# Patient Record
Sex: Female | Born: 1945 | Race: White | Hispanic: No | Marital: Married | Smoking: Former smoker
Health system: Southern US, Community
[De-identification: ages and names within clinical notes are randomized; demographics above are authoritative.]

## PROBLEM LIST (undated history)

## (undated) DIAGNOSIS — K219 Gastro-esophageal reflux disease without esophagitis: Secondary | ICD-10-CM

## (undated) DIAGNOSIS — Z972 Presence of dental prosthetic device (complete) (partial): Secondary | ICD-10-CM

## (undated) DIAGNOSIS — Z923 Personal history of irradiation: Secondary | ICD-10-CM

## (undated) DIAGNOSIS — R569 Unspecified convulsions: Secondary | ICD-10-CM

## (undated) DIAGNOSIS — J189 Pneumonia, unspecified organism: Secondary | ICD-10-CM

## (undated) DIAGNOSIS — M1990 Unspecified osteoarthritis, unspecified site: Secondary | ICD-10-CM

## (undated) DIAGNOSIS — C50919 Malignant neoplasm of unspecified site of unspecified female breast: Secondary | ICD-10-CM

## (undated) DIAGNOSIS — Z973 Presence of spectacles and contact lenses: Secondary | ICD-10-CM

## (undated) DIAGNOSIS — H409 Unspecified glaucoma: Secondary | ICD-10-CM

## (undated) HISTORY — PX: CHOLECYSTECTOMY: SHX55

## (undated) HISTORY — PX: BREAST BIOPSY: SHX20

## (undated) HISTORY — PX: COLONOSCOPY: SHX174

## (undated) HISTORY — PX: TONSILLECTOMY: SUR1361

## (undated) HISTORY — DX: Unspecified convulsions: R56.9

## (undated) HISTORY — PX: APPENDECTOMY: SHX54

## (undated) HISTORY — DX: Malignant neoplasm of unspecified site of unspecified female breast: C50.919

## (undated) HISTORY — PX: ABDOMINAL HYSTERECTOMY: SHX81

## (undated) HISTORY — DX: Unspecified osteoarthritis, unspecified site: M19.90

## (undated) HISTORY — PX: CARPAL TUNNEL RELEASE: SHX101

---

## 2013-04-12 DIAGNOSIS — J209 Acute bronchitis, unspecified: Secondary | ICD-10-CM | POA: Diagnosis not present

## 2013-04-12 DIAGNOSIS — Z79899 Other long term (current) drug therapy: Secondary | ICD-10-CM | POA: Diagnosis not present

## 2013-04-12 DIAGNOSIS — B9789 Other viral agents as the cause of diseases classified elsewhere: Secondary | ICD-10-CM | POA: Diagnosis not present

## 2013-04-12 DIAGNOSIS — M25569 Pain in unspecified knee: Secondary | ICD-10-CM | POA: Diagnosis not present

## 2013-04-12 DIAGNOSIS — M659 Synovitis and tenosynovitis, unspecified: Secondary | ICD-10-CM | POA: Diagnosis not present

## 2013-04-12 DIAGNOSIS — M1710 Unilateral primary osteoarthritis, unspecified knee: Secondary | ICD-10-CM | POA: Diagnosis not present

## 2013-07-20 DIAGNOSIS — J209 Acute bronchitis, unspecified: Secondary | ICD-10-CM | POA: Diagnosis not present

## 2013-07-23 DIAGNOSIS — G479 Sleep disorder, unspecified: Secondary | ICD-10-CM | POA: Diagnosis not present

## 2013-07-23 DIAGNOSIS — F3289 Other specified depressive episodes: Secondary | ICD-10-CM | POA: Diagnosis not present

## 2013-07-23 DIAGNOSIS — R569 Unspecified convulsions: Secondary | ICD-10-CM | POA: Diagnosis not present

## 2013-07-23 DIAGNOSIS — G40309 Generalized idiopathic epilepsy and epileptic syndromes, not intractable, without status epilepticus: Secondary | ICD-10-CM | POA: Diagnosis not present

## 2013-07-23 DIAGNOSIS — G5600 Carpal tunnel syndrome, unspecified upper limb: Secondary | ICD-10-CM | POA: Diagnosis not present

## 2013-07-23 DIAGNOSIS — F329 Major depressive disorder, single episode, unspecified: Secondary | ICD-10-CM | POA: Diagnosis not present

## 2013-07-26 DIAGNOSIS — H40019 Open angle with borderline findings, low risk, unspecified eye: Secondary | ICD-10-CM | POA: Diagnosis not present

## 2013-07-26 DIAGNOSIS — H2510 Age-related nuclear cataract, unspecified eye: Secondary | ICD-10-CM | POA: Diagnosis not present

## 2013-11-05 DIAGNOSIS — M1710 Unilateral primary osteoarthritis, unspecified knee: Secondary | ICD-10-CM | POA: Diagnosis not present

## 2013-11-05 DIAGNOSIS — M659 Synovitis and tenosynovitis, unspecified: Secondary | ICD-10-CM | POA: Diagnosis not present

## 2013-11-05 DIAGNOSIS — M25569 Pain in unspecified knee: Secondary | ICD-10-CM | POA: Diagnosis not present

## 2013-12-03 DIAGNOSIS — M6580 Other synovitis and tenosynovitis, unspecified site: Secondary | ICD-10-CM | POA: Diagnosis not present

## 2013-12-03 DIAGNOSIS — M25569 Pain in unspecified knee: Secondary | ICD-10-CM | POA: Diagnosis not present

## 2013-12-03 DIAGNOSIS — M1710 Unilateral primary osteoarthritis, unspecified knee: Secondary | ICD-10-CM | POA: Diagnosis not present

## 2013-12-26 DIAGNOSIS — Z1231 Encounter for screening mammogram for malignant neoplasm of breast: Secondary | ICD-10-CM | POA: Diagnosis not present

## 2013-12-26 DIAGNOSIS — Z01419 Encounter for gynecological examination (general) (routine) without abnormal findings: Secondary | ICD-10-CM | POA: Diagnosis not present

## 2013-12-26 DIAGNOSIS — Z23 Encounter for immunization: Secondary | ICD-10-CM | POA: Diagnosis not present

## 2013-12-26 DIAGNOSIS — H40013 Open angle with borderline findings, low risk, bilateral: Secondary | ICD-10-CM | POA: Diagnosis not present

## 2014-01-22 DIAGNOSIS — G479 Sleep disorder, unspecified: Secondary | ICD-10-CM | POA: Diagnosis not present

## 2014-01-22 DIAGNOSIS — D510 Vitamin B12 deficiency anemia due to intrinsic factor deficiency: Secondary | ICD-10-CM | POA: Diagnosis not present

## 2014-01-22 DIAGNOSIS — G40209 Localization-related (focal) (partial) symptomatic epilepsy and epileptic syndromes with complex partial seizures, not intractable, without status epilepticus: Secondary | ICD-10-CM | POA: Diagnosis not present

## 2014-01-22 DIAGNOSIS — M129 Arthropathy, unspecified: Secondary | ICD-10-CM | POA: Diagnosis not present

## 2014-02-03 DIAGNOSIS — G8929 Other chronic pain: Secondary | ICD-10-CM | POA: Diagnosis not present

## 2014-02-03 DIAGNOSIS — M25562 Pain in left knee: Secondary | ICD-10-CM | POA: Diagnosis not present

## 2014-02-03 DIAGNOSIS — M1712 Unilateral primary osteoarthritis, left knee: Secondary | ICD-10-CM | POA: Diagnosis not present

## 2014-02-03 DIAGNOSIS — M659 Synovitis and tenosynovitis, unspecified: Secondary | ICD-10-CM | POA: Diagnosis not present

## 2014-03-28 HISTORY — PX: BREAST LUMPECTOMY: SHX2

## 2014-04-11 DIAGNOSIS — M1711 Unilateral primary osteoarthritis, right knee: Secondary | ICD-10-CM | POA: Diagnosis not present

## 2014-04-11 DIAGNOSIS — M25569 Pain in unspecified knee: Secondary | ICD-10-CM | POA: Diagnosis not present

## 2014-04-11 DIAGNOSIS — M67361 Transient synovitis, right knee: Secondary | ICD-10-CM | POA: Diagnosis not present

## 2014-05-29 ENCOUNTER — Other Ambulatory Visit: Payer: Self-pay | Admitting: Specialist

## 2014-05-29 DIAGNOSIS — N6320 Unspecified lump in the left breast, unspecified quadrant: Secondary | ICD-10-CM

## 2014-05-29 DIAGNOSIS — Z803 Family history of malignant neoplasm of breast: Secondary | ICD-10-CM

## 2014-05-29 DIAGNOSIS — N6325 Unspecified lump in the left breast, overlapping quadrants: Secondary | ICD-10-CM

## 2014-05-29 DIAGNOSIS — N63 Unspecified lump in breast: Secondary | ICD-10-CM | POA: Diagnosis not present

## 2014-05-29 DIAGNOSIS — N6312 Unspecified lump in the right breast, upper inner quadrant: Secondary | ICD-10-CM

## 2014-06-04 DIAGNOSIS — H40013 Open angle with borderline findings, low risk, bilateral: Secondary | ICD-10-CM | POA: Diagnosis not present

## 2014-06-05 ENCOUNTER — Ambulatory Visit
Admission: RE | Admit: 2014-06-05 | Discharge: 2014-06-05 | Disposition: A | Discharge status: Home / Self Care | Payer: Medicare Other | Source: Ambulatory Visit | Attending: Specialist | Admitting: Specialist

## 2014-06-05 ENCOUNTER — Other Ambulatory Visit: Payer: Self-pay | Admitting: Specialist

## 2014-06-05 DIAGNOSIS — N6320 Unspecified lump in the left breast, unspecified quadrant: Secondary | ICD-10-CM

## 2014-06-05 DIAGNOSIS — Z803 Family history of malignant neoplasm of breast: Secondary | ICD-10-CM

## 2014-06-05 DIAGNOSIS — N6325 Unspecified lump in the left breast, overlapping quadrants: Secondary | ICD-10-CM

## 2014-06-05 DIAGNOSIS — N63 Unspecified lump in breast: Secondary | ICD-10-CM | POA: Diagnosis not present

## 2014-06-05 DIAGNOSIS — N6312 Unspecified lump in the right breast, upper inner quadrant: Secondary | ICD-10-CM

## 2014-06-05 DIAGNOSIS — C50911 Malignant neoplasm of unspecified site of right female breast: Secondary | ICD-10-CM | POA: Diagnosis not present

## 2014-06-11 ENCOUNTER — Telehealth: Payer: Self-pay | Admitting: *Deleted

## 2014-06-11 ENCOUNTER — Inpatient Hospital Stay: Admission: RE | Admit: 2014-06-11 | Payer: Medicare Other | Source: Ambulatory Visit

## 2014-06-11 DIAGNOSIS — C50211 Malignant neoplasm of upper-inner quadrant of right female breast: Secondary | ICD-10-CM | POA: Insufficient documentation

## 2014-06-11 NOTE — Telephone Encounter (Signed)
Confirmed [facility] for 06/18/14 at 8am .  Instructions and contact information given.

## 2014-06-17 NOTE — Progress Notes (Signed)
[place]  [phone] Fax:([phone]) [place] Note   Patient Care Team: Lonzo Cloud, MD as PCP - General (General Practice) Lonzo Cloud, MD (General Practice) Fanny Skates, MD as Consulting Physician (General Surgery) Truitt Merle, MD as Consulting Physician (Hematology) Thea Silversmith, MD as Consulting Physician (Radiation Oncology) Rockwell Germany, RN as Registered Nurse Mauro Kaufmann, RN as Registered Nurse Holley Bouche, NP as Nurse Practitioner (Nurse Practitioner) 06/19/2014  CHIEF COMPLAINTS/PURPOSE OF CONSULTATION:  Newly diagnosed cancer, likely breast primary    Breast cancer of upper-inner quadrant of right female breast   06/05/2014 Breast US ultrasound is performed, showing a 2.4 x 1.5 x 2.3 cm heterogeneous mass at the right breast 1 o'clock 15 cm from nipple palpable area correlating to the mammographic finding. Ultrasound of the right axilla is negative   06/05/2014 Pathology Results INVASIVE MAMMARY CARCINOMA   06/05/2014 Receptors her2 Estrogen Receptor: 0%, NEGATIVE Progesterone Receptor: 0%, NEGATIVE Proliferation Marker Ki67: 98%  HER-2/NEU BY CISH - NEGATIVE   06/11/2014 Initial Diagnosis Breast cancer of upper-inner quadrant of right female breast    HISTORY OF PRESENTING ILLNESS:  Kristin Pennington 69 y.o. female is here because of recently diagnosed poorly differentiated carcinoma in her right breast.  She found a lump in her right breast 3 weeks ago, mild tenderness,  No other complains. Last mammo was in oct 2015 which was negative. She feels well overall. She denies any pain, cough, shortness breast, GI symptoms. She has good energy level and appetite, no recent weight loss.  Her last colonoscopy was for 5 years ago which was negative per patient. Her last Pap smear was 3 years ago which was negative per patient.   MEDICAL HISTORY:  Past Medical History  Diagnosis Date  . Breast cancer   . Arthritis   .  Seizures      SURGICAL HISTORY: Past Surgical History  Procedure Laterality Date  . Appendectomy    . Cholecystectomy    . Abdominal hysterectomy    . Tonsillectomy      SOCIAL HISTORY: History   Social History  . Marital Status: Married    Spouse Name: N/A  . Number of Children: N/A  . Years of Education: N/A   Occupational History  . 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 Smoking status: Not on file  . Smokeless tobacco: Not on file  . Alcohol Use: Not on file  . Drug Use: Not on file  . Sexual Activity: Not on file   Other Topics Concern  . Not on file   Social History Narrative  . No narrative on file   GYN HISTORY  Menarchal: 11 LMP: 1994, hysrectomy  Contraceptive: no  HRT: since 1994  G2P1, one miscarriage     FAMILY HISTORY: Family History  Problem Relation Age of Onset  . Cancer Father 42    gastric cancer   . Cancer Sister 83    lung cancer     ALLERGIES:  is allergic to aspirin; codeine; diclofenac; meloxicam; nitrofurantoin; other; pyridium; sulfa antibiotics; tetracyclines & related; and tape.  MEDICATIONS:  Current Outpatient Prescriptions  Medication Sig Dispense Refill  . aspirin EC 81 MG tablet Take by mouth.    . cyanocobalamin (,VITAMIN B-12,) 1000 MCG/ML injection Inject into the muscle.    . estradiol (CLIMARA - DOSED IN MG/24 HR) 0.075 mg/24hr patch Place 1 patch onto the skin once a week.    Marland Kitchen  levETIRAcetam (KEPPRA) 500 MG tablet Take 1 tablet by mouth 2 (two) times daily.    Marland Kitchen oxaprozin (DAYPRO) 600 MG tablet Take 1 tablet by mouth 2 (two) times daily.    . phenytoin (DILANTIN) 100 MG ER capsule Take 1 tablet by mouth 4 (four) times daily. For 5 days a week    . phenytoin (DILANTIN) 100 MG ER capsule Take 100 mg by mouth 3 (three) times daily. For  2 days a week.     No current facility-administered medications for this visit.    REVIEW OF SYSTEMS:   Constitutional: Denies fevers, chills  or abnormal night sweats Eyes: Denies blurriness of vision, double vision or watery eyes Ears, nose, mouth, throat, and face: Denies mucositis or sore throat Respiratory: Denies cough, dyspnea or wheezes Cardiovascular: Denies palpitation, chest discomfort or lower extremity swelling Gastrointestinal:  Denies nausea, heartburn or change in bowel habits Skin: Denies abnormal skin rashes Lymphatics: Denies new lymphadenopathy or easy bruising Neurological:Denies numbness, tingling or new weaknesses Behavioral/Psych: Mood is stable, no new changes  All other systems were reviewed with the patient and are negative.  PHYSICAL EXAMINATION: ECOG PERFORMANCE STATUS: 0 - Asymptomatic  Filed Vitals:   06/18/14 0856  BP: 127/70  Pulse: 64  Temp: 97.9 F (36.6 C)  Resp: 18   Filed Weights   06/18/14 0856  Weight: 163 lb 1.6 oz (73.982 kg)    GENERAL:alert, no distress and comfortable SKIN: skin color, texture, turgor are normal, no rashes or significant lesions EYES: normal, conjunctiva are pink and non-injected, sclera clear OROPHARYNX:no exudate, no erythema and lips, buccal mucosa, and tongue normal  NECK: supple, thyroid normal size, non-tender, without nodularity LYMPH:  no palpable lymphadenopathy in the cervical, axillary or inguinal LUNGS: clear to auscultation and percussion with normal breathing effort HEART: regular rate & rhythm and no murmurs and no lower extremity edema ABDOMEN:abdomen soft, non-tender and normal bowel sounds Musculoskeletal:no cyanosis of digits and no clubbing  PSYCH: alert & oriented x 3 with fluent speech NEURO: no focal motor/sensory deficits Breasts: Breast inspection showed them to be symmetrical with no nipple discharge. There is a 2.5 cm x 4.5 cm mass in the 10 clock positionof her right breast, slightly tender. Palpation of the left breasts and axilla revealed no obvious mass that I could appreciate.   LABORATORY DATA:  I have reviewed the data  as listed Lab Results  Component Value Date   WBC 5.7 06/18/2014   HGB 12.9 06/18/2014   HCT 38.4 06/18/2014   MCV 96.5 06/18/2014   PLT 197 06/18/2014    Recent Labs  06/18/14 0814  NA 141  K 4.5  CO2 28  GLUCOSE 74  BUN 14.7  CREATININE 0.7  CALCIUM 8.9  PROT 6.9  ALBUMIN 3.4*  AST 17  ALT 19  ALKPHOS 105  BILITOT 0.21     PATHOLOGY REPORT 06/05/2014 Diagnosis Breast, right, needle core biopsy - INVASIVE MAMMARY CARCINOMA, SEE COMMENT. Microscopic Comment Although the grade of tumor is best assessed at resection, with these biopsies, the invasive tumor is grade III. The tumor expresses cytokeratin AE1/3 immunostain and is negative for S-100 and melanin A immunostains. Breast prognostic studies  Results: Estrogen Receptor: 0%, NEGATIVE Progesterone Receptor: 0%, NEGATIVE Proliferation Marker Ki67: 98% COMMENT: The negative hormone receptor study(ies) in this case have an internal positive control.  HER-2/NEU BY CISH - NEGATIVE. RESULT RATIO OF HER2: CEP 17 SIGNALS 1.31 AVERAGE HER2 COPY NUMBER PER CELL 2.10  RADIOGRAPHIC STUDIES: I have  personally reviewed the radiological images as listed and agreed with the findings in the report. Mm Digital Diagnostic Unilat R  06/05/2014   CLINICAL DATA:  Status post ultrasound-guided core biopsy right breast mass  EXAM: DIAGNOSTIC RIGHT MAMMOGRAM POST ULTRASOUND BIOPSY  COMPARISON:  Previous exam(s).  FINDINGS: Mammographic images were obtained following right breast ultrasound guided biopsy of mass at 1 o'clock. Lateral, CC, medial exaggerated right CC view are submitted. Heart shaped biopsy clip is identified within the mass of concern.  IMPRESSION: Post biopsy mammogram demonstrating biopsy clip in the mass of concern.  Final Assessment: Post Procedure Mammograms for Marker Placement   Electronically Signed   By: Abelardo Diesel M.D.   On: 06/05/2014 14:07   US Breast Ltd Uni Left Inc Axilla  06/05/2014   CLINICAL DATA:   Palpable lumps bilateral breasts.  EXAM: DIGITAL DIAGNOSTIC BILATERAL MAMMOGRAM WITH 3D TOMOSYNTHESIS WITH CAD  ULTRASOUND BILATERAL BREAST  COMPARISON:  December 26, 2013, October 15, 2012  ACR Breast Density Category b: There are scattered areas of fibroglandular density.  FINDINGS: Cc and MLO views of bilateral breasts, spot tangential view of right breast are submitted. There is a 2.9 cm mass in the palpable medial upper posterior right breast. No suspicious abnormalities identified within the left breast.  Mammographic images were processed with CAD.  Targeted ultrasound is performed, showing a 2.4 x 1.5 x 2.3 cm heterogeneous mass at the right breast 1 o'clock 15 cm from nipple palpable area correlating to the mammographic finding. Ultrasound of the right axilla is negative. Ultrasound of the left breast palpable area inframammary fold region demonstrates no focal abnormal discrete cystic or solid lesion.  IMPRESSION: Suspicious findings.  RECOMMENDATION: Ultrasound-guided core biopsy right breast mass.  I have discussed the findings and recommendations with the patient. Results were also provided in writing at the conclusion of the visit. If applicable, a reminder letter will be sent to the patient regarding the next appointment.  BI-RADS CATEGORY  4: Suspicious.   Electronically Signed   By: Abelardo Diesel M.D.   On: 06/05/2014 13:24   US Breast Ltd Uni Right Inc Axilla  06/09/2014   CLINICAL DATA:  Palpable lumps bilateral breasts.  EXAM: DIGITAL DIAGNOSTIC BILATERAL MAMMOGRAM WITH 3D TOMOSYNTHESIS WITH CAD  ULTRASOUND BILATERAL BREAST  COMPARISON:  December 26, 2013, October 15, 2012  ACR Breast Density Category b: There are scattered areas of fibroglandular density.  FINDINGS: Cc and MLO views of bilateral breasts, spot tangential view of right breast are submitted. There is a 2.9 cm mass in the palpable medial upper posterior right breast. No suspicious abnormalities identified within the left breast.   Mammographic images were processed with CAD.  Targeted ultrasound is performed, showing a 2.4 x 1.5 x 2.3 cm heterogeneous mass at the right breast 1 o'clock 15 cm from nipple palpable area correlating to the mammographic finding. Ultrasound of the right axilla is negative. Ultrasound of the left breast palpable area inframammary fold region demonstrates no focal abnormal discrete cystic or solid lesion.  IMPRESSION: Suspicious findings.  RECOMMENDATION: Ultrasound-guided core biopsy right breast mass.  I have discussed the findings and recommendations with the patient. Results were also provided in writing at the conclusion of the visit. If applicable, a reminder letter will be sent to the patient regarding the next appointment.  BI-RADS CATEGORY  4: Suspicious.   Electronically Signed   By: Abelardo Diesel M.D.   On: 06/05/2014 13:24   Mm Diag Breast Tomo Bilateral  06/05/2014  CLINICAL DATA:  Palpable lumps bilateral breasts.  EXAM: DIGITAL DIAGNOSTIC BILATERAL MAMMOGRAM WITH 3D TOMOSYNTHESIS WITH CAD  ULTRASOUND BILATERAL BREAST  COMPARISON:  December 26, 2013, October 15, 2012  ACR Breast Density Category b: There are scattered areas of fibroglandular density.  FINDINGS: Cc and MLO views of bilateral breasts, spot tangential view of right breast are submitted. There is a 2.9 cm mass in the palpable medial upper posterior right breast. No suspicious abnormalities identified within the left breast.  Mammographic images were processed with CAD.  Targeted ultrasound is performed, showing a 2.4 x 1.5 x 2.3 cm heterogeneous mass at the right breast 1 o'clock 15 cm from nipple palpable area correlating to the mammographic finding. Ultrasound of the right axilla is negative. Ultrasound of the left breast palpable area inframammary fold region demonstrates no focal abnormal discrete cystic or solid lesion.  IMPRESSION: Suspicious findings.  RECOMMENDATION: Ultrasound-guided core biopsy right breast mass.  I have discussed  the findings and recommendations with the patient. Results were also provided in writing at the conclusion of the visit. If applicable, a reminder letter will be sent to the patient regarding the next appointment.  BI-RADS CATEGORY  4: Suspicious.   Electronically Signed   By: Abelardo Diesel M.D.   On: 06/05/2014 13:24   [place] Rt Breast Bx W Loc Dev 1st Lesion Img Bx Spec US Guide  06/09/2014   ADDENDUM REPORT: 06/09/2014 16:43  ADDENDUM: Final pathology demonstrates INVASIVE MAMMARY CARCINOMA.  Histology correlates with imaging findings.  The patient was contacted by phone on 06/09/2014 and these results given to her which she understood. Her questions were answered.  The patient had no complaints with her biopsy site.  Recommend surgery/oncology consultation. The patient strongly desires treatment in [place] and these appointments will be arranged.   Electronically Signed   By: Margarette Canada M.D.   On: 06/09/2014 16:43   06/09/2014   CLINICAL DATA:  Palpable lump right breast  EXAM: ULTRASOUND GUIDED RIGHT BREAST CORE NEEDLE BIOPSY  COMPARISON:  Previous exam(s).  FINDINGS: I met with the patient and we discussed the procedure of ultrasound-guided biopsy, including benefits and alternatives. We discussed the high likelihood of a successful procedure. We discussed the risks of the procedure, including infection, bleeding, tissue injury, clip migration, and inadequate sampling. Informed written consent was given. The usual time-out protocol was performed immediately prior to the procedure.  Using sterile technique and 2% Lidocaine as local anesthetic, under direct ultrasound visualization, a 14 gauge spring-loaded device was used to perform biopsy of mass at the right breast 1 o'clock using a medial approach. At the conclusion of the procedure a heart shaped tissue marker clip was deployed into the biopsy cavity. Follow up 2 view mammogram was performed and dictated separately.  IMPRESSION: Ultrasound guided biopsy  of right breast. No apparent complications.  Electronically Signed: By: Abelardo Diesel M.D. On: 06/05/2014 14:02    ASSESSMENT & PLAN:  69 year old postmenopausal woman, with past medical history of seizure and arthritis, presented with a palpable mass in the inner upper quadrant of her right breast. Biopsy reviewed per differentiated carcinoma, triple negative.  1. 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  -this is likely a primary triple negative breast cancer, however other primary is not ruled out. I would like to obtain a PET scan for further evaluation to ruled out other primary or  metastasis. -We'll also obtain a breast MRI for further evaluation, without other lesions in breasts. -If her PET scan is negative for other primary or distant metastasis, giving the size of her tumor, triple negative, very high Ki67, this is likely up very aggressive tumor. I recommended neoadjuvant chemotherapy with AC-T.  -we reviewed the natural history of triple negative breast cancer. If PET scan is negative for distant metastasis, there is a very good chance that her cancer will be cured by multidisciplinary approach, which includes surgery, chemotherapy and radiation therapy.  -she was seen by surgeon Dr. Dalbert Batman, and a radiation oncologist doctor Pablo Ledger today. -I plan to see her back after the PET scan to review the results and prepare for the chemotherapy.  Orders Placed This Encounter  Procedures  . NM PET Image Initial (PI) Skull Base To Thigh    Standing Status: Future     Number of Occurrences:      Standing Expiration Date: 06/18/2015    Order Specific Question:  Reason for exam:    Answer:  Staging    Order Specific Question:  Preferred imaging location?    Answer:  [facility]  . MR Breast Bilateral W Wo Contrast    EPIC ORDER PF: 06/05/2014 [facility]    *CALL DON [phone] W/ APPT DATE WHEN SCHEDULED CR   PT IS POST MENOPAUSAL/162LBS/NO NEEDS/NOT CLAUS/NO SURGERY/NO METALS/NO PACEMAKER/NO DEFIBRILLATOR/NO NEUROSTIMULATOR/NO STENTS/NO IMPLANTS/PT HAS BREAST CANCER/NO KIDNEY OR LIVER DISEASE/NO LUPUS/NO SCLERODERMA/PT HAS ARTHRITIS/PT IS ON NSAIDS/NO ANTIBIOTICS/NO HYPERTENSION/NO DIABETES/LABS DRAWN ON 06/18/14-RESULTS IN EPIC/NOT ALLERGIC TO CONTRAST/INS/MUTUAL OF [place]    Standing Status: Future     Number of Occurrences:      Standing Expiration Date: 08/18/2015    Order Specific Question:  Reason for Exam (SYMPTOM  OR DIAGNOSIS REQUIRED)    Answer:  recently dx with breast cancer    Order Specific Question:  Preferred imaging location?    Answer:  [facility]    Order Specific Question:  Does the patient have a pacemaker or implanted devices?    Answer:  No    Order Specific Question:  What is the patient's sedation requirement?    Answer:  No Sedation    Order Specific Question:  Call Report- Best Contact Number?    Answer:  Dr. Fanny Skates- [phone]  . 2D Echocardiogram without contrast    Standing Status: Future     Number of Occurrences:      Standing Expiration Date: 06/18/2015    Order Specific Question:  Type of Echo    Answer:  Complete    Order Specific Question:  Reason for Exam    Answer:  Evaluate EF    Order Specific Question:  Where should this test be performed    Answer:  [facility]  -chemo class -port placement  --RTC in 2 weeks    All questions were answered. The patient knows to call the clinic with any problems, questions or concerns. I spent 55 minutes counseling the patient face to face. The total time spent in the appointment was 60 minutes and more than 50% was on counseling.     Truitt Merle, MD 06/19/2014 6:11 PM

## 2014-06-18 ENCOUNTER — Encounter: Payer: Self-pay | Admitting: Physical Therapy

## 2014-06-18 ENCOUNTER — Encounter: Payer: Self-pay | Admitting: Hematology

## 2014-06-18 ENCOUNTER — Ambulatory Visit
Admission: RE | Admit: 2014-06-18 | Discharge: 2014-06-18 | Disposition: A | Discharge status: Home / Self Care | Payer: Medicare Other | Source: Ambulatory Visit | Attending: Radiation Oncology | Admitting: Radiation Oncology

## 2014-06-18 ENCOUNTER — Other Ambulatory Visit: Payer: Self-pay | Admitting: General Surgery

## 2014-06-18 ENCOUNTER — Ambulatory Visit (HOSPITAL_BASED_OUTPATIENT_CLINIC_OR_DEPARTMENT_OTHER): Payer: Medicare Other | Admitting: Hematology

## 2014-06-18 ENCOUNTER — Other Ambulatory Visit: Payer: Self-pay | Admitting: Hematology

## 2014-06-18 ENCOUNTER — Ambulatory Visit: Payer: Medicare Other | Attending: General Surgery | Admitting: Physical Therapy

## 2014-06-18 ENCOUNTER — Ambulatory Visit: Payer: Medicare Other

## 2014-06-18 ENCOUNTER — Encounter: Payer: Self-pay | Admitting: Skilled Nursing Facility1

## 2014-06-18 ENCOUNTER — Other Ambulatory Visit (HOSPITAL_BASED_OUTPATIENT_CLINIC_OR_DEPARTMENT_OTHER): Payer: Medicare Other

## 2014-06-18 ENCOUNTER — Encounter: Payer: Self-pay | Admitting: *Deleted

## 2014-06-18 ENCOUNTER — Telehealth: Payer: Self-pay | Admitting: Hematology

## 2014-06-18 VITALS — BP 127/70 | HR 64 | Temp 97.9°F | Resp 18 | Ht 64.0 in | Wt 163.1 lb

## 2014-06-18 DIAGNOSIS — Z9079 Acquired absence of other genital organ(s): Secondary | ICD-10-CM | POA: Diagnosis not present

## 2014-06-18 DIAGNOSIS — Z8669 Personal history of other diseases of the nervous system and sense organs: Secondary | ICD-10-CM | POA: Diagnosis not present

## 2014-06-18 DIAGNOSIS — Z9049 Acquired absence of other specified parts of digestive tract: Secondary | ICD-10-CM | POA: Diagnosis not present

## 2014-06-18 DIAGNOSIS — C50211 Malignant neoplasm of upper-inner quadrant of right female breast: Secondary | ICD-10-CM

## 2014-06-18 DIAGNOSIS — C801 Malignant (primary) neoplasm, unspecified: Secondary | ICD-10-CM

## 2014-06-18 DIAGNOSIS — C50411 Malignant neoplasm of upper-outer quadrant of right female breast: Secondary | ICD-10-CM

## 2014-06-18 DIAGNOSIS — Z90722 Acquired absence of ovaries, bilateral: Secondary | ICD-10-CM | POA: Diagnosis not present

## 2014-06-18 DIAGNOSIS — R293 Abnormal posture: Secondary | ICD-10-CM | POA: Diagnosis not present

## 2014-06-18 DIAGNOSIS — Z90710 Acquired absence of both cervix and uterus: Secondary | ICD-10-CM | POA: Diagnosis not present

## 2014-06-18 LAB — COMPREHENSIVE METABOLIC PANEL (CC13)
ALT: 19 U/L (ref 0–55)
AST: 17 U/L (ref 5–34)
Albumin: 3.4 g/dL — ABNORMAL LOW (ref 3.5–5.0)
Alkaline Phosphatase: 105 U/L (ref 40–150)
Anion Gap: 8 mEq/L (ref 3–11)
BUN: 14.7 mg/dL (ref 7.0–26.0)
CO2: 28 mEq/L (ref 22–29)
Calcium: 8.9 mg/dL (ref 8.4–10.4)
Chloride: 105 mEq/L (ref 98–109)
Creatinine: 0.7 mg/dL (ref 0.6–1.1)
EGFR: 85 mL/min/{1.73_m2} — ABNORMAL LOW (ref >90)
Glucose: 74 mg/dl (ref 70–140)
Potassium: 4.5 mEq/L (ref 3.5–5.1)
Sodium: 141 mEq/L (ref 136–145)
Total Bilirubin: 0.21 mg/dL (ref 0.20–1.20)
Total Protein: 6.9 g/dL (ref 6.4–8.3)

## 2014-06-18 LAB — CBC WITH DIFFERENTIAL/PLATELET
BASO%: 1.6 % (ref 0.0–2.0)
Basophils Absolute: 0.1 10*3/uL (ref 0.0–0.1)
EOS%: 1.9 % (ref 0.0–7.0)
Eosinophils Absolute: 0.1 10*3/uL (ref 0.0–0.5)
HCT: 38.4 % (ref 34.8–46.6)
HGB: 12.9 g/dL (ref 11.6–15.9)
LYMPH%: 41.9 % (ref 14.0–49.7)
MCH: 32.4 pg (ref 25.1–34.0)
MCHC: 33.6 g/dL (ref 31.5–36.0)
MCV: 96.5 fL (ref 79.5–101.0)
MONO#: 0.7 10*3/uL (ref 0.1–0.9)
MONO%: 12.5 % (ref 0.0–14.0)
NEUT#: 2.4 10*3/uL (ref 1.5–6.5)
NEUT%: 42.1 % (ref 38.4–76.8)
Platelets: 197 10*3/uL (ref 145–400)
RBC: 3.98 10*6/uL (ref 3.70–5.45)
RDW: 13.2 % (ref 11.2–14.5)
WBC: 5.7 10*3/uL (ref 3.9–10.3)
lymph#: 2.4 10*3/uL (ref 0.9–3.3)

## 2014-06-18 NOTE — Progress Notes (Signed)
Checked in new pt with no financial concerns at this time.  Pt has 2 insurances so financial assistance may not be needed but she has Raquel's card for any billing questions or concerns.  ° °

## 2014-06-18 NOTE — Progress Notes (Signed)
  Radiation Oncology         [phone]) [phone] ________________________________  Initial outpatient Consultation - Date: 06/18/2014   Name: Kristin Pennington MRN: [id number]   DOB: 1946-02-27  REFERRING PHYSICIAN: Fanny Skates, MD  DIAGNOSIS:    ICD-9-CM ICD-10-CM   1. Breast cancer of upper-inner quadrant of right female breast 174.2 C50.211     STAGE: T2N0 Invasive Ductal Carcinoma  HISTORY OF PRESENT ILLNESS::Kristin Pennington is a 69 y.o. female  Who presented with palpable right breast mass. This measured 2.4 x 1.5 x 2.3 cm on ultrasound.  She had a biopsy which showed high grade invasive carcinoma which was triple negative.  The origin of the cancer was not clear. She is GXP1 with her first birth at 59. She is post menopausal and is still taking estrogen replacement.  She is feeling well from her biopsy. She is accompanied by her son and daughter in law. She has no breast related complaints. She has no weight loss or abnormal fatigue.  She does live in [place], [place] and will be commuting here. She is interested in possibly receiving her radiation in [place].   PREVIOUS RADIATION THERAPY: No  Past medical, social and family history were reviewed in the electronic chart. Review of symptoms was reviewed in the electronic chart. Medications were reviewed in the electronic chart.   PHYSICAL EXAM:  .   IMPRESSION: T2N0 right (presumed) breast cancer  PLAN: We discussed a PET scan to rule out other sources of malignancy.  We discussed MRI to document her extent of disease and the use of neoadjuvant therapy to increase the likelihood of breast conservation. We discussed the role of radiation and decreasing local failures in patients who undergo lumpectomy. For this reason I have recommended radiation to the whole breast followed by boost to the tumor bed following neoadjuvant therapy and surgery. We discussed the process of simulation the placement tattoos. We discussed possible side effects during  treatment including but not limited to skin irritation darkness and fatigue. We discussed long-term effects of treatment which are extremely unlikely but possible including damage to the lungs and ribs. We discussed the low likelihood of secondary malignancies.   We discussed 6 weeks of treatment as an outpatient.   She was seen and evaluated by physical therapy, our dietician, our breast cancer navigators, social work and our Building services engineer.  She needs to d/c her estrogen immediately.   I will see her back after surgery and we can discuss treatment here vs. [place]. Given her right sided cancer, [facility] will probably be an OK option for her.    I spent 40 minutes  face to face with the patient and more than 50% of that time was spent in counseling and/or coordination of care.   ------------------------------------------------  Thea Silversmith, MD

## 2014-06-18 NOTE — Progress Notes (Signed)
Kristin Pennington is a very pleasant 69 y.o. female from [place], [place] with newly diagnosed high grade invasive carcinoma, suspicious for a mammary primary.  Biopsy results revealed the tumor's hormone status as ER negative, PR negative, and HER2/neu negative. Ki67 is 98%.  She presents today with her husband and daughter-in-law to the [facility]) for treatment consideration and recommendations from the breast surgeon, radiation oncologist, and medical oncologist.     I briefly met with Kristin Pennington and her family during her [facility] visit today. We discussed the purpose of the Survivorship Clinic, which will include monitoring for recurrence, coordinating completion of age and gender-appropriate cancer screenings, promotion of overall wellness, as well as managing potential late/long-term side effects of anti-cancer treatments.    The treatment plan for Kristin Pennington pending given additional work-up with a PET scan and MRI.  Her treatment plan will likely include surgery, chemotherapy, and radiation therapy.  As of today, the intent of treatment for Kristin Pennington is cure, therefore she will be eligible for the Survivorship Clinic upon her completion of treatment.  Her survivorship care plan (SCP) document will be drafted and updated throughout the course of her treatment trajectory. She will receive the SCP in an office visit with myself in the Survivorship Clinic once she has completed treatment.   Kristin Pennington was encouraged to ask questions and all questions were answered to her satisfaction.  She was given my business card and encouraged to contact me with any concerns regarding survivorship.  I look forward to participating in her care.   Mike Craze, NP [place] [phone]

## 2014-06-18 NOTE — Telephone Encounter (Signed)
Appointments made and echo to [place] to precert,pt will need to call [facility] for mri and clamensia is aware that all she needs is her avs printed if she comes to [place] [facility]

## 2014-06-18 NOTE — Progress Notes (Signed)
Subjective:     Patient ID: Kristin Pennington, female   DOB: [birth year], 69 y.o.   MRN: [id number]  HPI   Review of Systems     Objective:   Physical Exam For the patient to understand and be given the tools to implement a healthy plant based diet during their cancer diagnosis.     Assessment:     Patient was seen today and found to be in good spirits and was accompanied by her boyfriend and daughter which were seemingly supportive. Pts right breast is afflicted. Current/relevant medications: Calcium +vitamin D, and cyanocobalamin. Pt has had gallbladder and appendix surgeries. Pt is 5'4'' and 163 pounds BMI 28.1.          Plan:     The importance of legitimate, evidence based information was discussed and examples were given. A folder of evidence based information with a focus on a plant based diet and general nutrition during cancer was given to the patient.  Dietitian educated the patient on implementing a plant based diet by incorporating more plant proteins, fruits, and vegetables. As a part of a healthy routine physical activity was discussed. As a part of the continuum of care the cancer dietitian's contact information was given to the patient in the event they would like to have a follow up appointment.

## 2014-06-18 NOTE — Progress Notes (Signed)
Clinical Social Work [place] Psychosocial Distress Screening [place]  Patient completed distress screening protocol and scored a 4 on the Psychosocial Distress Thermometer which indicates mild distress. Clinical Social Worker met with patient and patients family in [facility] to assess for distress and other psychosocial needs. Patient stated she was feeling slightly overwhelmed but felt comfortable in her treatment plan and treatment team. CSW and patient discussed common feeling and emotions when being diagnosed with cancer. CSW and patient discussed the importance of support during treatment, and CSW informed patient of the support team and support services at [facility]. Patient stated she had a strong support system in her family and friends. CSW provided contact information and encouraged patient to call with any questions or concerns.   ONCBCN DISTRESS SCREENING 06/18/2014  Screening Type Initial Screening  Distress experienced in past week (1-10) 4  Emotional problem type Nervousness/Anxiety;Adjusting to illness  Physical Problem type Sleep/insomnia  Physician notified of physical symptoms Yes  Referral to clinical psychology No  Referral to clinical social work No  Referral to dietition No  Referral to financial advocate No  Referral to support programs No  Referral to palliative care No   Johnnye Lana, MSW, LCSW, OSW-C Clinical Social Worker [place] [phone]

## 2014-06-18 NOTE — Patient Instructions (Signed)

## 2014-06-18 NOTE — Therapy (Signed)
[address] [place], [place], [address] Phone: [phone]   Fax:  [phone]  Physical Therapy Evaluation  Patient Details  Name: Kristin Pennington MRN: [id number] Date of Birth: August 31, 1945 Referring Provider:  Fanny Skates, MD  Encounter Date: 06/18/2014      PT End of Session - 06/18/14 1139    Visit Number 1   Number of Visits 1   PT Start Time 0940   PT Stop Time 1000  Also saw her from 1110-1120   PT Time Calculation (min) 20 min   Activity Tolerance Patient tolerated treatment well   Behavior During Therapy [facility] for tasks assessed/performed      Past Medical History  Diagnosis Date  . Breast cancer   . Arthritis   . Seizures     Past Surgical History  Procedure Laterality Date  . Appendectomy    . Cholecystectomy    . Abdominal hysterectomy    . Tonsillectomy      There were no vitals filed for this visit.  Visit Diagnosis:  Breast cancer of upper-outer quadrant of right female breast - Plan: PT plan of care cert/re-cert  Abnormal posture - Plan: PT plan of care cert/re-cert      Subjective Assessment - 06/18/14 1127    Symptoms Patient was seen today for a baseline assessment of her newly diagnosed right breast cancer.   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 It is located in the upper outer quadrant and measures 2.4 cm.   Patient Stated Goals Learn post op shoulder ROM HEP and lymphedema risk reduction.   Currently in Pain? Yes   Pain Score 10-Worst pain ever  Pain varies 0-10/10 depending on weather; 0/10 today   Pain Location Knee   Pain Orientation Left   Pain Descriptors / Indicators Sharp;Stabbing;Aching   Pain Type Chronic pain   Pain Onset More than a month ago   Pain Frequency Intermittent   Aggravating Factors  Rainy weather   Pain Relieving Factors Unknown   Multiple Pain Sites No            [facility] PT Assessment -  06/18/14 0001    Assessment   Medical Diagnosis Right breast cancer   Onset Date 06/12/14   Precautions   Precautions Other (comment)  Active cancer   Precaution Comments Also has seizures but are controlled by medications   Restrictions   Weight Bearing Restrictions No   Balance Screen   Has the patient fallen in the past 6 months No  Reports falls at times when she has a seizure   Has the patient had a decrease in activity level because of a fear of falling?  No   Is the patient reluctant to leave their home because of a fear of falling?  No   Home Environment   Living Enviornment Private residence   Living Arrangements Spouse/significant other   Available Help at Discharge Family   Prior Function   Level of Independence Independent with basic ADLs   Vocation Retired   Leisure She does not exercise   Cognition   Overall Cognitive Status Within Functional Limits for tasks assessed   Posture/Postural Control   Posture/Postural Control Postural limitations   Postural Limitations Rounded Shoulders;Forward head   ROM / Strength   AROM / PROM / Strength AROM;Strength   AROM   AROM Assessment Site Shoulder   Right/Left Shoulder Right;Left   Right Shoulder Extension 48 Degrees  Right Shoulder Flexion 147 Degrees   Right Shoulder ABduction 146 Degrees   Right Shoulder Internal Rotation 61 Degrees   Right Shoulder External Rotation 86 Degrees   Left Shoulder Extension 56 Degrees   Left Shoulder Flexion 149 Degrees   Left Shoulder ABduction 152 Degrees   Left Shoulder Internal Rotation 78 Degrees   Left Shoulder External Rotation 87 Degrees   Strength   Overall Strength Within functional limits for tasks performed           LYMPHEDEMA/ONCOLOGY QUESTIONNAIRE - 06/18/14 1136    Type   Cancer Type Right breast   Lymphedema Assessments   Lymphedema Assessments Upper extremities   Right Upper Extremity Lymphedema   10 cm Proximal to Olecranon Process 27.4 cm   Olecranon  Process 24.4 cm   10 cm Proximal to Ulnar Styloid Process 18.8 cm   Just Proximal to Ulnar Styloid Process 14.2 cm   Across Hand at [employer] 17.2 cm   At [place] of 2nd Digit 6.3 cm   Left Upper Extremity Lymphedema   10 cm Proximal to Olecranon Process 28.2 cm   Olecranon Process 24.5 cm   10 cm Proximal to Ulnar Styloid Process 19.3 cm   Just Proximal to Ulnar Styloid Process 14.6 cm   Across Hand at [employer] 17.2 cm   At [place] of 2nd Digit 6 cm       Patient was instructed today in a home exercise program today for post op shoulder range of motion. These included active assist shoulder flexion in sitting, scapular retraction, wall walking with shoulder abduction, and hands behind head external rotation.  She was encouraged to do these twice a day, holding 3 seconds and repeating 5 times when permitted by her physician.         PT Education - 06/18/14 1138    Education provided Yes   Education Details Post op shoudler ROM HEP and lymphedema risk reduction   Person(s) Educated Patient   Methods Explanation;Demonstration;Handout   Comprehension Verbalized understanding              Breast Clinic Goals - 06/18/14 1234    Patient will be able to verbalize understanding of pertinent lymphedema risk reduction practices relevant to her diagnosis specifically related to skin care.   Time 1   Period Days   Status Achieved   Patient will be able to return demonstrate and/or verbalize understanding of the post-op home exercise program related to regaining shoulder range of motion.   Time 1   Period Days   Status Achieved   Patient will be able to verbalize understanding of the importance of attending the postoperative After Breast Cancer Class for further lymphedema risk reduction education and therapeutic exercise.   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  She is planning to have neoadjuvant chemotherapy followed by a right lumpectomy and sentinel node biopsy and radiation.  She will likely benefit from physical therapy following surgery to regain shoulder ROM and strength and prevent lymphedema.   Pt will benefit from skilled therapeutic intervention in order to improve on the following deficits Decreased range of motion;Increased edema;Decreased knowledge of precautions;Pain;Impaired UE functional use;Decreased strength   Rehab Potential Good   Clinical  Impairments Affecting Rehab Potential none   PT Frequency One time visit   PT Treatment/Interventions Patient/family education;Therapeutic exercise   Consulted and Agree with Plan of Care Patient;Family member/caregiver   Family Member Consulted Husband, daughter-in-law     Patient will follow up at outpatient cancer rehab if needed following surgery.  If the patient requires physical therapy at that time, a specific plan will be dictated and sent to the referring physician for approval. The patient was educated today on appropriate basic range of motion exercises to begin post operatively and the importance of attending the After Breast Cancer class following surgery.  Patient was educated today on lymphedema risk reduction practices as it pertains to recommendations that will benefit the patient immediately following surgery.  She verbalized good understanding.  No additional physical therapy is indicated at this time.         G-Codes - 09-Jul-2014 1235    Functional Assessment Tool Used Clinical Judgement   Functional Limitation Other PT primary   Other PT Primary Current Status (X8329) At least 1 percent but less than 20 percent impaired, limited or restricted   Other PT Primary Goal Status (V9166) At least 1 percent but less than 20 percent impaired, limited or restricted   Other PT Primary Discharge Status (M6004)  At least 1 percent but less than 20 percent impaired, limited or restricted       Problem List Patient Active Problem List   Diagnosis Date Noted  . Breast cancer of upper-inner quadrant of right female breast 06/11/2014    Annia Friendly, PT 07/09/2014, 12:39 PM  [place], [place], [address] Phone: [phone]   Fax:  [phone]

## 2014-06-19 ENCOUNTER — Encounter: Payer: Self-pay | Admitting: Hematology

## 2014-06-23 ENCOUNTER — Encounter (HOSPITAL_BASED_OUTPATIENT_CLINIC_OR_DEPARTMENT_OTHER): Payer: Self-pay | Admitting: *Deleted

## 2014-06-23 ENCOUNTER — Encounter: Payer: Self-pay | Admitting: *Deleted

## 2014-06-23 NOTE — Progress Notes (Signed)
Labs done 06/18/14- Dr Dalbert Batman does want cxr-pt coming to [place] 3/30 for echo-then will come by here

## 2014-06-24 ENCOUNTER — Telehealth: Payer: Self-pay | Admitting: Hematology

## 2014-06-24 ENCOUNTER — Ambulatory Visit
Admission: RE | Admit: 2014-06-24 | Discharge: 2014-06-24 | Disposition: A | Discharge status: Home / Self Care | Payer: Medicare Other | Source: Ambulatory Visit | Attending: General Surgery | Admitting: General Surgery

## 2014-06-24 ENCOUNTER — Telehealth: Payer: Self-pay | Admitting: *Deleted

## 2014-06-24 DIAGNOSIS — C50211 Malignant neoplasm of upper-inner quadrant of right female breast: Secondary | ICD-10-CM | POA: Diagnosis not present

## 2014-06-24 DIAGNOSIS — C50412 Malignant neoplasm of upper-outer quadrant of left female breast: Secondary | ICD-10-CM | POA: Diagnosis not present

## 2014-06-24 MED ORDER — GADOBENATE DIMEGLUMINE 529 MG/ML IV SOLN
14.0000 mL | Freq: Once | INTRAVENOUS | Status: AC | PRN
Start: 2014-06-24 — End: 2014-06-24
  Administered 2014-06-24: 14 mL via INTRAVENOUS

## 2014-06-24 NOTE — Telephone Encounter (Signed)
left voicemail for pt regarding to March and April appts...Marland KitchenMarland Kitchen

## 2014-06-24 NOTE — Telephone Encounter (Signed)
Left vm for pt to return call to discuss bmdc from 06/18/14.

## 2014-06-25 ENCOUNTER — Ambulatory Visit
Admission: RE | Admit: 2014-06-25 | Discharge: 2014-06-25 | Disposition: A | Discharge status: Home / Self Care | Payer: Medicare Other | Source: Ambulatory Visit | Attending: General Surgery | Admitting: General Surgery

## 2014-06-25 ENCOUNTER — Telehealth: Payer: Self-pay | Admitting: *Deleted

## 2014-06-25 ENCOUNTER — Encounter (HOSPITAL_BASED_OUTPATIENT_CLINIC_OR_DEPARTMENT_OTHER)
Admission: RE | Admit: 2014-06-25 | Discharge: 2014-06-25 | Disposition: A | Discharge status: Home / Self Care | Payer: Medicare Other | Source: Ambulatory Visit | Attending: General Surgery | Admitting: General Surgery

## 2014-06-25 ENCOUNTER — Ambulatory Visit (HOSPITAL_COMMUNITY)
Admission: RE | Admit: 2014-06-25 | Discharge: 2014-06-25 | Disposition: A | Discharge status: Home / Self Care | Payer: Medicare Other | Source: Ambulatory Visit | Attending: Hematology | Admitting: Hematology

## 2014-06-25 ENCOUNTER — Other Ambulatory Visit: Payer: Medicare Other

## 2014-06-25 ENCOUNTER — Other Ambulatory Visit: Payer: Self-pay | Admitting: General Surgery

## 2014-06-25 DIAGNOSIS — C50919 Malignant neoplasm of unspecified site of unspecified female breast: Secondary | ICD-10-CM | POA: Insufficient documentation

## 2014-06-25 DIAGNOSIS — Z853 Personal history of malignant neoplasm of breast: Secondary | ICD-10-CM | POA: Diagnosis not present

## 2014-06-25 DIAGNOSIS — R928 Other abnormal and inconclusive findings on diagnostic imaging of breast: Secondary | ICD-10-CM

## 2014-06-25 DIAGNOSIS — Z01812 Encounter for preprocedural laboratory examination: Secondary | ICD-10-CM | POA: Insufficient documentation

## 2014-06-25 DIAGNOSIS — C50211 Malignant neoplasm of upper-inner quadrant of right female breast: Secondary | ICD-10-CM

## 2014-06-25 DIAGNOSIS — Z01818 Encounter for other preprocedural examination: Secondary | ICD-10-CM | POA: Diagnosis not present

## 2014-06-25 DIAGNOSIS — M5134 Other intervertebral disc degeneration, thoracic region: Secondary | ICD-10-CM | POA: Diagnosis not present

## 2014-06-25 DIAGNOSIS — Z5111 Encounter for antineoplastic chemotherapy: Secondary | ICD-10-CM | POA: Diagnosis not present

## 2014-06-25 DIAGNOSIS — Z87891 Personal history of nicotine dependence: Secondary | ICD-10-CM | POA: Diagnosis not present

## 2014-06-25 LAB — CBC WITH DIFFERENTIAL/PLATELET
Basophils Absolute: 0.1 10*3/uL (ref 0.0–0.1)
Basophils Relative: 1 % (ref 0–1)
Eosinophils Absolute: 0.2 10*3/uL (ref 0.0–0.7)
Eosinophils Relative: 3 % (ref 0–5)
HCT: 36.5 % (ref 36.0–46.0)
Hemoglobin: 12.4 g/dL (ref 12.0–15.0)
Lymphocytes Relative: 40 % (ref 12–46)
Lymphs Abs: 1.9 10*3/uL (ref 0.7–4.0)
MCH: 32.3 pg (ref 26.0–34.0)
MCHC: 34 g/dL (ref 30.0–36.0)
MCV: 95.1 fL (ref 78.0–100.0)
Monocytes Absolute: 0.5 10*3/uL (ref 0.1–1.0)
Monocytes Relative: 10 % (ref 3–12)
Neutro Abs: 2.2 10*3/uL (ref 1.7–7.7)
Neutrophils Relative %: 46 % (ref 43–77)
Platelets: 196 10*3/uL (ref 150–400)
RBC: 3.84 MIL/uL — ABNORMAL LOW (ref 3.87–5.11)
RDW: 13.2 % (ref 11.5–15.5)
WBC: 4.8 10*3/uL (ref 4.0–10.5)

## 2014-06-25 LAB — COMPREHENSIVE METABOLIC PANEL
ALT: 17 U/L (ref 0–35)
AST: 22 U/L (ref 0–37)
Albumin: 3.6 g/dL (ref 3.5–5.2)
Alkaline Phosphatase: 98 U/L (ref 39–117)
Anion gap: 10 (ref 5–15)
BUN: 16 mg/dL (ref 6–23)
CO2: 26 mmol/L (ref 19–32)
Calcium: 9 mg/dL (ref 8.4–10.5)
Chloride: 103 mmol/L (ref 96–112)
Creatinine, Ser: 0.67 mg/dL (ref 0.50–1.10)
GFR calc Af Amer: >90 mL/min (ref >90)
GFR calc non Af Amer: 88 mL/min — ABNORMAL LOW (ref >90)
Glucose, Bld: 176 mg/dL — ABNORMAL HIGH (ref 70–99)
Potassium: 3.8 mmol/L (ref 3.5–5.1)
Sodium: 139 mmol/L (ref 135–145)
Total Bilirubin: 0.3 mg/dL (ref 0.3–1.2)
Total Protein: 6.6 g/dL (ref 6.0–8.3)

## 2014-06-25 NOTE — Telephone Encounter (Signed)
No additional note

## 2014-06-25 NOTE — Progress Notes (Signed)
Echocardiogram 2D Echocardiogram has been performed.  Kristin Pennington 06/25/2014, 12:02 PM

## 2014-06-27 ENCOUNTER — Ambulatory Visit
Admission: RE | Admit: 2014-06-27 | Discharge: 2014-06-27 | Disposition: A | Discharge status: Home / Self Care | Payer: Medicare Other | Source: Ambulatory Visit | Attending: General Surgery | Admitting: General Surgery

## 2014-06-27 ENCOUNTER — Other Ambulatory Visit: Payer: Self-pay | Admitting: General Surgery

## 2014-06-27 DIAGNOSIS — R928 Other abnormal and inconclusive findings on diagnostic imaging of breast: Secondary | ICD-10-CM

## 2014-06-27 DIAGNOSIS — C50412 Malignant neoplasm of upper-outer quadrant of left female breast: Secondary | ICD-10-CM | POA: Diagnosis not present

## 2014-06-28 NOTE — H&P (Signed)
Kristin Pennington  Location: [facility] Patient #: [id number] DOB: 09-19-1945 Undefined / Language: Undefined / Race: Undefined Female      History of Present Illness  The patient is a 69 year old female who presents with breast cancer. She is referred by Dr. Augustin Coupe at the breast center of [facility] for evaluation and management of a newly diagnosed invasive cancer of the right breast, upper inner quadrant. Dr. Jamey Reas is her gynecologist and PCP in [facility]. She is being evaluated in the [facility] today by Dr. Burr Medico, Dr. Pablo Ledger, and me. She recently felt a painless mass in the high upper inner quadrant of the right breast. Mammogram and ultrasound described a 2.9 cm mass in the right breast upper inner quadrant, 1:00 position, 15 cm from the nipple, posterior third. Image guided biopsy shows a very poorly differentiated cancer, presumably breast primary, grade 3, triple negative breast cancer. Our multidisciplinary discussion resulted in scheduling of an MRI with the thought that she will need a Port-A-Cath insertion and neoadjuvant chemotherapy. A PET scan will be arranged to make sure there is not any systemic cancer. She has been advised to discontinue her estradiol. Hopefully her tumor will downstaging she will become a candidate for partial mastectomy and sentinel node biopsy in the future. Family history is negative for breast or ovarian cancer. Sister had lung cancer. Comorbidities include seizure disorder, no recent seizures. Appendectomy. Vaginal hysterectomy and BSO. Open cholecystectomy. No prior breast disease. She is married. Her daughter-in-law is with her today. Denies tobacco. Lives in [place]. I discussed the indications, details, techniques, and numerous risk of Port-A-Cath insertion with her. She's aware the risk of bleeding, infection, venous thrombosis, pneumothorax, malfunction requiring revision, air embolus, and other unforeseen  problems. She understands these issues well. All of her questions are answered. She agrees with this plan. I told her that my office will call her tomorrow to set this up.   Other Problems  Arthritis Bladder Problems Breast Cancer Cholelithiasis Lump In Breast Oophorectomy Bilateral. Seizure Disorder  Past Surgical History Appendectomy Breast Biopsy Right. Gallbladder Surgery - Open Hysterectomy (not due to cancer) - Complete Oral Surgery Tonsillectomy  Diagnostic Studies History  Colonoscopy 5-10 years ago Mammogram within last year Pap Smear >5 years ago  Social History  No alcohol use No caffeine use No drug use Tobacco use Former smoker.  Family History  Alcohol Abuse Father, Sister. Arthritis Family Members In General, Mother. Cancer Sister. Cerebrovascular Accident Family Members In General. Cervical Cancer Family Members In General. Diabetes Mellitus Mother, Sister. Heart Disease Father, Mother. Heart disease in female family member before age 70 Melanoma Sister. Prostate Cancer Family Members In General. Respiratory Condition Family Members In General, Father. Seizure disorder Family Members In General.  Pregnancy / Birth History  Age at menarche 54 years. Age of menopause 4-50 Gravida 2 Irregular periods Maternal age 14-20 Para 1  Review of Systems  General Present- Night Sweats. Not Present- Appetite Loss, Chills, Fatigue, Fever, Weight Gain and Weight Loss. Skin Present- Dryness. Not Present- Change in Wart/Mole, Hives, Jaundice, New Lesions, Non-Healing Wounds, Rash and Ulcer. HEENT Present- Hoarseness and Wears glasses/contact lenses. Not Present- Earache, Hearing Loss, Nose Bleed, Oral Ulcers, Ringing in the Ears, Seasonal Allergies, Sinus Pain, Sore Throat, Visual Disturbances and Yellow Eyes. Respiratory Present- Snoring. Not Present- Bloody sputum, Chronic Cough, Difficulty Breathing and  Wheezing. Breast Present- Breast Mass. Not Present- Breast Pain, Nipple Discharge and Skin Changes. Cardiovascular Present- Leg Cramps. Not Present- Chest Pain,  Difficulty Breathing Lying Down, Palpitations, Rapid Heart Rate, Shortness of Breath and Swelling of Extremities. Gastrointestinal Present- Excessive gas and Indigestion. Not Present- Abdominal Pain, Bloating, Bloody Stool, Change in Bowel Habits, Chronic diarrhea, Constipation, Difficulty Swallowing, Gets full quickly at meals, Hemorrhoids, Nausea, Rectal Pain and Vomiting. Female Genitourinary Not Present- Frequency, Nocturia, Painful Urination, Pelvic Pain and Urgency. Musculoskeletal Present- Joint Pain, Joint Stiffness and Swelling of Extremities. Not Present- Back Pain, Muscle Pain and Muscle Weakness. Neurological Not Present- Decreased Memory, Fainting, Headaches, Numbness, Seizures, Tingling, Tremor, Trouble walking and Weakness. Psychiatric Present- Anxiety. Not Present- Bipolar, Change in Sleep Pattern, Depression, Fearful and Frequent crying. Endocrine Present- Cold Intolerance. Not Present- Excessive Hunger, Hair Changes, Heat Intolerance, Hot flashes and New Diabetes. Hematology Present- Easy Bruising. Not Present- Excessive bleeding, Gland problems, HIV and Persistent Infections.   Physical Exam  General Mental Status-Alert. General Appearance-Consistent with stated age. Hydration-Well hydrated. Voice-Normal.  Head and Neck Head-normocephalic, atraumatic with no lesions or palpable masses. Trachea-midline. Thyroid Gland Characteristics - normal size and consistency.  Eye Eyeball - Bilateral-Extraocular movements intact. Sclera/Conjunctiva - Bilateral-No scleral icterus.  Chest and Lung Exam Chest and lung exam reveals -quiet, even and easy respiratory effort with no use of accessory muscles and on auscultation, normal breath sounds, no adventitious sounds and normal vocal  resonance. Inspection Chest Wall - Normal. Back - normal.  Breast Breast - Left-Symmetric, Non Tender, No Biopsy scars, no Dimpling, No Inflammation, No Lumpectomy scars, No Mastectomy scars, No Peau d' Orange. Breast - Right-Symmetric and Biopsy scar, Non Tender, No Dimpling, No Inflammation, No Lumpectomy scars, No Mastectomy scars, No Peau d' Orange. Note: There is a mobile, palpable mass high in the right breast, at 1:00 position. Slight skin discoloration but no invasion. This feels to be 3.5 cm or greater. No other mass in either breast. No axillary adenopathy.   Cardiovascular Cardiovascular examination reveals -normal heart sounds, regular rate and rhythm with no murmurs and normal pedal pulses bilaterally.  Abdomen Inspection Inspection of the abdomen reveals - No Hernias. Palpation/Percussion Palpation and Percussion of the abdomen reveal - Soft, Non Tender, No Rebound tenderness, No Rigidity (guarding) and No hepatosplenomegaly. Auscultation Auscultation of the abdomen reveals - Bowel sounds normal. Note: right subcostal scar well healed   Neurologic Neurologic evaluation reveals -alert and oriented x 3 with no impairment of recent or remote memory. Mental Status-Normal.  Musculoskeletal Normal Exam - Left-Upper Extremity Strength Normal and Lower Extremity Strength Normal. Normal Exam - Right-Upper Extremity Strength Normal and Lower Extremity Strength Normal.  Lymphatic Head & Neck  General Head & Neck Lymphatics: Bilateral - Description - Normal. Axillary  General Axillary Region: Bilateral - Description - Normal. Tenderness - Non Tender. Femoral & Inguinal  Generalized Femoral & Inguinal Lymphatics: Bilateral - Description - Normal. Tenderness - Non Tender.    Assessment & Plan PRIMARY CANCER OF UPPER INNER QUADRANT OF RIGHT FEMALE BREAST (174.2  C50.211) Current Plans    Schedule for Surgery  You have been diagnosed with a 3 cm or  greater invasive cancer in the right breast, upper inner quadrant. This is poorly differentiated. This is a triple negative breast cancer. You will be scheduled for bilateral breast MRI. We plan to give you chemotherapy in hopes of shrinking the tumor prior to any surgery. If the tumor responds to the chemotherapy, then you will become a candidate for lumpectomy and sentinel node biopsy hopefully in a few months. Dr. Darrel Hoover office will call you tomorrow to schedule you for Port-A-Cath  insertion. We have discussed the indications, techniques, and numerous risks of Port-A-Cath insertion in detail. Discontinue the estradiol patch.  HISTORY OF SEIZURE DISORDER (V12.49  Z86.69) HISTORY OF TOTAL ABDOMINAL HYSTERECTOMY AND BILATERAL SALPINGO-OOPHORECTOMY (V88.01  Z90.710) Impression: Total hysterectomy and BSO was done vaginally, according to patient. HISTORY OF APPENDECTOMY (V45.89  Z90.49) HISTORY OF CHOLECYSTECTOMY (V45.79  Z90.49) Impression: open procedure right subcostal incision   Edsel Petrin. Dalbert Batman, M.D., [facility] General and Minimally invasive Surgery Breast and Colorectal Surgery Office:   [phone] Pager:   [phone]

## 2014-06-30 ENCOUNTER — Encounter (HOSPITAL_BASED_OUTPATIENT_CLINIC_OR_DEPARTMENT_OTHER): Payer: Self-pay | Admitting: *Deleted

## 2014-06-30 ENCOUNTER — Ambulatory Visit (HOSPITAL_BASED_OUTPATIENT_CLINIC_OR_DEPARTMENT_OTHER): Payer: Medicare Other | Admitting: Anesthesiology

## 2014-06-30 ENCOUNTER — Encounter (HOSPITAL_BASED_OUTPATIENT_CLINIC_OR_DEPARTMENT_OTHER)
Admission: RE | Disposition: A | Discharge status: Home / Self Care | Payer: Self-pay | Source: Ambulatory Visit | Attending: General Surgery

## 2014-06-30 ENCOUNTER — Ambulatory Visit (HOSPITAL_BASED_OUTPATIENT_CLINIC_OR_DEPARTMENT_OTHER)
Admission: RE | Admit: 2014-06-30 | Discharge: 2014-06-30 | Disposition: A | Discharge status: Home / Self Care | Payer: Medicare Other | Source: Ambulatory Visit | Attending: General Surgery | Admitting: General Surgery

## 2014-06-30 ENCOUNTER — Ambulatory Visit (HOSPITAL_COMMUNITY): Payer: Medicare Other

## 2014-06-30 DIAGNOSIS — C50211 Malignant neoplasm of upper-inner quadrant of right female breast: Secondary | ICD-10-CM | POA: Diagnosis not present

## 2014-06-30 DIAGNOSIS — G40909 Epilepsy, unspecified, not intractable, without status epilepticus: Secondary | ICD-10-CM | POA: Insufficient documentation

## 2014-06-30 DIAGNOSIS — Z01818 Encounter for other preprocedural examination: Secondary | ICD-10-CM

## 2014-06-30 DIAGNOSIS — Z452 Encounter for adjustment and management of vascular access device: Secondary | ICD-10-CM | POA: Diagnosis not present

## 2014-06-30 DIAGNOSIS — M1990 Unspecified osteoarthritis, unspecified site: Secondary | ICD-10-CM | POA: Diagnosis not present

## 2014-06-30 DIAGNOSIS — Z87891 Personal history of nicotine dependence: Secondary | ICD-10-CM | POA: Diagnosis not present

## 2014-06-30 DIAGNOSIS — Z95828 Presence of other vascular implants and grafts: Secondary | ICD-10-CM

## 2014-06-30 DIAGNOSIS — C50911 Malignant neoplasm of unspecified site of right female breast: Secondary | ICD-10-CM | POA: Diagnosis not present

## 2014-06-30 HISTORY — DX: Presence of dental prosthetic device (complete) (partial): Z97.2

## 2014-06-30 HISTORY — DX: Presence of spectacles and contact lenses: Z97.3

## 2014-06-30 HISTORY — PX: PORTACATH PLACEMENT: SHX2246

## 2014-06-30 SURGERY — INSERTION, TUNNELED CENTRAL VENOUS DEVICE, WITH PORT
Anesthesia: General | Site: Chest

## 2014-06-30 MED ORDER — HYDROCODONE-ACETAMINOPHEN 5-325 MG PO TABS: 1.0000 | ORAL_TABLET | Freq: Four times a day (QID) | ORAL | Status: DC | PRN

## 2014-06-30 MED ORDER — LACTATED RINGERS IV SOLN
INTRAVENOUS | Status: DC
  Administered 2014-06-30: 10:00:00 via INTRAVENOUS

## 2014-06-30 MED ORDER — MIDAZOLAM HCL 2 MG/2ML IJ SOLN
INTRAMUSCULAR | Status: AC
  Filled 2014-06-30: qty 2

## 2014-06-30 MED ORDER — FENTANYL CITRATE 0.05 MG/ML IJ SOLN: 25.0000 ug | INTRAMUSCULAR | Status: DC | PRN

## 2014-06-30 MED ORDER — HEPARIN SOD (PORK) LOCK FLUSH 100 UNIT/ML IV SOLN
INTRAVENOUS | Status: DC | PRN
  Administered 2014-06-30: 500 [IU] via INTRAVENOUS

## 2014-06-30 MED ORDER — BUPIVACAINE-EPINEPHRINE (PF) 0.5% -1:200000 IJ SOLN
INTRAMUSCULAR | Status: AC
  Filled 2014-06-30: qty 30

## 2014-06-30 MED ORDER — HEPARIN SOD (PORK) LOCK FLUSH 100 UNIT/ML IV SOLN
INTRAVENOUS | Status: AC
  Filled 2014-06-30: qty 5

## 2014-06-30 MED ORDER — PROPOFOL 10 MG/ML IV BOLUS
INTRAVENOUS | Status: DC | PRN
  Administered 2014-06-30: 120 mg via INTRAVENOUS

## 2014-06-30 MED ORDER — FENTANYL CITRATE 0.05 MG/ML IJ SOLN: 50.0000 ug | INTRAMUSCULAR | Status: DC | PRN

## 2014-06-30 MED ORDER — MIDAZOLAM HCL 2 MG/2ML IJ SOLN: 1.0000 mg | INTRAMUSCULAR | Status: DC | PRN

## 2014-06-30 MED ORDER — BUPIVACAINE-EPINEPHRINE (PF) 0.5% -1:200000 IJ SOLN
INTRAMUSCULAR | Status: DC | PRN
  Administered 2014-06-30: 10 mL via PERINEURAL

## 2014-06-30 MED ORDER — EPHEDRINE SULFATE 50 MG/ML IJ SOLN
INTRAMUSCULAR | Status: DC | PRN
  Administered 2014-06-30: 10 mg via INTRAVENOUS

## 2014-06-30 MED ORDER — HEPARIN (PORCINE) IN NACL 2-0.9 UNIT/ML-% IJ SOLN
INTRAMUSCULAR | Status: DC | PRN
  Administered 2014-06-30: 500 mL

## 2014-06-30 MED ORDER — OXYCODONE HCL 5 MG PO TABS
ORAL_TABLET | ORAL | Status: AC
Start: 2014-06-30 — End: 2014-06-30
  Filled 2014-06-30: qty 1

## 2014-06-30 MED ORDER — FENTANYL CITRATE 0.05 MG/ML IJ SOLN
INTRAMUSCULAR | Status: AC
  Filled 2014-06-30: qty 2

## 2014-06-30 MED ORDER — CEFAZOLIN SODIUM-DEXTROSE 2-3 GM-% IV SOLR
2.0000 g | INTRAVENOUS | Status: AC
  Administered 2014-06-30: 2 g via INTRAVENOUS

## 2014-06-30 MED ORDER — CHLORHEXIDINE GLUCONATE 4 % EX LIQD: 1.0000 "application " | Freq: Once | CUTANEOUS | Status: DC

## 2014-06-30 MED ORDER — HEPARIN (PORCINE) IN NACL 2-0.9 UNIT/ML-% IJ SOLN
INTRAMUSCULAR | Status: AC
  Filled 2014-06-30: qty 500

## 2014-06-30 MED ORDER — MIDAZOLAM HCL 5 MG/5ML IJ SOLN
INTRAMUSCULAR | Status: DC | PRN
  Administered 2014-06-30: 2 mg via INTRAVENOUS

## 2014-06-30 MED ORDER — FENTANYL CITRATE 0.05 MG/ML IJ SOLN
INTRAMUSCULAR | Status: DC | PRN
  Administered 2014-06-30: 100 ug via INTRAVENOUS

## 2014-06-30 MED ORDER — OXYCODONE HCL 5 MG PO TABS
5.0000 mg | ORAL_TABLET | Freq: Once | ORAL | Status: AC
  Administered 2014-06-30: 5 mg via ORAL

## 2014-06-30 MED ORDER — ONDANSETRON HCL 4 MG/2ML IJ SOLN
INTRAMUSCULAR | Status: DC | PRN
  Administered 2014-06-30: 4 mg via INTRAVENOUS

## 2014-06-30 MED ORDER — DEXAMETHASONE SODIUM PHOSPHATE 4 MG/ML IJ SOLN
INTRAMUSCULAR | Status: DC | PRN
  Administered 2014-06-30: 10 mg via INTRAVENOUS

## 2014-06-30 MED ORDER — LIDOCAINE HCL (CARDIAC) 20 MG/ML IV SOLN
INTRAVENOUS | Status: DC | PRN
  Administered 2014-06-30: 40 mg via INTRAVENOUS

## 2014-06-30 MED ORDER — CEFAZOLIN SODIUM-DEXTROSE 2-3 GM-% IV SOLR
INTRAVENOUS | Status: AC
  Filled 2014-06-30: qty 50

## 2014-06-30 SURGICAL SUPPLY — 53 items
BAG DECANTER FOR FLEXI CONT (MISCELLANEOUS) ×2 IMPLANT
BENZOIN TINCTURE PRP APPL 2/3 (GAUZE/BANDAGES/DRESSINGS) IMPLANT
BLADE HEX COATED 2.75 (ELECTRODE) ×2 IMPLANT
BLADE SURG 15 STRL LF DISP TIS (BLADE) ×1 IMPLANT
BLADE SURG 15 STRL SS (BLADE) ×1
CANISTER SUCT 1200ML W/VALVE (MISCELLANEOUS) IMPLANT
CHLORAPREP W/TINT 26ML (MISCELLANEOUS) ×2 IMPLANT
COVER BACK TABLE 60X90IN (DRAPES) ×2 IMPLANT
COVER MAYO STAND STRL (DRAPES) ×2 IMPLANT
COVER PROBE 5X48 (MISCELLANEOUS) ×1
DECANTER SPIKE VIAL GLASS SM (MISCELLANEOUS) IMPLANT
DERMABOND ADVANCED (GAUZE/BANDAGES/DRESSINGS) ×1
DERMABOND ADVANCED .7 DNX12 (GAUZE/BANDAGES/DRESSINGS) ×1 IMPLANT
DRAPE C-ARM 42X72 X-RAY (DRAPES) ×2 IMPLANT
DRAPE LAPAROSCOPIC ABDOMINAL (DRAPES) ×2 IMPLANT
DRAPE UTILITY XL STRL (DRAPES) ×2 IMPLANT
DRSG TEGADERM 2-3/8X2-3/4 SM (GAUZE/BANDAGES/DRESSINGS) IMPLANT
DRSG TEGADERM 4X10 (GAUZE/BANDAGES/DRESSINGS) IMPLANT
DRSG TEGADERM 4X4.75 (GAUZE/BANDAGES/DRESSINGS) IMPLANT
ELECT REM PT RETURN 9FT ADLT (ELECTROSURGICAL) ×2
ELECTRODE REM PT RTRN 9FT ADLT (ELECTROSURGICAL) ×1 IMPLANT
GLOVE BIOGEL M 7.0 STRL (GLOVE) ×2 IMPLANT
GLOVE BIOGEL PI IND STRL 7.5 (GLOVE) ×1 IMPLANT
GLOVE BIOGEL PI INDICATOR 7.5 (GLOVE) ×1
GLOVE EUDERMIC 7 POWDERFREE (GLOVE) ×2 IMPLANT
GLOVE EXAM NITRILE EXT CUFF MD (GLOVE) ×2 IMPLANT
GOWN STRL REUS W/ TWL LRG LVL3 (GOWN DISPOSABLE) ×1 IMPLANT
GOWN STRL REUS W/ TWL XL LVL3 (GOWN DISPOSABLE) ×1 IMPLANT
GOWN STRL REUS W/TWL LRG LVL3 (GOWN DISPOSABLE) ×1
GOWN STRL REUS W/TWL XL LVL3 (GOWN DISPOSABLE) ×1
IV CATH PLACEMENT UNIT 16 GA (IV SOLUTION) IMPLANT
IV KIT MINILOC 20X1 SAFETY (NEEDLE) IMPLANT
KIT CVR 48X5XPRB PLUP LF (MISCELLANEOUS) ×1 IMPLANT
KIT PORT POWER 8FR ISP CVUE (Catheter) ×2 IMPLANT
NEEDLE BLUNT 17GA (NEEDLE) IMPLANT
NEEDLE HYPO 22GX1.5 SAFETY (NEEDLE) ×2 IMPLANT
NEEDLE HYPO 25X1 1.5 SAFETY (NEEDLE) ×2 IMPLANT
PACK BASIN DAY SURGERY FS (CUSTOM PROCEDURE TRAY) ×2 IMPLANT
PENCIL BUTTON HOLSTER BLD 10FT (ELECTRODE) ×2 IMPLANT
SET SHEATH INTRODUCER 10FR (MISCELLANEOUS) IMPLANT
SHEATH COOK PEEL AWAY SET 9F (SHEATH) IMPLANT
SLEEVE SCD COMPRESS KNEE MED (MISCELLANEOUS) ×2 IMPLANT
SPONGE GAUZE 4X4 12PLY STER LF (GAUZE/BANDAGES/DRESSINGS) IMPLANT
STRIP CLOSURE SKIN 1/2X4 (GAUZE/BANDAGES/DRESSINGS) IMPLANT
SUT MNCRL AB 4-0 PS2 18 (SUTURE) ×2 IMPLANT
SUT PROLENE 2 0 CT2 30 (SUTURE) ×2 IMPLANT
SUT VICRYL 3-0 CR8 SH (SUTURE) ×2 IMPLANT
SYR 5ML LUER SLIP (SYRINGE) ×2 IMPLANT
SYRINGE 10CC LL (SYRINGE) ×2 IMPLANT
TOWEL OR 17X24 6PK STRL BLUE (TOWEL DISPOSABLE) ×4 IMPLANT
TOWEL OR NON WOVEN STRL DISP B (DISPOSABLE) ×2 IMPLANT
TUBE CONNECTING 20X1/4 (TUBING) IMPLANT
YANKAUER SUCT BULB TIP NO VENT (SUCTIONS) IMPLANT

## 2014-06-30 NOTE — Anesthesia Preprocedure Evaluation (Addendum)
Anesthesia Evaluation  Patient identified by MRN, date of birth, ID band Patient awake    Reviewed: Allergy & Precautions, H&P , NPO status , Patient's Chart, lab work & pertinent test results  Airway Mallampati: II  TM Distance: >3 FB Neck ROM: Full    Dental no notable dental hx. (+) Teeth Intact, Dental Advisory Given   Pulmonary neg pulmonary ROS, former smoker,  breath sounds clear to auscultation  Pulmonary exam normal       Cardiovascular negative cardio ROS  Rhythm:Regular Rate:Normal     Neuro/Psych Seizures -, Well Controlled,  negative psych ROS   GI/Hepatic negative GI ROS, Neg liver ROS,   Endo/Other  negative endocrine ROS  Renal/GU negative Renal ROS  negative genitourinary   Musculoskeletal  (+) Arthritis -, Osteoarthritis,    Abdominal   Peds  Hematology negative hematology ROS (+)   Anesthesia Other Findings   Reproductive/Obstetrics negative OB ROS                           Anesthesia Physical Anesthesia Plan  ASA: II  Anesthesia Plan: General   Post-op Pain Management:    Induction: Intravenous  Airway Management Planned: LMA  Additional Equipment:   Intra-op Plan:   Post-operative Plan: Extubation in OR  Informed Consent: I have reviewed the patients History and Physical, chart, labs and discussed the procedure including the risks, benefits and alternatives for the proposed anesthesia with the patient or authorized representative who has indicated his/her understanding and acceptance.   Dental advisory given  Plan Discussed with: CRNA and Anesthesiologist  Anesthesia Plan Comments:        Anesthesia Quick Evaluation

## 2014-06-30 NOTE — Transfer of Care (Signed)
Immediate Anesthesia Transfer of Care Note  Patient: Kristin Pennington  Procedure(s) Performed: Procedure(s): INSERTION PORT-A-CATH WITH ULTRA SOUND ON STANDBY (N/A)  Patient Location: PACU  Anesthesia Type:General  Level of Consciousness: awake, alert  and oriented  Airway & Oxygen Therapy: Patient Spontanous Breathing and Patient connected to face mask oxygen  Post-op Assessment: Report given to RN and Post -op Vital signs reviewed and stable  Post vital signs: Reviewed and stable  Last Vitals:  Filed Vitals:   06/30/14 0921  BP: 127/69  Pulse: 64  Temp: 36.4 C  Resp: 16    Complications: No apparent anesthesia complications

## 2014-06-30 NOTE — Anesthesia Postprocedure Evaluation (Signed)
  Anesthesia Post-op Note  Patient: Kristin Pennington  Procedure(s) Performed: Procedure(s): INSERTION PORT-A-CATH WITH ULTRA SOUND ON STANDBY (N/A)  Patient Location: PACU  Anesthesia Type:General  Level of Consciousness: awake and alert   Airway and Oxygen Therapy: Patient Spontanous Breathing  Post-op Pain: none  Post-op Assessment: Post-op Vital signs reviewed, Patient's Cardiovascular Status Stable and Respiratory Function Stable  Post-op Vital Signs: Reviewed  Filed Vitals:   06/30/14 1245  BP: 104/59  Pulse: 65  Temp:   Resp: 11    Complications: No apparent anesthesia complications

## 2014-06-30 NOTE — Op Note (Signed)
Patient Name:           Kristin Pennington   Date of Surgery:        06/30/2014  Pre op Diagnosis:      Invasive carcinoma right breast, upper inner quadrant, triple negative breast cancer  Post op Diagnosis:    Same  Procedure:                 Insertion of 8 French power port clear view tunneled venous vascular access device, Use of fluoroscopy for guidance and positioning  Surgeon:                     Edsel Petrin. Dalbert Batman, M.D., FACS  Assistant:                      OR staff  Operative Indications:   The patient is a 69 year old female who presents with breast cancer. She is referred by Dr. Augustin Coupe at the breast center of [facility] for evaluation and management of a newly diagnosed invasive cancer of the right breast, upper inner quadrant. Dr. Jamey Reas is her gynecologist and PCP in [facility]. She is being evaluated in the [facility] today by Dr. Burr Medico, Dr. Pablo Ledger, and me. She recently felt a painless mass in the high upper inner quadrant of the right breast. Mammogram and ultrasound described a 2.9 cm mass in the right breast upper inner quadrant, 1:00 position, 15 cm from the nipple, posterior third. Image guided biopsy shows a very poorly differentiated cancer, presumably breast primary, grade 3, triple negative breast cancer. Our multidisciplinary discussion resulted in scheduling of an MRI with the thought that she will need a Port-A-Cath insertion and neoadjuvant chemotherapy. The MRI was performed and suggest the possibility of a second mass, in the left breast, and another biopsy scheduled later this week. A PET scan will be arranged to make sure there is not any systemic cancer. That is also  scheduled for later this week. She has been advised to discontinue her estradiol. Hopefully her tumor will downstaging she will become a candidate for partial mastectomy and sentinel node biopsy in the future. Family history is negative for breast or ovarian cancer. Sister had lung  cancer. Comorbidities include seizure disorder, no recent seizures. Appendectomy. Vaginal hysterectomy and BSO. Open cholecystectomy. No prior breast disease. . Lives in [place].   Operative Findings:       I inserted the catheter through the left subclavian vein, and C-arm imaging in the OR revealed  that the catheter tip was in the superior vena cava and appeared to be well positioned. The catheter and the port flushed easily. It had excellent blood return.      Procedure in Detail:          Following the induction of general LMA anesthesia the patient was positioned with a small roll behind her shoulders and her arms tucked at her sides. The neck and chest was prepped and draped in a sterile fashion. Intravenous antibiotics were given. Surgical timeout was performed. 0.5% Marcaine with epinephrine was used as a local infiltration anesthetic.    A left subclavian venipuncture was performed with a single pass and a guidewire inserted into the superior vena cava under fluoroscopic guidance. A small incision was made at the wire insertion site. Using the C-arm I marked a template on the chest wall to guide positioning of the catheter so that the tip would be in the superior vena  cava just above the right atrium. A transverse incision was made below the left clavicle and a subcutaneous pocket was created. Using a tunneling device I passed the catheter from the port pocket site to the wire insertion site. Then, using the template drawn on the chest wall, I measured the catheter and cut it 24 cm in length. The catheter was secured to the port with the locking device and the catheter and port were flushed with heparinized saline. The dilator and peel-away sheath were inserted over the guidewire into the central venous circulation, the dilator and guidewire removed, the catheter threaded through the peel-away sheath and the peel-away sheath removed. The catheter flushed easily and had excellent  blood return. Fluoroscopy confirmed that the catheter tip was well-positioned in the superior vena cava just above the right atrium and there is no deformity of the catheter anywhere along its course. The port and catheter were flushed with concentrated heparin. The port was sutured to the pectoralis fascia with 3 interrupted sutures of 2-0 Prolene. The subcutaneous tissue was closed with 3-0 Vicryl sutures and the skin incisions closed with subcuticular 4-0 Monocryl sutures and Dermabond. The patient tolerated the procedure well was taken to PACU in stable condition, where a chest x-ray but will be obtained. EBL 10 mL. Counts correct. Complications none.     Edsel Petrin. Dalbert Batman, M.D., FACS General and Minimally Invasive Surgery Breast and Colorectal Surgery  06/30/2014 12:11 PM

## 2014-06-30 NOTE — Anesthesia Procedure Notes (Signed)
Procedure Name: LMA Insertion Date/Time: 06/30/2014 11:22 AM Performed by: Maryella Shivers Pre-anesthesia Checklist: Patient identified, Emergency Drugs available, Suction available and Patient being monitored Patient Re-evaluated:Patient Re-evaluated prior to inductionOxygen Delivery Method: Circle System Utilized Preoxygenation: Pre-oxygenation with 100% oxygen Intubation Type: IV induction Ventilation: Mask ventilation without difficulty LMA: LMA inserted LMA Size: 4.0 Number of attempts: 1 Airway Equipment and Method: Bite block Placement Confirmation: positive ETCO2 Tube secured with: Tape Dental Injury: Teeth and Oropharynx as per pre-operative assessment

## 2014-06-30 NOTE — Interval H&P Note (Signed)
History and Physical Interval Note:  06/30/2014 9:24 AM  Kristin Pennington  has presented today for surgery, with the diagnosis of right breast cancer  The various methods of treatment have been discussed with the patient and family. After consideration of risks, benefits and other options for treatment, the patient has consented to  Procedure(s): INSERTION PORT-A-CATH WITH ULTRA SOUND ON STANDBY (N/A) as a surgical intervention .  The patient's history has been reviewed, patient examined, no change in status, stable for surgery.  I have reviewed the patient's chart and labs.  Questions were answered to the patient's satisfaction.     [name]

## 2014-06-30 NOTE — Discharge Instructions (Signed)

## 2014-07-02 ENCOUNTER — Other Ambulatory Visit: Payer: Medicare Other

## 2014-07-02 ENCOUNTER — Encounter (HOSPITAL_BASED_OUTPATIENT_CLINIC_OR_DEPARTMENT_OTHER): Payer: Self-pay | Admitting: General Surgery

## 2014-07-02 ENCOUNTER — Encounter: Payer: Self-pay | Admitting: *Deleted

## 2014-07-02 ENCOUNTER — Ambulatory Visit (HOSPITAL_COMMUNITY)
Admission: RE | Admit: 2014-07-02 | Discharge: 2014-07-02 | Disposition: A | Discharge status: Home / Self Care | Payer: Medicare Other | Source: Ambulatory Visit | Attending: Hematology | Admitting: Hematology

## 2014-07-02 DIAGNOSIS — C50211 Malignant neoplasm of upper-inner quadrant of right female breast: Secondary | ICD-10-CM | POA: Insufficient documentation

## 2014-07-02 DIAGNOSIS — C50919 Malignant neoplasm of unspecified site of unspecified female breast: Secondary | ICD-10-CM | POA: Diagnosis not present

## 2014-07-02 DIAGNOSIS — C773 Secondary and unspecified malignant neoplasm of axilla and upper limb lymph nodes: Secondary | ICD-10-CM | POA: Diagnosis not present

## 2014-07-02 DIAGNOSIS — Z79899 Other long term (current) drug therapy: Secondary | ICD-10-CM | POA: Diagnosis not present

## 2014-07-02 DIAGNOSIS — R918 Other nonspecific abnormal finding of lung field: Secondary | ICD-10-CM | POA: Diagnosis not present

## 2014-07-02 LAB — GLUCOSE, CAPILLARY: Glucose-Capillary: 96 mg/dL (ref 70–99)

## 2014-07-02 MED ORDER — FLUDEOXYGLUCOSE F - 18 (FDG) INJECTION
8.1500 | Freq: Once | INTRAVENOUS | Status: AC | PRN
  Administered 2014-07-02: 8.15 via INTRAVENOUS

## 2014-07-02 NOTE — Addendum Note (Signed)
Addendum  created 07/02/14 3582 by Tawni Millers, CRNA   Modules edited: Charges VN

## 2014-07-04 ENCOUNTER — Encounter: Payer: Self-pay | Admitting: *Deleted

## 2014-07-04 ENCOUNTER — Ambulatory Visit
Admission: RE | Admit: 2014-07-04 | Discharge: 2014-07-04 | Disposition: A | Discharge status: Home / Self Care | Payer: Medicare Other | Source: Ambulatory Visit | Attending: General Surgery | Admitting: General Surgery

## 2014-07-04 DIAGNOSIS — R928 Other abnormal and inconclusive findings on diagnostic imaging of breast: Secondary | ICD-10-CM

## 2014-07-04 DIAGNOSIS — C50412 Malignant neoplasm of upper-outer quadrant of left female breast: Secondary | ICD-10-CM | POA: Diagnosis not present

## 2014-07-04 DIAGNOSIS — D0512 Intraductal carcinoma in situ of left breast: Secondary | ICD-10-CM | POA: Diagnosis not present

## 2014-07-04 MED ORDER — GADOBENATE DIMEGLUMINE 529 MG/ML IV SOLN: 15.0000 mL | Freq: Once | INTRAVENOUS | Status: AC | PRN

## 2014-07-04 NOTE — Progress Notes (Signed)
Left message with Sherry Ruffing, nurse manager at [facility] to see if patient would be able to receive neulasta there.  This patient states she is extremely sensitive to adhesives tape.  Plan had been to use the neulasta on pro.  After talking with our pharmacy and the [facility] rep may not be good for patient to use the on pro.  This was discussed with Dr. Burr Medico.  Will wait to hear back from [facility].

## 2014-07-04 NOTE — Progress Notes (Signed)
Received message back from [place] at [facility].  Patient could come there to get the neulasta as she may not be able to do the on pro.   Patient was called and notified that it was an option for her to get neulasta there instead of coming to [facility].  Patient will think about it and discuss With Dr. Burr Medico when she sees her on 4/11.   Dr. Burr Medico would need to contact Dr. Whitney Muse at [facility] to set up.

## 2014-07-07 ENCOUNTER — Encounter: Payer: Self-pay | Admitting: Hematology

## 2014-07-07 ENCOUNTER — Telehealth: Payer: Self-pay | Admitting: Hematology

## 2014-07-07 ENCOUNTER — Ambulatory Visit (HOSPITAL_BASED_OUTPATIENT_CLINIC_OR_DEPARTMENT_OTHER): Payer: Medicare Other | Admitting: Hematology

## 2014-07-07 VITALS — BP 129/62 | HR 63 | Temp 97.7°F | Resp 18 | Ht 64.0 in | Wt 161.8 lb

## 2014-07-07 DIAGNOSIS — Z171 Estrogen receptor negative status [ER-]: Secondary | ICD-10-CM

## 2014-07-07 DIAGNOSIS — C50211 Malignant neoplasm of upper-inner quadrant of right female breast: Secondary | ICD-10-CM

## 2014-07-07 DIAGNOSIS — C50911 Malignant neoplasm of unspecified site of right female breast: Secondary | ICD-10-CM | POA: Diagnosis not present

## 2014-07-07 NOTE — Telephone Encounter (Signed)
Gave and printed appt sched and avs fo rpt for April...sed Added tx.

## 2014-07-07 NOTE — Progress Notes (Signed)
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 Ultrasound of the right axilla is negative   06/05/2014 Pathology Results INVASIVE MAMMARY CARCINOMA   06/05/2014 Receptors her2 Estrogen Receptor: 0%, NEGATIVE Progesterone Receptor: 0%, NEGATIVE Proliferation Marker Ki67: 98%  HER-2/NEU BY CISH - NEGATIVE   06/11/2014 Initial Diagnosis Breast cancer of upper-inner quadrant of right female breast   07/02/2014 PET scan 1. Intensely hypermetabolic mass in the medial RIGHT chest wall with hypermetabolic right axillary node 2. Hypermetabolic nodal metastasis in the LEFT level 2 and LEFT supraclavicular and periportal node (SUV 5).    07/04/2014 Pathology Results Left breast mass biopsy showed     HISTORY OF PRESENTING ILLNESS:  Kristin Pennington 69 y.o. female is here because of recently diagnosed poorly differentiated carcinoma in her right breast.  She found a lump in her right breast 3 weeks ago, mild tenderness,  No other complains. Last mammo was in oct 2015 which was negative. She feels well overall. She denies any pain, cough, shortness breast, GI symptoms. She has good energy level and appetite, no  recent weight loss.  Her last colonoscopy was for 5 years ago which was negative per patient. Her last Pap smear was 3 years ago which was negative per patient.  INTERIM HISTORY: And Mrs. Twist returns for follow-up. She had a port placed last week, and tolerated well. She is otherwise doing well, denies any new complaints.  MEDICAL HISTORY:  Past Medical History  Diagnosis Date  . Breast cancer   . Arthritis   . Wears glasses   . Wears partial dentures     top  . Seizures     last 2006     SURGICAL HISTORY: Past Surgical History  Procedure Laterality Date  . Appendectomy    . Cholecystectomy    . Abdominal hysterectomy    . Tonsillectomy    . Colonoscopy    . Portacath placement N/A 06/30/2014    Procedure: INSERTION PORT-A-CATH WITH ULTRA SOUND ON STANDBY;  Surgeon: Fanny Skates, MD;  Location: [facility];  Service: General;  Laterality: N/A;    SOCIAL HISTORY: History   Social History  . Marital Status: Married    Spouse Name: N/A  . Number of Children: N/A  . Years of Education: N/A   Occupational History  . 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 Smoking status: Not on file  . Smokeless tobacco: Not on file  . Alcohol Use: Not on file  . Drug Use: Not on file  . Sexual Activity: Not on file   Other Topics Concern  . Not on file   Social History Narrative  . No narrative on file   GYN HISTORY  Menarchal:  11 LMP: 1994, hysrectomy  Contraceptive: no  HRT: since 1994  G2P1, one miscarriage     FAMILY HISTORY: Family History  Problem Relation Age of Onset  . Cancer Father 42    gastric cancer   . Cancer Sister 19    lung cancer     ALLERGIES:  is allergic to aspirin; codeine; diclofenac; meloxicam; nitrofurantoin; other; pyridium; sulfa antibiotics; tetracyclines & related; and tape.  MEDICATIONS:  Current Outpatient Prescriptions  Medication Sig Dispense Refill  . aspirin EC 81 MG tablet  Take by mouth.    . calcium carbonate (OS-CAL) 600 MG TABS tablet Take 600 mg by mouth 2 (two) times daily with a meal.    . cyanocobalamin (,VITAMIN B-12,) 1000 MCG/ML injection Inject into the muscle.    Marland Kitchen HYDROcodone-acetaminophen (NORCO) 5-325 MG per tablet Take 1-2 tablets by mouth every 6 (six) hours as needed for moderate pain or severe pain. 30 tablet 0  . levETIRAcetam (KEPPRA) 500 MG tablet Take 1 tablet by mouth 2 (two) times daily.    Marland Kitchen oxaprozin (DAYPRO) 600 MG tablet Take 1 tablet by mouth 2 (two) times daily.    . phenytoin (DILANTIN) 100 MG ER capsule Take 1 tablet by mouth 4 (four) times daily. For 5 days a week    . phenytoin (DILANTIN) 100 MG ER capsule Take 100 mg by mouth 3 (three) times daily. For  2 days a week.     No current facility-administered medications for this visit.    REVIEW OF SYSTEMS:   Constitutional: Denies fevers, chills or abnormal night sweats Eyes: Denies blurriness of vision, double vision or watery eyes Ears, nose, mouth, throat, and face: Denies mucositis or sore throat Respiratory: Denies cough, dyspnea or wheezes Cardiovascular: Denies palpitation, chest discomfort or lower extremity swelling Gastrointestinal:  Denies nausea, heartburn or change in bowel habits Skin: Denies abnormal skin rashes Lymphatics: Denies new lymphadenopathy or easy bruising Neurological:Denies numbness, tingling or new weaknesses Behavioral/Psych: Mood is stable, no new changes  All other systems were reviewed with the patient and are negative.  PHYSICAL EXAMINATION: ECOG PERFORMANCE STATUS: 0 - Asymptomatic  Filed Vitals:   07/07/14 0848  BP: 129/62  Pulse: 63  Temp: 97.7 F (36.5 C)  Resp: 18   Filed Weights   07/07/14 0848  Weight: 161 lb 12.8 oz (73.392 kg)    GENERAL:alert, no distress and comfortable SKIN: skin color, texture, turgor are normal, no rashes or significant lesions EYES: normal, conjunctiva are pink and non-injected, sclera  clear OROPHARYNX:no exudate, no erythema and lips, buccal mucosa, and tongue normal  NECK: supple, thyroid normal size, non-tender, without nodularity LYMPH:  no palpable lymphadenopathy in the cervical, axillary or inguinal LUNGS: clear to auscultation and percussion with normal breathing effort HEART: regular rate & rhythm and no murmurs and no lower extremity edema ABDOMEN:abdomen soft, non-tender and normal bowel sounds Musculoskeletal:no cyanosis of digits and no clubbing  PSYCH: alert & oriented x 3 with fluent speech NEURO: no focal motor/sensory deficits Breasts: Breast inspection showed them to be symmetrical with no nipple discharge. There is a 4X6cm cm mass in the 10 clock positionof her right breast, slightly tender. Palpation of the left breasts and axilla revealed no obvious mass that I could appreciate.   LABORATORY DATA:  I have reviewed the data as listed Lab Results  Component Value Date   WBC 4.8 06/25/2014   HGB 12.4 06/25/2014   HCT 36.5 06/25/2014   MCV 95.1 06/25/2014   PLT 196  06/25/2014    Recent Labs  06/18/14 0814 06/25/14 1355  NA 141 139  K 4.5 3.8  CL  --  103  CO2 28 26  GLUCOSE 74 176*  BUN 14.7 16  CREATININE 0.7 0.67  CALCIUM 8.9 9.0  GFRNONAA  --  88*  GFRAA  --  >90  PROT 6.9 6.6  ALBUMIN 3.4* 3.6  AST 17 22  ALT 19 17  ALKPHOS 105 98  BILITOT 0.21 0.3     PATHOLOGY REPORT 06/05/2014 Diagnosis Breast, right, needle core biopsy - INVASIVE MAMMARY CARCINOMA, SEE COMMENT. Microscopic Comment Although the grade of tumor is best assessed at resection, with these biopsies, the invasive tumor is grade III. The tumor expresses cytokeratin AE1/3 immunostain and is negative for S-100 and melanin A immunostains. Breast prognostic studies  Results: Estrogen Receptor: 0%, NEGATIVE Progesterone Receptor: 0%, NEGATIVE Proliferation Marker Ki67: 98% COMMENT: The negative hormone receptor study(ies) in this case have an internal positive  control.  HER-2/NEU BY CISH - NEGATIVE. RESULT RATIO OF HER2: CEP 17 SIGNALS 1.31 AVERAGE HER2 COPY NUMBER PER CELL 2.10  RADIOGRAPHIC STUDIES: I have personally reviewed the radiological images as listed and agreed with the findings in the report. Chest 2 View  06/25/2014   CLINICAL DATA:  Preoperative examination prior Port-A-Cath insertion; history of breast malignancy and beginning chemotherapy; history of tobacco use.  EXAM: CHEST  2 VIEW  COMPARISON:  None.  FINDINGS: The lungs are adequately inflated. There is minimal subsegmental atelectasis or scarring at the right lung base. The heart and pulmonary vascularity are normal. There is mild tortuosity of the descending thoracic aorta. There is no pleural effusion. There is multilevel degenerative disc disease of the thoracic spine. There are old deformities of the lateral aspects of the seventh and eighth ribs on the right.  IMPRESSION: Minimal atelectasis versus scarring at the right lung base. The examination is otherwise unremarkable.   Electronically Signed   By: David  Martinique   On: 06/25/2014 14:49   Mr Breast Bilateral W Wo Contrast  06/24/2014   CLINICAL DATA:  Recently diagnosed right breast invasive mammary carcinoma.  LABS:  None obtained today.  EXAM: BILATERAL BREAST MRI WITH AND WITHOUT CONTRAST  TECHNIQUE: Multiplanar, multisequence MR images of both breasts were obtained prior to and following the intravenous administration of 14 ml of MultiHance.  THREE-DIMENSIONAL MR IMAGE RENDERING ON INDEPENDENT WORKSTATION:  Three-dimensional MR images were rendered by post-processing of the original MR data on an independent workstation. The three-dimensional MR images were interpreted, and findings are reported in the following complete MRI report for this study. Three dimensional images were evaluated at the independent DynaCad workstation  COMPARISON:  Previous mammogram, ultrasound and biopsy examinations.  FINDINGS: Breast composition: b.  Scattered fibroglandular tissue.  Background parenchymal enhancement: Mild  Right breast: 3.6 x 3.3 x 2.8 cm rounded, lobulated, enhancing mass in the posterior aspect of the upper inner quadrant. This corresponds to the recently biopsied invasive mammary carcinoma and has central nonenhancement compatible with necrosis. The mass has a mixture of enhancement kinetics, including rapid wash-in/washout. There are post biopsy changes with enhancement extending medially from this area to the skin. No findings elsewhere in the right breast suspicious for malignancy.  Left breast: 3.8 x 2.0 x 1.1 cm oval enhancing mass-like area in the midportion of the breast in the upper outer quadrant in approximately the 1 o'clock position. This has plateau enhancement kinetics. No findings elsewhere in the left breast suspicious for malignancy.  Lymph nodes: No abnormal appearing lymph nodes.  Ancillary findings:  None.  IMPRESSION: 1. 3.6 cm biopsy-proven invasive mammary carcinoma in the posterior aspect of the upper inner quadrant of the right breast. 2. 3.8 cm oval enhancing mass-like area in the upper outer quadrant of the left breast. 3. No adenopathy.  RECOMMENDATION: Left breast targeted ultrasound and ultrasound-guided core needle biopsy of the 3.8 cm oval mass-like area. If this cannot be seen by ultrasound, MR guided core needle biopsy of this would be recommended. We will arrange the ultrasound and biopsy.  BI-RADS CATEGORY  4: Suspicious.   Electronically Signed   By: Claudie Revering M.D.   On: 06/24/2014 16:48   Nm Pet Image Initial (pi) Skull Base To Thigh  07/02/2014   CLINICAL DATA:  Initial treatment strategy for breast carcinoma.  EXAM: NUCLEAR MEDICINE PET SKULL BASE TO THIGH  TECHNIQUE: 8.2 mCi F-18 FDG was injected intravenously. Full-ring PET imaging was performed from the skull base to thigh after the radiotracer. CT data was obtained and used for attenuation correction and anatomic localization.  FASTING BLOOD  GLUCOSE:  Value: 96 mg/dl  COMPARISON:  None.  FINDINGS: NECK  Hypermetabolic LEFT level 2 lymph node measures 8 mm short axis with SUV max 7.0 (image 26 PET-CT data set). Hypermetabolic LEFT supraclavicular node measures 9 mm (image 2) with SUV 9 3.  CHEST  Hypermetabolic mass in the medial RIGHT breast measured 3.9 cm with SUV max equal 22.5.  Hypermetabolic right axillary lymph node image.  There is moderately hypermetabolic pulmonary hilar activity.  Review of the lung parenchyma demonstrates no pulmonary nodules.  ABDOMEN/PELVIS  Hypermetabolic periportal lymph node measuring 8 mm short axis with moderate metabolic activity (SUV 5)  No abnormal metabolic liver activity. No retroperitoneal or pelvic lymph nodes.  SKELETON  No focal hypermetabolic activity to suggest skeletal metastasis.  IMPRESSION: 1. Intensely hypermetabolic mass in the medial RIGHT chest wall. 2. Hypermetabolic nodal metastasis in the LEFT level 2 and LEFT supraclavicular nodal stations. 3. Local nodal metastasis to the RIGHT axilla. Concern for nodal metastasis to the bilateral pulmonary hila. 4. Probable nodal metastasis to a periportal lymph node.   Electronically Signed   By: Suzy Bouchard M.D.   On: 07/02/2014 16:34   Dg Chest Port 1 View  06/30/2014   CLINICAL DATA:  Port-A-Cath insertion  EXAM: PORTABLE CHEST - 1 VIEW  COMPARISON:  06/25/2014  FINDINGS: The left subclavian Port-A-Cath tip is in the distal SVC near the cavoatrial junction. No complicating features. The cardiac silhouette, mediastinal and hilar contours are stable. Low lung volumes with vascular crowding and atelectasis but no infiltrates, edema, effusions or pneumothorax.  IMPRESSION: Left subclavian Port-A-Cath in good position without complicating features.   Electronically Signed   By: Marijo Sanes M.D.   On: 06/30/2014 12:43   Mm Digital Diagnostic Unilat L  07/04/2014   CLINICAL DATA:  Post biopsy clip mammograms following MRI guided biopsy of the left  breast.  EXAM: DIAGNOSTIC LEFT MAMMOGRAM POST MRI BIOPSY  COMPARISON:  Previous exam(s).  FINDINGS: Mammographic images were obtained following MRI guided biopsy of an area of focal abnormal enhancement in the upper outer left breast. Dumbbell shaped biopsy clip lies the biopsy cavity in the expected location of the abnormal MRI enhancement.  IMPRESSION: Well-positioned dumbbell shaped biopsy clip in the left breast following MRI guided biopsy.  Final Assessment: Post Procedure Mammograms for Marker Placement   Electronically Signed   By: Dedra Skeens.D.  On: 07/04/2014 09:04   Dg Fluoro Guide Cv Line-no Report  06/30/2014   CLINICAL DATA:    FLOURO GUIDE CV LINE  Fluoroscopy was utilized by the requesting physician.  No radiographic  interpretation.    US Breast Ltd Uni Left Inc Axilla  06/27/2014   CLINICAL DATA:  Second-look ultrasound for abnormal MR enhancement within the upper outer left breast. Recently diagnosed right breast cancer.  EXAM: ULTRASOUND OF THE LEFT BREAST  COMPARISON:  Prior exams including MR dated 06/24/2014.  FINDINGS: On physical exam, no discrete palpable abnormalities are identified within the upper left breast.  Targeted ultrasound is performed, showing scattered areas of shadowing from normal breast tissue identified. I cannot identify with certainty an area that corresponds to the abnormal MR enhancement within the upper left breast.  IMPRESSION: Abnormal upper left breast MR enhancement is not identified sonographically with certainty. MR guided left breast biopsy is recommended.  RECOMMENDATION: MR guided left breast biopsy. This has been scheduled for 07/04/2014 and the patient informed.  I have discussed the findings and recommendations with the patient. Results were also provided in writing at the conclusion of the visit. If applicable, a reminder letter will be sent to the patient regarding the next appointment.  BI-RADS CATEGORY  1: Negative   Electronically Signed   By:  Margarette Canada M.D.   On: 06/27/2014 14:40   Mr Aundra Millet Breast Bx Johnella Moloney Dev 1st Lesion Image Bx Spec Mr Guide  07/04/2014   CLINICAL DATA:  Patient has a recently diagnosed malignancy in the right breast and an area of abnormal enhancement in the upper outer left breast. She presents for MRI guided biopsy of this area of abnormal left breast enhancement.  EXAM: MRI GUIDED CORE NEEDLE BIOPSY OF THE LEFT BREAST  TECHNIQUE: Multiplanar, multisequence MR imaging of the left breast was performed both before and after administration of intravenous contrast.  CONTRAST:  15 mL of MultiHance intravenous contrast  COMPARISON:  Previous exams.  FINDINGS: I met with the patient, and we discussed the procedure of MRI guided biopsy, including risks, benefits, and alternatives. Specifically, we discussed the risks of infection, bleeding, tissue injury, clip migration, and inadequate sampling. Informed, written consent was given. The usual time out protocol was performed immediately prior to the procedure.  Using sterile technique, 2% Lidocaine, MRI guidance, and a 9 gauge vacuum assisted device, biopsy was performed of the focal area of abnormal enhancement in the upper outer left breast using a lateral approach. At the conclusion of the procedure, a dumbbell shaped tissue marker clip was deployed into the biopsy cavity. Follow-up 2-view mammogram was performed and dictated separately.  IMPRESSION: MRI guided biopsy of the left breast. No apparent complications.   Electronically Signed   By: Lajean Manes M.D.   On: 07/04/2014 09:02    ASSESSMENT & PLAN:  69 year old postmenopausal woman, with past medical history of seizure and arthritis, presented with a palpable mass in the inner upper quadrant of her right breast. Biopsy reviewed per differentiated carcinoma, triple negative.  1. Poorly differentiated carcinoma in her right breast, triple negative, with possible distant lymph nodes metastasis,  left breast DCIS -I reviewed  her PET scan findings and the left breast biopsy results with patient and her family member in great detail.  -Unfortunately, the PET scan showed hypermetabolic lymph nodes in the left supraclavicular, left cervical level II, and a periportal area. This are suspicious for distant note metastasis. -I recommend a ultrasound or CT-guided lymph node  biopsy to ruled out distant metastasis. I discussed with interventional radiologist Dr. Laurence Ferrari today, and he recommends to biopsy the left supraclavicular lymph nodes. We'll scheduled through interventional radiology, hopefully get down within week. -I'll see her back after her lymph node biopsy. -I'll discuss her scan and the biopsy results with her surgeon Dr. Dalbert Batman, to get his opinion about breast surgery if she has great response to chemotherapy.  Plan: -Ultrasound or CT-guided left supraclavicular lymph node biopsy -Return to clinic in 1 week to finalize her treatment plan.  All questions were answered. The patient knows to call the clinic with any problems, questions or concerns. I spent 30 minutes counseling the patient face to face. The total time spent in the appointment was 40 minutes and more than 50% was on counseling.     Truitt Merle, MD 07/07/2014 9:25 AM

## 2014-07-08 ENCOUNTER — Other Ambulatory Visit: Payer: Self-pay | Admitting: Radiology

## 2014-07-09 ENCOUNTER — Other Ambulatory Visit: Payer: Self-pay | Admitting: Radiology

## 2014-07-10 ENCOUNTER — Ambulatory Visit (HOSPITAL_COMMUNITY)
Admission: RE | Admit: 2014-07-10 | Discharge: 2014-07-10 | Disposition: A | Discharge status: Home / Self Care | Payer: Medicare Other | Source: Ambulatory Visit | Attending: Hematology | Admitting: Hematology

## 2014-07-10 ENCOUNTER — Other Ambulatory Visit: Payer: Self-pay | Admitting: *Deleted

## 2014-07-10 ENCOUNTER — Encounter (HOSPITAL_COMMUNITY): Payer: Self-pay

## 2014-07-10 DIAGNOSIS — Z7982 Long term (current) use of aspirin: Secondary | ICD-10-CM | POA: Insufficient documentation

## 2014-07-10 DIAGNOSIS — Z90710 Acquired absence of both cervix and uterus: Secondary | ICD-10-CM | POA: Insufficient documentation

## 2014-07-10 DIAGNOSIS — Z79899 Other long term (current) drug therapy: Secondary | ICD-10-CM | POA: Diagnosis not present

## 2014-07-10 DIAGNOSIS — Z9049 Acquired absence of other specified parts of digestive tract: Secondary | ICD-10-CM | POA: Insufficient documentation

## 2014-07-10 DIAGNOSIS — C50211 Malignant neoplasm of upper-inner quadrant of right female breast: Secondary | ICD-10-CM

## 2014-07-10 DIAGNOSIS — R590 Localized enlarged lymph nodes: Secondary | ICD-10-CM | POA: Insufficient documentation

## 2014-07-10 DIAGNOSIS — M1990 Unspecified osteoarthritis, unspecified site: Secondary | ICD-10-CM | POA: Diagnosis not present

## 2014-07-10 LAB — CBC
HCT: 36.4 % (ref 36.0–46.0)
Hemoglobin: 12.2 g/dL (ref 12.0–15.0)
MCH: 32.1 pg (ref 26.0–34.0)
MCHC: 33.5 g/dL (ref 30.0–36.0)
MCV: 95.8 fL (ref 78.0–100.0)
Platelets: 176 10*3/uL (ref 150–400)
RBC: 3.8 MIL/uL — ABNORMAL LOW (ref 3.87–5.11)
RDW: 12.8 % (ref 11.5–15.5)
WBC: 4.8 10*3/uL (ref 4.0–10.5)

## 2014-07-10 LAB — PROTIME-INR
INR: 0.97 (ref 0.00–1.49)
Prothrombin Time: 13 seconds (ref 11.6–15.2)

## 2014-07-10 LAB — APTT: aPTT: 25 seconds (ref 24–37)

## 2014-07-10 MED ORDER — LIDOCAINE-PRILOCAINE 2.5-2.5 % EX CREA: 1.0000 "application " | TOPICAL_CREAM | CUTANEOUS | Status: DC | PRN

## 2014-07-10 MED ORDER — MIDAZOLAM HCL 2 MG/2ML IJ SOLN
INTRAMUSCULAR | Status: AC
  Filled 2014-07-10: qty 6

## 2014-07-10 MED ORDER — MIDAZOLAM HCL 2 MG/2ML IJ SOLN
INTRAMUSCULAR | Status: AC | PRN
  Administered 2014-07-10: 1 mg via INTRAVENOUS

## 2014-07-10 MED ORDER — FENTANYL CITRATE 0.05 MG/ML IJ SOLN
INTRAMUSCULAR | Status: AC | PRN
  Administered 2014-07-10: 25 ug via INTRAVENOUS

## 2014-07-10 MED ORDER — NALOXONE HCL 0.4 MG/ML IJ SOLN
INTRAMUSCULAR | Status: AC
  Filled 2014-07-10: qty 1

## 2014-07-10 MED ORDER — FENTANYL CITRATE 0.05 MG/ML IJ SOLN
INTRAMUSCULAR | Status: AC
  Filled 2014-07-10: qty 4

## 2014-07-10 MED ORDER — SODIUM CHLORIDE 0.9 % IV SOLN
INTRAVENOUS | Status: DC
  Administered 2014-07-10: 08:00:00 via INTRAVENOUS

## 2014-07-10 MED ORDER — FLUMAZENIL 0.5 MG/5ML IV SOLN
INTRAVENOUS | Status: AC
  Filled 2014-07-10: qty 5

## 2014-07-10 NOTE — Procedures (Signed)
Technically successful US guided biopsy of left supraclavicular lymph node.  No immediate complications.

## 2014-07-10 NOTE — Discharge Instructions (Signed)
Needle Biopsy of Lung, Care After °Refer to this sheet in the next few weeks. These instructions provide you with information on caring for yourself after your procedure. Your health care provider may also give you more specific instructions. Your treatment has been planned according to current medical practices, but problems sometimes occur. Call your health care provider if you have any problems or questions after your procedure. °WHAT TO EXPECT AFTER THE PROCEDURE °· A bandage will be applied over the area where the needle was inserted. You may be asked to apply pressure to the bandage for several minutes to ensure there is minimal bleeding. °· In most cases, you can leave when your needle biopsy procedure is completed. Do not drive yourself home. Someone else should take you home. °· If you received an IV sedative or general anesthetic, you will be taken to a comfortable place to relax while the medicine wears off. °· If you have upcoming travel scheduled, talk to your health care provider about when it is safe to travel by air after the procedure. °HOME CARE INSTRUCTIONS °· Expect to take it easy for the rest of the day. °· Protect the area where you received the needle biopsy by keeping the bandage in place for as long as instructed. °· You may feel some mild pain or discomfort in the area, but this should stop in a day or two. °· Take medicines only as directed by your health care provider. °SEEK MEDICAL CARE IF:  °· You have pain at the biopsy site that worsens or is not helped by medicine. °· You have swelling or drainage at the needle biopsy site. °· You have a fever. °SEEK IMMEDIATE MEDICAL CARE IF:  °· You have new or worsening shortness of breath. °· You have chest pain. °· You are coughing up blood. °· You have bleeding that does not stop with pressure or a bandage. °· You develop light-headedness or fainting. °Document Released: 01/09/2007 Document Revised: 07/29/2013 Document Reviewed:  08/06/2012 °Exit[employer]® Patient Information ©2015 [employer]. This information is not intended to replace advice given to you by your health care provider. Make sure you discuss any questions you have with your health care provider. °Conscious Sedation, Adult, Care After °Refer to this sheet in the next few weeks. These instructions provide you with information on caring for yourself after your procedure. Your health care provider may also give you more specific instructions. Your treatment has been planned according to current medical practices, but problems sometimes occur. Call your health care provider if you have any problems or questions after your procedure. °WHAT TO EXPECT AFTER THE PROCEDURE  °After your procedure: °· You may feel sleepy, clumsy, and have poor balance for several hours. °· Vomiting may occur if you eat too soon after the procedure. °HOME CARE INSTRUCTIONS °· Do not participate in any activities where you could become injured for at least 24 hours. Do not: °¨ Drive. °¨ Swim. °¨ Ride a bicycle. °¨ Operate heavy machinery. °¨ Cook. °¨ Use power tools. °¨ Climb ladders. °¨ Work from a high place. °· Do not make important decisions or sign legal documents until you are improved. °· If you vomit, drink water, juice, or soup when you can drink without vomiting. Make sure you have little or no nausea before eating solid foods. °· Only take over-the-counter or prescription medicines for pain, discomfort, or fever as directed by your health care provider. °· Make sure you and your family fully understand everything about the   medicines given to you, including what side effects may occur. °· You should not drink alcohol, take sleeping pills, or take medicines that cause drowsiness for at least 24 hours. °· If you smoke, do not smoke without supervision. °· If you are feeling better, you may resume normal activities 24 hours after you were sedated. °· Keep all appointments with your health care  provider. °SEEK MEDICAL CARE IF: °· Your skin is pale or bluish in color. °· You continue to feel nauseous or vomit. °· Your pain is getting worse and is not helped by medicine. °· You have bleeding or swelling. °· You are still sleepy or feeling clumsy after 24 hours. °SEEK IMMEDIATE MEDICAL CARE IF: °· You develop a rash. °· You have difficulty breathing. °· You develop any type of allergic problem. °· You have a fever. °MAKE SURE YOU: °· Understand these instructions. °· Will watch your condition. °· Will get help right away if you are not doing well or get worse. °Document Released: 01/02/2013 Document Reviewed: 01/02/2013 °ExitCare® Patient Information ©2015 [employer]. This information is not intended to replace advice given to you by your health care provider. Make sure you discuss any questions you have with your health care provider. ° °

## 2014-07-10 NOTE — H&P (Signed)
Referring Physician(s): Feng,Yan  History of Present Illness: Kristin Pennington is a 69 y.o. female with newly diagnosed right breast cancer, PET 07/02/14 with hypermetabolic lymph nodes. The patient was seen by Dr. Burr Medico on 07/07/14 and scheduled today for image guided left supraclavicular lymph node biopsy. She denies any chest pain, shortness of breath or palpitations. She denies any active signs of bleeding or excessive bruising. The patient denies any history of sleep apnea or chronic oxygen use. She has previously tolerated sedation without complications. The patient has had a H&P performed within the last 30 days, all history, medications, and exam have been reviewed. The patient denies any interval changes since the H&P.   Past Medical History  Diagnosis Date  . Breast cancer   . Arthritis   . Wears glasses   . Wears partial dentures     top  . Seizures     last 2006    Past Surgical History  Procedure Laterality Date  . Appendectomy    . Cholecystectomy    . Abdominal hysterectomy    . Tonsillectomy    . Colonoscopy    . Portacath placement N/A 06/30/2014    Procedure: INSERTION PORT-A-CATH WITH ULTRA SOUND ON STANDBY;  Surgeon: Fanny Skates, MD;  Location: [facility];  Service: General;  Laterality: N/A;    Allergies: Aspirin; Codeine; Diclofenac; Meloxicam; Nitrofurantoin; Other; Pyridium; Sulfa antibiotics; Tetracyclines & related; and Tape  Medications: Prior to Admission medications   Medication Sig Start Date End Date Taking? Authorizing Provider  aspirin EC 81 MG tablet Take 81 mg by mouth daily.     Historical Provider, MD  calcium carbonate (OS-CAL) 600 MG TABS tablet Take 600 mg by mouth 2 (two) times daily with a meal.    Historical Provider, MD  cyanocobalamin (,VITAMIN B-12,) 1000 MCG/ML injection Inject 1,000 mcg into the muscle every 30 (thirty) days.     Historical Provider, MD  HYDROcodone-acetaminophen (NORCO) 5-325 MG per tablet Take 1-2  tablets by mouth every 6 (six) hours as needed for moderate pain or severe pain. 06/30/14   Fanny Skates, MD  levETIRAcetam (KEPPRA) 500 MG tablet Take 1 tablet by mouth 2 (two) times daily. 04/05/14   Historical Provider, MD  oxaprozin (DAYPRO) 600 MG tablet Take 1 tablet by mouth 2 (two) times daily.    Historical Provider, MD  phenytoin (DILANTIN) 100 MG ER capsule Take 1 tablet by mouth 4 (four) times daily. For 5 days a week    Historical Provider, MD  phenytoin (DILANTIN) 100 MG ER capsule Take 100 mg by mouth 3 (three) times daily. For  2 days a week.    Historical Provider, MD     Family History  Problem Relation Age of Onset  . Cancer Father 95    gastric cancer   . Cancer Sister 62    lung cancer     History   Social History  . Marital Status: Married    Spouse Name: N/A  . Number of Children: N/A  . Years of Education: N/A   Social History Main Topics  . Smoking status: Former Smoker -- 1.00 packs/day for 18 years    Quit date: 03/28/1988  . Smokeless tobacco: Not on file  . Alcohol Use: No  . Drug Use: No  . Sexual Activity: Not on file   Other Topics Concern  . None   Social History Narrative   Review of Systems: A 12 point ROS discussed and pertinent positives are  indicated in the HPI above.  All other systems are negative.  Review of Systems  Vital Signs: T: 97.51F, HR: 69 bpm, BP: 131/69 mmHg, O2: 100 %RA  Physical Exam  Constitutional: She is oriented to person, place, and time. No distress.  HENT:  Head: Normocephalic and atraumatic.  Neck: No tracheal deviation present.  Cardiovascular: Normal rate and regular rhythm.  Exam reveals no gallop and no friction rub.   No murmur heard. Pulmonary/Chest: Effort normal and breath sounds normal. No respiratory distress. She has no wheezes. She has no rales.  Abdominal: Soft. Bowel sounds are normal. She exhibits no distension. There is no tenderness.  Neurological: She is alert and oriented to person, place,  and time.  Skin: She is not diaphoretic.    Mallampati Score:  MD Evaluation Airway: WNL Heart: WNL Abdomen: WNL Chest/ Lungs: WNL ASA  Classification: 3 Mallampati/Airway Score: Two  Imaging: Chest 2 View  06/25/2014   CLINICAL DATA:  Preoperative examination prior Port-A-Cath insertion; history of breast malignancy and beginning chemotherapy; history of tobacco use.  EXAM: CHEST  2 VIEW  COMPARISON:  None.  FINDINGS: The lungs are adequately inflated. There is minimal subsegmental atelectasis or scarring at the right lung base. The heart and pulmonary vascularity are normal. There is mild tortuosity of the descending thoracic aorta. There is no pleural effusion. There is multilevel degenerative disc disease of the thoracic spine. There are old deformities of the lateral aspects of the seventh and eighth ribs on the right.  IMPRESSION: Minimal atelectasis versus scarring at the right lung base. The examination is otherwise unremarkable.   Electronically Signed   By: David  Martinique   On: 06/25/2014 14:49   Mr Breast Bilateral W Wo Contrast  06/24/2014   CLINICAL DATA:  Recently diagnosed right breast invasive mammary carcinoma.  LABS:  None obtained today.  EXAM: BILATERAL BREAST MRI WITH AND WITHOUT CONTRAST  TECHNIQUE: Multiplanar, multisequence MR images of both breasts were obtained prior to and following the intravenous administration of 14 ml of MultiHance.  THREE-DIMENSIONAL MR IMAGE RENDERING ON INDEPENDENT WORKSTATION:  Three-dimensional MR images were rendered by post-processing of the original MR data on an independent workstation. The three-dimensional MR images were interpreted, and findings are reported in the following complete MRI report for this study. Three dimensional images were evaluated at the independent DynaCad workstation  COMPARISON:  Previous mammogram, ultrasound and biopsy examinations.  FINDINGS: Breast composition: b. Scattered fibroglandular tissue.  Background  parenchymal enhancement: Mild  Right breast: 3.6 x 3.3 x 2.8 cm rounded, lobulated, enhancing mass in the posterior aspect of the upper inner quadrant. This corresponds to the recently biopsied invasive mammary carcinoma and has central nonenhancement compatible with necrosis. The mass has a mixture of enhancement kinetics, including rapid wash-in/washout. There are post biopsy changes with enhancement extending medially from this area to the skin. No findings elsewhere in the right breast suspicious for malignancy.  Left breast: 3.8 x 2.0 x 1.1 cm oval enhancing mass-like area in the midportion of the breast in the upper outer quadrant in approximately the 1 o'clock position. This has plateau enhancement kinetics. No findings elsewhere in the left breast suspicious for malignancy.  Lymph nodes: No abnormal appearing lymph nodes.  Ancillary findings:  None.  IMPRESSION: 1. 3.6 cm biopsy-proven invasive mammary carcinoma in the posterior aspect of the upper inner quadrant of the right breast. 2. 3.8 cm oval enhancing mass-like area in the upper outer quadrant of the left breast. 3. No adenopathy.  RECOMMENDATION: Left breast targeted ultrasound and ultrasound-guided core needle biopsy of the 3.8 cm oval mass-like area. If this cannot be seen by ultrasound, MR guided core needle biopsy of this would be recommended. We will arrange the ultrasound and biopsy.  BI-RADS CATEGORY  4: Suspicious.   Electronically Signed   By: Claudie Revering M.D.   On: 06/24/2014 16:48   Nm Pet Image Initial (pi) Skull Base To Thigh  07/02/2014   CLINICAL DATA:  Initial treatment strategy for breast carcinoma.  EXAM: NUCLEAR MEDICINE PET SKULL BASE TO THIGH  TECHNIQUE: 8.2 mCi F-18 FDG was injected intravenously. Full-ring PET imaging was performed from the skull base to thigh after the radiotracer. CT data was obtained and used for attenuation correction and anatomic localization.  FASTING BLOOD GLUCOSE:  Value: 96 mg/dl  COMPARISON:  None.   FINDINGS: NECK  Hypermetabolic LEFT level 2 lymph node measures 8 mm short axis with SUV max 7.0 (image 26 PET-CT data set). Hypermetabolic LEFT supraclavicular node measures 9 mm (image 2) with SUV 9 3.  CHEST  Hypermetabolic mass in the medial RIGHT breast measured 3.9 cm with SUV max equal 22.5.  Hypermetabolic right axillary lymph node image.  There is moderately hypermetabolic pulmonary hilar activity.  Review of the lung parenchyma demonstrates no pulmonary nodules.  ABDOMEN/PELVIS  Hypermetabolic periportal lymph node measuring 8 mm short axis with moderate metabolic activity (SUV 5)  No abnormal metabolic liver activity. No retroperitoneal or pelvic lymph nodes.  SKELETON  No focal hypermetabolic activity to suggest skeletal metastasis.  IMPRESSION: 1. Intensely hypermetabolic mass in the medial RIGHT chest wall. 2. Hypermetabolic nodal metastasis in the LEFT level 2 and LEFT supraclavicular nodal stations. 3. Local nodal metastasis to the RIGHT axilla. Concern for nodal metastasis to the bilateral pulmonary hila. 4. Probable nodal metastasis to a periportal lymph node.   Electronically Signed   By: Suzy Bouchard M.D.   On: 07/02/2014 16:34   Dg Chest Port 1 View  06/30/2014   CLINICAL DATA:  Port-A-Cath insertion  EXAM: PORTABLE CHEST - 1 VIEW  COMPARISON:  06/25/2014  FINDINGS: The left subclavian Port-A-Cath tip is in the distal SVC near the cavoatrial junction. No complicating features. The cardiac silhouette, mediastinal and hilar contours are stable. Low lung volumes with vascular crowding and atelectasis but no infiltrates, edema, effusions or pneumothorax.  IMPRESSION: Left subclavian Port-A-Cath in good position without complicating features.   Electronically Signed   By: Marijo Sanes M.D.   On: 06/30/2014 12:43   Mm Digital Diagnostic Unilat L  07/04/2014   CLINICAL DATA:  Post biopsy clip mammograms following MRI guided biopsy of the left breast.  EXAM: DIAGNOSTIC LEFT MAMMOGRAM POST MRI  BIOPSY  COMPARISON:  Previous exam(s).  FINDINGS: Mammographic images were obtained following MRI guided biopsy of an area of focal abnormal enhancement in the upper outer left breast. Dumbbell shaped biopsy clip lies the biopsy cavity in the expected location of the abnormal MRI enhancement.  IMPRESSION: Well-positioned dumbbell shaped biopsy clip in the left breast following MRI guided biopsy.  Final Assessment: Post Procedure Mammograms for Marker Placement   Electronically Signed   By: Lajean Manes M.D.   On: 07/04/2014 09:04   Dg Fluoro Guide Cv Line-no Report  06/30/2014   CLINICAL DATA:    FLOURO GUIDE CV LINE  Fluoroscopy was utilized by the requesting physician.  No radiographic  interpretation.    US Breast Ltd Uni Left Inc Axilla  06/27/2014   CLINICAL DATA:  Second-look ultrasound for abnormal MR enhancement within the upper outer left breast. Recently diagnosed right breast cancer.  EXAM: ULTRASOUND OF THE LEFT BREAST  COMPARISON:  Prior exams including MR dated 06/24/2014.  FINDINGS: On physical exam, no discrete palpable abnormalities are identified within the upper left breast.  Targeted ultrasound is performed, showing scattered areas of shadowing from normal breast tissue identified. I cannot identify with certainty an area that corresponds to the abnormal MR enhancement within the upper left breast.  IMPRESSION: Abnormal upper left breast MR enhancement is not identified sonographically with certainty. MR guided left breast biopsy is recommended.  RECOMMENDATION: MR guided left breast biopsy. This has been scheduled for 07/04/2014 and the patient informed.  I have discussed the findings and recommendations with the patient. Results were also provided in writing at the conclusion of the visit. If applicable, a reminder letter will be sent to the patient regarding the next appointment.  BI-RADS CATEGORY  1: Negative   Electronically Signed   By: Margarette Canada M.D.   On: 06/27/2014 14:40   Mr Aundra Millet  Breast Bx Johnella Moloney Dev 1st Lesion Image Bx Spec Mr Guide  07/04/2014   CLINICAL DATA:  Patient has a recently diagnosed malignancy in the right breast and an area of abnormal enhancement in the upper outer left breast. She presents for MRI guided biopsy of this area of abnormal left breast enhancement.  EXAM: MRI GUIDED CORE NEEDLE BIOPSY OF THE LEFT BREAST  TECHNIQUE: Multiplanar, multisequence MR imaging of the left breast was performed both before and after administration of intravenous contrast.  CONTRAST:  15 mL of MultiHance intravenous contrast  COMPARISON:  Previous exams.  FINDINGS: I met with the patient, and we discussed the procedure of MRI guided biopsy, including risks, benefits, and alternatives. Specifically, we discussed the risks of infection, bleeding, tissue injury, clip migration, and inadequate sampling. Informed, written consent was given. The usual time out protocol was performed immediately prior to the procedure.  Using sterile technique, 2% Lidocaine, MRI guidance, and a 9 gauge vacuum assisted device, biopsy was performed of the focal area of abnormal enhancement in the upper outer left breast using a lateral approach. At the conclusion of the procedure, a dumbbell shaped tissue marker clip was deployed into the biopsy cavity. Follow-up 2-view mammogram was performed and dictated separately.  IMPRESSION: MRI guided biopsy of the left breast. No apparent complications.   Electronically Signed   By: Lajean Manes M.D.   On: 07/04/2014 09:02    Labs:  CBC:  Recent Labs  06/18/14 0813 06/25/14 1355 07/10/14 0730  WBC 5.7 4.8 4.8  HGB 12.9 12.4 12.2  HCT 38.4 36.5 36.4  PLT 197 196 176    COAGS:  Recent Labs  07/10/14 0730  INR 0.97  APTT 25    BMP:  Recent Labs  06/18/14 0814 06/25/14 1355  NA 141 139  K 4.5 3.8  CL  --  103  CO2 28 26  GLUCOSE 74 176*  BUN 14.7 16  CALCIUM 8.9 9.0  CREATININE 0.7 0.67  GFRNONAA  --  88*  GFRAA  --  >90    LIVER  FUNCTION TESTS:  Recent Labs  06/18/14 0814 06/25/14 1355  BILITOT 0.21 0.3  AST 17 22  ALT 19 17  ALKPHOS 105 98  PROT 6.9 6.6  ALBUMIN 3.4* 3.6    Assessment and Plan: Newly diagnosed right breast cancer PET 07/02/14 with hypermetabolic lymph nodes Seen by Dr. Burr Medico on 07/07/14  Scheduled today for image guided left supraclavicular lymph node biopsy with moderate sedation The patient has been NPO, no blood thinners taken, labs and vitals have been reviewed. Risks and Benefits discussed with the patient including, but not limited to bleeding, infection, damage to adjacent structures or low yield requiring additional tests. All of the patient's questions were answered, patient is agreeable to proceed. Consent signed and in chart.   SignedHedy Jacob 07/10/2014, 8:27 AM

## 2014-07-10 NOTE — Discharge Instructions (Signed)
Needle Biopsy Care After These instructions give you information on caring for yourself after your procedure. Your doctor may also give you more specific instructions. Call your doctor if you have any problems or questions after your procedure. HOME CARE  Rest for 4 hours after your biopsy, except for getting up to go to the bathroom or as told.  Keep the places where the needles were put in clean and dry. Do not put powder or lotion on the sites.Fine Needle Aspiration Fine needle aspiration is a procedure used to remove a piece of tissue. The tissue may be removed from a swelling or abnormal growth (tumor). It is also used to confirm a cyst. A cyst is a fluid filled sac. The procedure may be done by your caregiver or a specialist. LET YOUR CAREGIVER KNOW ABOUT:   Any medications you take, especially blood thinners like aspirin.  Any problems you may have had with similar procedures in the past. RISKS AND COMPLICATIONS This is a safe procedure. There is a very small risk of infection and or bleeding. Other complications can occur if the aspiration site is deep in the body. Your caregiver or specialist will explain this to you.  BEFORE THE PROCEDURE   No special preparation is needed in most cases. Your caregiver will let you know if there are special requirements, such as an empty stomach.  Be sure to ask your caregiver any questions before the procedure starts. PROCEDURE   This procedure is done under local or no anesthetic.  The skin is cleaned carefully.  A thin needle is directed into a lump. The needle is directed in several different directions into the lump while suction is applied to the needle. When samples are removed, the needle is withdrawn.  The contents obtained are placed on a slide. They are then fixed, stained and examined under the microscope. The slide is examined by a specialist in the examination of tissue (pathologist).  A diagnosis can then be made. The  pathologist will decide if the specimen is cancerous (malignant) or not cancerous (benign). If fluid is taken from a cyst, cells from the fluid can be examined. If no material is obtained from a fine needle aspiration, the sample may not rule out a problem. Sometimes the procedure is done again. The pathologist may need several days before a result is available. AFTER THE PROCEDURE   There are usually no limits on diet or activity after a fine needle aspiration.  Your caregiver may give you instructions regarding the aspiration site. This will include information about keeping the site clean and dry. Follow these instructions carefully.  Call for your test results as instructed by your caregiver. Remember, it is your responsibility to get the results of your testing. Do not think everything is fine if you have not heard from your caregiver.  Keep a close watch on the aspiration site. Report any redness, swelling or drainage.  You should not have much pain at the aspiration site.  Take any medications as told by your caregiver. SEEK MEDICAL CARE IF:   You have pain or drainage at the aspiration site that does not go away.  Swelling at the aspiration site does not gradually go away. SEEK IMMEDIATE MEDICAL CARE IF:   You develop a fever a day or two after the aspiration.  You develop severe pain at the aspiration site.  You develop a warm, tender swelling at the aspiration site. Document Released: 03/11/2000 Document Revised: 06/06/2011 Document Reviewed: 11/23/2007 [employer]Care  Patient Information 2015 [place]. This information is not intended to replace advice given to you by your health care provider. Make sure you discuss any questions you have with your health care provider.    Do not shower until 24 hours after the test. Remove all bandages (dressings) before showering.  Remove all bandages at least once every day. Gently clean the sites with soap and water. Keep putting a new  bandage on until the skin is closed. Finding out the results of your test Ask your doctor when your test results will be ready. Make sure you follow up and get the test results. GET HELP RIGHT AWAY IF:   You have shortness of breath or trouble breathing.  You have pain or cramping in your belly (abdomen).  You feel sick to your stomach (nauseous) or throw up (vomit).  Any of the places where the needles were put in:  Are puffy (swollen) or red.  Are sore or hot to the touch.  Are draining yellowish-white fluid (pus).  Are bleeding after 10 minutes of pressing down on the site. Have someone keep pressing on any place that is bleeding until you see a doctor.  You have any unusual pain that will not stop.  You have a fever. If you go to the emergency room, tell the nurse that you had a biopsy. Take this paper with you to show the nurse. MAKE SURE YOU:   Understand these instructions.  Will watch your condition.  Will get help right away if you are not doing well or get worse. Document Released: 02/25/2008 Document Revised: 06/06/2011 Document Reviewed: 02/25/2008 [facility] Patient Information 2015 [place], [place]. This information is not intended to replace advice given to you by your health care provider. Make sure you discuss any questions you have with your health care provider. Conscious Sedation Sedation is the use of medicines to promote relaxation and relieve discomfort and anxiety. Conscious sedation is a type of sedation. Under conscious sedation you are less alert than normal but are still able to respond to instructions or stimulation. Conscious sedation is used during short medical and dental procedures. It is milder than deep sedation or general anesthesia and allows you to return to your regular activities sooner.  LET [facility] CARE PROVIDER KNOW ABOUT:   Any allergies you have.  All medicines you are taking, including vitamins, herbs, eye drops, creams, and  over-the-counter medicines.  Use of steroids (by mouth or creams).  Previous problems you or members of your family have had with the use of anesthetics.  Any blood disorders you have.  Previous surgeries you have had.  Medical conditions you have.  Possibility of pregnancy, if this applies.  Use of cigarettes, alcohol, or illegal drugs. RISKS AND COMPLICATIONS Generally, this is a safe procedure. However, as with any procedure, problems can occur. Possible problems include:  Oversedation.  Trouble breathing on your own. You may need to have a breathing tube until you are awake and breathing on your own.  Allergic reaction to any of the medicines used for the procedure. BEFORE THE PROCEDURE  You may have blood tests done. These tests can help show how well your kidneys and liver are working. They can also show how well your blood clots.  A physical exam will be done.  Only take medicines as directed by your health care provider. You may need to stop taking medicines (such as blood thinners, aspirin, or nonsteroidal anti-inflammatory drugs) before the procedure.   Do  not eat or drink at least 6 hours before the procedure or as directed by your health care provider.  Arrange for a responsible adult, family member, or friend to take you home after the procedure. He or she should stay with you for at least 24 hours after the procedure, until the medicine has worn off. PROCEDURE   An intravenous (IV) catheter will be inserted into one of your veins. Medicine will be able to flow directly into your body through this catheter. You may be given medicine through this tube to help prevent pain and help you relax.  The medical or dental procedure will be done. AFTER THE PROCEDURE  You will stay in a recovery area until the medicine has worn off. Your blood pressure and pulse will be checked.   Depending on the procedure you had, you may be allowed to go home when you can tolerate  liquids and your pain is under control. Document Released: 12/07/2000 Document Revised: 03/19/2013 Document Reviewed: 11/19/2012 [facility] Patient Information 2015 [place], [place]. This information is not intended to replace advice given to you by your health care provider. Make sure you discuss any questions you have with your health care provider.

## 2014-07-11 ENCOUNTER — Telehealth: Payer: Self-pay | Admitting: *Deleted

## 2014-07-11 NOTE — Telephone Encounter (Signed)
TC from patient regarding prescription for EMLA cream. Reviewed chart and noted that it had been ordered through [facility] in [place] , [place].  Pt. Thought it would be at [facility] in [place]. She will call the first pharmacy to see if it is ready.

## 2014-07-14 ENCOUNTER — Other Ambulatory Visit (HOSPITAL_COMMUNITY): Payer: Medicare Other

## 2014-07-14 ENCOUNTER — Other Ambulatory Visit: Payer: Self-pay | Admitting: Hematology

## 2014-07-14 ENCOUNTER — Telehealth: Payer: Self-pay | Admitting: *Deleted

## 2014-07-14 NOTE — Telephone Encounter (Signed)
DR.INGRAM PLACED A COVERING OVER PT.'S PAC AND SAID IT WOULD COME OFF IN A FEW DAYS. THE COVERING IS STILL IN PLACE AND PT. IS TO PLACE EMLA CREAM ON PORT BEFORE CHEMOTHERAPY ON Friday. INSTRUCTED PT. TO CALL DR.INGRAM'S OFFICE. SHE VOICES UNDERSTANDING.

## 2014-07-16 ENCOUNTER — Other Ambulatory Visit: Payer: Self-pay | Admitting: *Deleted

## 2014-07-16 ENCOUNTER — Other Ambulatory Visit: Payer: Self-pay | Admitting: Hematology

## 2014-07-16 DIAGNOSIS — C50211 Malignant neoplasm of upper-inner quadrant of right female breast: Secondary | ICD-10-CM

## 2014-07-17 ENCOUNTER — Other Ambulatory Visit: Payer: Self-pay | Admitting: Emergency Medicine

## 2014-07-17 ENCOUNTER — Telehealth: Payer: Self-pay | Admitting: Emergency Medicine

## 2014-07-17 ENCOUNTER — Other Ambulatory Visit (HOSPITAL_COMMUNITY): Payer: Medicare Other

## 2014-07-17 DIAGNOSIS — R590 Localized enlarged lymph nodes: Secondary | ICD-10-CM

## 2014-07-17 NOTE — Telephone Encounter (Signed)
We reviewed PET scan and case in thoracic conference this morning. I discussed the PET scan with Dr. Pablo Ledger. The patient has mediastinal and perihilar adenopathy that is probably amenable to needle biopsy via EBUS. I called Mrs. Gasser and discussed that procedure with her, explained how and where the test is done. I asked her to discuss the possible need for nodal biopsy with Dr. Burr Medico tomorrow when she comes to [facility] for chemotherapy and lab work. If the patient agrees with Drs. Pablo Ledger and Phillips that mediastinal nodal biopsy is indicated then I will set up the endobronchial ultrasound.

## 2014-07-18 ENCOUNTER — Encounter: Payer: Self-pay | Admitting: *Deleted

## 2014-07-18 ENCOUNTER — Ambulatory Visit (HOSPITAL_BASED_OUTPATIENT_CLINIC_OR_DEPARTMENT_OTHER): Payer: Medicare Other | Admitting: Hematology

## 2014-07-18 ENCOUNTER — Telehealth: Payer: Self-pay | Admitting: Hematology

## 2014-07-18 ENCOUNTER — Other Ambulatory Visit (HOSPITAL_BASED_OUTPATIENT_CLINIC_OR_DEPARTMENT_OTHER): Payer: Medicare Other

## 2014-07-18 ENCOUNTER — Ambulatory Visit (HOSPITAL_BASED_OUTPATIENT_CLINIC_OR_DEPARTMENT_OTHER): Payer: Medicare Other

## 2014-07-18 VITALS — BP 118/70 | HR 63 | Temp 97.7°F | Resp 18 | Ht 64.0 in | Wt 163.0 lb

## 2014-07-18 DIAGNOSIS — C50211 Malignant neoplasm of upper-inner quadrant of right female breast: Secondary | ICD-10-CM

## 2014-07-18 DIAGNOSIS — Z5111 Encounter for antineoplastic chemotherapy: Secondary | ICD-10-CM | POA: Diagnosis present

## 2014-07-18 DIAGNOSIS — C50911 Malignant neoplasm of unspecified site of right female breast: Secondary | ICD-10-CM

## 2014-07-18 LAB — CBC WITH DIFFERENTIAL/PLATELET
BASO%: 0.8 % (ref 0.0–2.0)
Basophils Absolute: 0 10*3/uL (ref 0.0–0.1)
EOS%: 3.9 % (ref 0.0–7.0)
Eosinophils Absolute: 0.2 10*3/uL (ref 0.0–0.5)
HCT: 36.4 % (ref 34.8–46.6)
HGB: 12.2 g/dL (ref 11.6–15.9)
LYMPH%: 27.3 % (ref 14.0–49.7)
MCH: 31.9 pg (ref 25.1–34.0)
MCHC: 33.5 g/dL (ref 31.5–36.0)
MCV: 95.4 fL (ref 79.5–101.0)
MONO#: 0.5 10*3/uL (ref 0.1–0.9)
MONO%: 8.8 % (ref 0.0–14.0)
NEUT#: 3.6 10*3/uL (ref 1.5–6.5)
NEUT%: 59.2 % (ref 38.4–76.8)
Platelets: 218 10*3/uL (ref 145–400)
RBC: 3.82 10*6/uL (ref 3.70–5.45)
RDW: 13 % (ref 11.2–14.5)
WBC: 6.1 10*3/uL (ref 3.9–10.3)
lymph#: 1.7 10*3/uL (ref 0.9–3.3)

## 2014-07-18 LAB — COMPREHENSIVE METABOLIC PANEL (CC13)
ALT: 13 U/L (ref 0–55)
AST: 17 U/L (ref 5–34)
Albumin: 3.5 g/dL (ref 3.5–5.0)
Alkaline Phosphatase: 117 U/L (ref 40–150)
Anion Gap: 12 mEq/L — ABNORMAL HIGH (ref 3–11)
BUN: 19.4 mg/dL (ref 7.0–26.0)
CO2: 26 mEq/L (ref 22–29)
Calcium: 8.9 mg/dL (ref 8.4–10.4)
Chloride: 105 mEq/L (ref 98–109)
Creatinine: 0.7 mg/dL (ref 0.6–1.1)
EGFR: 84 mL/min/{1.73_m2} — ABNORMAL LOW (ref >90)
Glucose: 83 mg/dl (ref 70–140)
Potassium: 4.7 mEq/L (ref 3.5–5.1)
Sodium: 142 mEq/L (ref 136–145)
Total Bilirubin: <0.2 mg/dL (ref 0.20–1.20)
Total Protein: 6.8 g/dL (ref 6.4–8.3)

## 2014-07-18 MED ORDER — ONDANSETRON HCL 8 MG PO TABS: 8.0000 mg | ORAL_TABLET | Freq: Two times a day (BID) | ORAL | Status: DC | PRN

## 2014-07-18 MED ORDER — SODIUM CHLORIDE 0.9 % IV SOLN
Freq: Once | INTRAVENOUS | Status: AC
  Administered 2014-07-18: 10:00:00 via INTRAVENOUS
  Filled 2014-07-18: qty 5

## 2014-07-18 MED ORDER — PALONOSETRON HCL INJECTION 0.25 MG/5ML
0.2500 mg | Freq: Once | INTRAVENOUS | Status: AC
  Administered 2014-07-18: 0.25 mg via INTRAVENOUS

## 2014-07-18 MED ORDER — HEPARIN SOD (PORK) LOCK FLUSH 100 UNIT/ML IV SOLN
500.0000 [IU] | Freq: Once | INTRAVENOUS | Status: AC | PRN
  Administered 2014-07-18: 500 [IU]
  Filled 2014-07-18: qty 5

## 2014-07-18 MED ORDER — PALONOSETRON HCL INJECTION 0.25 MG/5ML
INTRAVENOUS | Status: AC
  Filled 2014-07-18: qty 5

## 2014-07-18 MED ORDER — PROCHLORPERAZINE MALEATE 10 MG PO TABS: 10.0000 mg | ORAL_TABLET | Freq: Four times a day (QID) | ORAL | Status: DC | PRN

## 2014-07-18 MED ORDER — CYCLOPHOSPHAMIDE CHEMO INJECTION 1 GM
600.0000 mg/m2 | Freq: Once | INTRAMUSCULAR | Status: AC
  Administered 2014-07-18: 1100 mg via INTRAVENOUS
  Filled 2014-07-18: qty 55

## 2014-07-18 MED ORDER — SODIUM CHLORIDE 0.9 % IJ SOLN
10.0000 mL | INTRAMUSCULAR | Status: DC | PRN
  Administered 2014-07-18: 10 mL
  Filled 2014-07-18: qty 10

## 2014-07-18 MED ORDER — SODIUM CHLORIDE 0.9 % IV SOLN
Freq: Once | INTRAVENOUS | Status: AC
  Administered 2014-07-18: 10:00:00 via INTRAVENOUS

## 2014-07-18 MED ORDER — DOXORUBICIN HCL CHEMO IV INJECTION 2 MG/ML
60.0000 mg/m2 | Freq: Once | INTRAVENOUS | Status: AC
  Administered 2014-07-18: 110 mg via INTRAVENOUS
  Filled 2014-07-18: qty 55

## 2014-07-18 MED ORDER — ZOLPIDEM TARTRATE 5 MG PO TABS: 5.0000 mg | ORAL_TABLET | Freq: Every evening | ORAL | Status: DC | PRN

## 2014-07-18 NOTE — Progress Notes (Signed)
Went to see pt for 1st chemotherapy infusion. Pt resting quietly. Did not disturb her. Informed pt companion to have the pt call with any questions, concerns or needs.

## 2014-07-18 NOTE — Telephone Encounter (Signed)
Pt confirmed labs/ov per 04/21 POF, gave pt AVS and Calendar.... KJ, sent msg to add chemo and lft msg at [facility] to schedule pt for Neulasta injection on 04/23 and 05/07 and that they need to contact pt confirming time, pt's husband will come by before leaving chemo to see if injection has been added.... KJ

## 2014-07-18 NOTE — Progress Notes (Signed)
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 Ultrasound of the right axilla is negative   06/05/2014 Pathology Results INVASIVE MAMMARY CARCINOMA   06/05/2014 Receptors her2 Estrogen Receptor: 0%, NEGATIVE Progesterone Receptor: 0%, NEGATIVE Proliferation Marker Ki67: 98%  HER-2/NEU BY CISH - NEGATIVE   06/11/2014 Initial Diagnosis Breast cancer of upper-inner quadrant of right female breast   07/02/2014 PET scan 1. Intensely hypermetabolic mass in the medial RIGHT chest wall with hypermetabolic right axillary node 2. Hypermetabolic nodal metastasis in the LEFT level 2 and LEFT supraclavicular and periportal node (SUV 5).    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 female is here because of recently diagnosed poorly differentiated carcinoma in her right breast.  She found a lump in her right breast 3 weeks ago, mild tenderness,  No other complains. Last mammo was in oct 2015 which was negative. She feels well overall. She denies any pain, cough, shortness breast, GI symptoms. She has good energy level and appetite, no recent weight loss.  Her last colonoscopy was for 5 years ago which was negative per patient. Her last Pap smear was 3 years ago which was negative per patient.  INTERIM HISTORY: Kristin Pennington returns for follow-up and the first cycle chemotherapy. She tolerated the left side supraclavicular lymph node biopsy very well, no complications. She otherwise feels well, denies any new complaints.  MEDICAL HISTORY:  Past Medical History  Diagnosis Date  . Breast cancer   . Arthritis   . Wears glasses   . Wears partial dentures     top  . Seizures     last 2006     SURGICAL HISTORY: Past Surgical History  Procedure Laterality Date  . Appendectomy    . Cholecystectomy    . Abdominal hysterectomy    . Tonsillectomy    . Colonoscopy    . Portacath placement N/A 06/30/2014    Procedure: INSERTION PORT-A-CATH WITH ULTRA SOUND ON STANDBY;  Surgeon: Fanny Skates, MD;  Location: [facility];  Service: General;  Laterality: N/A;   SOCIAL HISTORY: History   Social History  . Marital Status: Married    Spouse Name: N/A  . Number of Children: N/A  . Years of Education:  N/A   Occupational History  . Not on file.   Social History Main Topics  . Smoking status: Former Smoker -- 1.00 packs/day for 18 years    Quit date: 03/28/1988  . Smokeless tobacco: Not on file  . Alcohol Use: No  . Drug Use: No  . Sexual Activity: Not on file   Other Topics Concern  . Not on file   Social History Narrative     GYN HISTORY  Menarchal: 11 LMP: 1994, hysrectomy  Contraceptive: no    HRT: since 31  G2P1, one miscarriage     FAMILY HISTORY: Family History  Problem Relation Age of Onset  . Cancer Father 87    gastric cancer   . Cancer Sister 32    lung cancer     ALLERGIES:  is allergic to aspirin; codeine; diclofenac; meloxicam; nitrofurantoin; other; pyridium; sulfa antibiotics; tetracyclines & related; and tape.  MEDICATIONS:  Current Outpatient Prescriptions  Medication Sig Dispense Refill  . aspirin EC 81 MG tablet Take 81 mg by mouth daily.     . calcium carbonate (OS-CAL) 600 MG TABS tablet Take 600 mg by mouth 2 (two) times daily with a meal.    . cyanocobalamin (,VITAMIN B-12,) 1000 MCG/ML injection Inject 1,000 mcg into the muscle every 30 (thirty) days.     Marland Kitchen HYDROcodone-acetaminophen (NORCO) 5-325 MG per tablet Take 1-2 tablets by mouth every 6 (six) hours as needed for moderate pain or severe pain. 30 tablet 0  . levETIRAcetam (KEPPRA) 500 MG tablet Take 1 tablet by mouth 2 (two) times daily.    Marland Kitchen lidocaine-prilocaine (EMLA) cream Apply 1 application topically as needed. Apply to portacath  1 hour to 1 1/2 hours prior to procedures as needed.   Cover with clear plastic. 30 g 1  . oxaprozin (DAYPRO) 600 MG tablet Take 1 tablet by mouth 2 (two) times daily.    . phenytoin (DILANTIN) 100 MG ER capsule Take 1 tablet by mouth 4 (four) times daily. For 5 days a week    . phenytoin (DILANTIN) 100 MG ER capsule Take 100 mg by mouth 3 (three) times daily. For  2 days a week.     No current facility-administered medications for this visit.    REVIEW OF SYSTEMS:   Constitutional: Denies fevers, chills or abnormal night sweats Eyes: Denies blurriness of vision, double vision or watery eyes Ears, nose, mouth, throat, and face: Denies mucositis or sore throat Respiratory: Denies cough, dyspnea or wheezes Cardiovascular: Denies palpitation, chest discomfort or lower extremity swelling Gastrointestinal:  Denies nausea, heartburn or change in bowel  habits Skin: Denies abnormal skin rashes Lymphatics: Denies new lymphadenopathy or easy bruising Neurological:Denies numbness, tingling or new weaknesses Behavioral/Psych: Mood is stable, no new changes  All other systems were reviewed with the patient and are negative.  PHYSICAL EXAMINATION: ECOG PERFORMANCE STATUS: 0 - Asymptomatic  BP 118/70 mmHg  Pulse 63  Temp(Src) 97.7 F (36.5 C) (Oral)  Resp 18  Ht _0  (1.626 m)  Wt 163 lb (73.936 kg)  BMI 27.97 kg/m2  SpO2 99% GENERAL:alert, no distress and comfortable SKIN: skin color, texture, turgor are normal, no rashes or significant lesions EYES: normal, conjunctiva are pink and non-injected, sclera clear OROPHARYNX:no exudate, no erythema and lips, buccal mucosa, and tongue normal  NECK: supple, thyroid normal size, non-tender, without nodularity LYMPH:  no palpable lymphadenopathy in the cervical, axillary or inguinal LUNGS: clear to auscultation and percussion with normal breathing effort HEART: regular rate &  rhythm and no murmurs and no lower extremity edema ABDOMEN:abdomen soft, non-tender and normal bowel sounds Musculoskeletal:no cyanosis of digits and no clubbing  PSYCH: alert & oriented x 3 with fluent speech NEURO: no focal motor/sensory deficits Breasts: Breast inspection showed them to be symmetrical with no nipple discharge. There is a 4X6.5cm cm mass in the 10 clock positionof her right breast, slightly tender. Palpation of the left breasts and axilla revealed no obvious mass that I could appreciate.   LABORATORY DATA:  I have reviewed the data as listed Lab Results  Component Value Date   WBC 6.1 07/18/2014   HGB 12.2 07/18/2014   HCT 36.4 07/18/2014   MCV 95.4 07/18/2014   PLT 218 07/18/2014    Recent Labs  06/18/14 0814 06/25/14 1355  NA 141 139  K 4.5 3.8  CL  --  103  CO2 28 26  GLUCOSE 74 176*  BUN 14.7 16  CREATININE 0.7 0.67  CALCIUM 8.9 9.0  GFRNONAA  --  88*  GFRAA  --  >90  PROT 6.9  6.6  ALBUMIN 3.4* 3.6  AST 17 22  ALT 19 17  ALKPHOS 105 98  BILITOT 0.21 0.3     PATHOLOGY REPORT 06/05/2014 Diagnosis Breast, right, needle core biopsy - INVASIVE MAMMARY CARCINOMA, SEE COMMENT. Microscopic Comment Although the grade of tumor is best assessed at resection, with these biopsies, the invasive tumor is grade III. The tumor expresses cytokeratin AE1/3 immunostain and is negative for S-100 and melanin A immunostains. Breast prognostic studies  Results: Estrogen Receptor: 0%, NEGATIVE Progesterone Receptor: 0%, NEGATIVE Proliferation Marker Ki67: 98% COMMENT: The negative hormone receptor study(ies) in this case have an internal positive control.  HER-2/NEU BY CISH - NEGATIVE. RESULT RATIO OF HER2: CEP 17 SIGNALS 1.31 AVERAGE HER2 COPY NUMBER PER CELL 2.10  Diagnosis Breast, left, needle core biopsy, UOQ - DUCTAL CARCINOMA IN-SITU. - SEE COMMENT. Microscopic Comment The ductal carcinoma in situ is low grade. The carcinoma cells are positive for e-cadherin and cytokeratin AE1/AE3. Smooth muscle myosin, calponin, and p63 stains highlight the presence of myoepithelial cells. Results: IMMUNOHISTOCHEMICAL AND MORPHOMETRIC ANALYSIS BY THE AUTOMATED CELLULAR IMAGING SYSTEM (ACIS) Estrogen Receptor: 100%, POSITIVE, STRONG STAINING INTENSITY Progesterone Receptor: 28%, POSITIVE, WEAK STAINING INTENSITY  Diagnosis 07/10/2014 Lymph node, needle/core biopsy, Left supraclavicular - BENIGN REACTIVE LYMPH NODE TISSUE. - NO METASTATIC CARCINOMA IDENTIFIED.  RADIOGRAPHIC STUDIES: I have personally reviewed the radiological images as listed and agreed with the findings in the report.  ASSESSMENT & PLAN:  69 year old postmenopausal woman, with past medical history of seizure and arthritis, presented with a palpable mass in the inner upper quadrant of her right breast. Biopsy revealed poorly differentiated carcinoma, triple negative.  1. Poorly differentiated mammary  carcinoma in her right breast, triple negative, with possible distant lymph nodes metastasis,  left breast DCIS, ER+/PR+ -I reviewed her PET scan findings and the left breast biopsy results with patient and her family member in great detail. -The PET scan showed hypermetabolic lymph nodes in the left supraclavicular, left cervical level II, right hilar and a periportal area. This are suspicious for distant note metastasis. -I discussed the [place] node biopsy which showed benign reactive lymph tissue -Her case was discussed in our breast tumor board, the consensus is to try biopsy of the right hilar node. This was discussed with thoracic tumor Board, and Dr. Lamonte Sakai will arrange the bronchoscopy/EBUS biopsy. I discussed with patient, she agrees to proceed. I'll coordinate with Dr. Lamonte Sakai, and we'll try to  get the biopsy done a few days before her next cycle chemotherapy in 2 weeks. -I have discussed with her surgeon Dr. Dalbert Batman, he would consider breast surgery if she has great response to neoadjuvant chemotherapy. -I recommended to start chemotherapy with dose dense Adriamycin and Cytoxan X4, followed by by weekly TaxolX6, to maximally control her breast cancer. -Chemotherapy consent: Side effects including but does not not limited to, fatigue, nausea, vomiting, diarrhea, hair loss, neuropathy, fluid retention, renal and kidney dysfunction, neutropenic fever, needed for blood transfusion, bleeding, heart failure were discussed with patient in great detail. She agrees to proceed. -I reviewed her echocardiogram, which showed normal EF 50-55% -Lab reviewed, adequate for treatment, we'll proceed first cycle AC today   Plan: -first cycle AC today -Right hilar lymph node biopsy by Dr. Lamonte Sakai before the next cycle chemotherapy. -RTC in 2 weeks before next cycle   All questions were answered. The patient knows to call the clinic with any problems, questions or concerns. I spent 30 minutes counseling the patient  face to face. The total time spent in the appointment was 40 minutes and more than 50% was on counseling.     Truitt Merle, MD 07/18/2014 8:37 AM

## 2014-07-18 NOTE — Telephone Encounter (Signed)
Pt will be getting back with me concerning her injection to be done at [facility] due to her home location... Lft msg at [facility] scheduling to contact pt concerning her inj apts.Marland Kitchen... KJ

## 2014-07-18 NOTE — Patient Instructions (Signed)
[facility] Discharge Instructions for Patients Receiving Chemotherapy  Today you received the following chemotherapy agents adriamycin/cytoxan  To help prevent nausea and vomiting after your treatment, we encourage you to take your nausea medication as directed   If you develop nausea and vomiting that is not controlled by your nausea medication, call the clinic.   BELOW ARE SYMPTOMS THAT SHOULD BE REPORTED IMMEDIATELY:  *FEVER GREATER THAN 100.5 F  *CHILLS WITH OR WITHOUT FEVER  NAUSEA AND VOMITING THAT IS NOT CONTROLLED WITH YOUR NAUSEA MEDICATION  *UNUSUAL SHORTNESS OF BREATH  *UNUSUAL BRUISING OR BLEEDING  TENDERNESS IN MOUTH AND THROAT WITH OR WITHOUT PRESENCE OF ULCERS  *URINARY PROBLEMS  *BOWEL PROBLEMS  UNUSUAL RASH Items with * indicate a potential emergency and should be followed up as soon as possible.  Feel free to call the clinic you have any questions or concerns. The clinic phone number is [phone].   Doxorubicin injection What is this medicine? DOXORUBICIN (dox oh ROO bi sin) is a chemotherapy drug. It is used to treat many kinds of cancer like Hodgkin's disease, leukemia, non-Hodgkin's lymphoma, neuroblastoma, sarcoma, and Wilms' tumor. It is also used to treat bladder cancer, breast cancer, lung cancer, ovarian cancer, stomach cancer, and thyroid cancer. This medicine may be used for other purposes; ask your health care provider or pharmacist if you have questions. COMMON BRAND NAME(S): Adriamycin, Adriamycin PFS, Adriamycin RDF, Rubex What should I tell my health care provider before I take this medicine? They need to know if you have any of these conditions: -blood disorders -heart disease, recent heart attack -infection (especially a virus infection such as chickenpox, cold sores, or herpes) -irregular heartbeat -liver disease -recent or ongoing radiation therapy -an unusual or allergic reaction to doxorubicin, other chemotherapy  agents, other medicines, foods, dyes, or preservatives -pregnant or trying to get pregnant -breast-feeding How should I use this medicine? This drug is given as an infusion into a vein. It is administered in a hospital or clinic by a specially trained health care professional. If you have pain, swelling, burning or any unusual feeling around the site of your injection, tell your health care professional right away. Talk to your pediatrician regarding the use of this medicine in children. Special care may be needed. Overdosage: If you think you have taken too much of this medicine contact a poison control center or emergency room at once. NOTE: This medicine is only for you. Do not share this medicine with others. What if I miss a dose? It is important not to miss your dose. Call your doctor or health care professional if you are unable to keep an appointment. What may interact with this medicine? Do not take this medicine with any of the following medications: -cisapride -droperidol -halofantrine -pimozide -zidovudine This medicine may also interact with the following medications: -chloroquine -chlorpromazine -clarithromycin -cyclophosphamide -cyclosporine -erythromycin -medicines for depression, anxiety, or psychotic disturbances -medicines for irregular heart beat like amiodarone, bepridil, dofetilide, encainide, flecainide, propafenone, quinidine -medicines for seizures like ethotoin, fosphenytoin, phenytoin -medicines for nausea, vomiting like dolasetron, ondansetron, palonosetron -medicines to increase blood counts like filgrastim, pegfilgrastim, sargramostim -methadone -methotrexate -pentamidine -progesterone -vaccines -verapamil Talk to your doctor or health care professional before taking any of these medicines: -acetaminophen -aspirin -ibuprofen -ketoprofen -naproxen This list may not describe all possible interactions. Give your health care provider a list of all  the medicines, herbs, non-prescription drugs, or dietary supplements you use. Also tell them if you smoke, drink alcohol, or use   illegal drugs. Some items may interact with your medicine. What should I watch for while using this medicine? Your condition will be monitored carefully while you are receiving this medicine. You will need important blood work done while you are taking this medicine. This drug may make you feel generally unwell. This is not uncommon, as chemotherapy can affect healthy cells as well as cancer cells. Report any side effects. Continue your course of treatment even though you feel ill unless your doctor tells you to stop. Your urine may turn red for a few days after your dose. This is not blood. If your urine is dark or brown, call your doctor. In some cases, you may be given additional medicines to help with side effects. Follow all directions for their use. Call your doctor or health care professional for advice if you get a fever, chills or sore throat, or other symptoms of a cold or flu. Do not treat yourself. This drug decreases your body's ability to fight infections. Try to avoid being around people who are sick. This medicine may increase your risk to bruise or bleed. Call your doctor or health care professional if you notice any unusual bleeding. Be careful brushing and flossing your teeth or using a toothpick because you may get an infection or bleed more easily. If you have any dental work done, tell your dentist you are receiving this medicine. Avoid taking products that contain aspirin, acetaminophen, ibuprofen, naproxen, or ketoprofen unless instructed by your doctor. These medicines may hide a fever. Men and women of childbearing age should use effective birth control methods while using taking this medicine. Do not become pregnant while taking this medicine. There is a potential for serious side effects to an unborn child. Talk to your health care professional or  pharmacist for more information. Do not breast-feed an infant while taking this medicine. Do not let others touch your urine or other body fluids for 5 days after each treatment with this medicine. Caregivers should wear latex gloves to avoid touching body fluids during this time. There is a maximum amount of this medicine you should receive throughout your life. The amount depends on the medical condition being treated and your overall health. Your doctor will watch how much of this medicine you receive in your lifetime. Tell your doctor if you have taken this medicine before. What side effects may I notice from receiving this medicine? Side effects that you should report to your doctor or health care professional as soon as possible: -allergic reactions like skin rash, itching or hives, swelling of the face, lips, or tongue -low blood counts - this medicine may decrease the number of white blood cells, red blood cells and platelets. You may be at increased risk for infections and bleeding. -signs of infection - fever or chills, cough, sore throat, pain or difficulty passing urine -signs of decreased platelets or bleeding - bruising, pinpoint red spots on the skin, black, tarry stools, blood in the urine -signs of decreased red blood cells - unusually weak or tired, fainting spells, lightheadedness -breathing problems -chest pain -fast, irregular heartbeat -mouth sores -nausea, vomiting -pain, swelling, redness at site where injected -pain, tingling, numbness in the hands or feet -swelling of ankles, feet, or hands -unusual bleeding or bruising Side effects that usually do not require medical attention (report to your doctor or health care professional if they continue or are bothersome): -diarrhea -facial flushing -hair loss -loss of appetite -missed menstrual periods -nail discoloration or damage -red   or watery eyes -red colored urine -stomach upset This list may not describe all  possible side effects. Call your doctor for medical advice about side effects. You may report side effects to FDA at 1-800-FDA-1088. Where should I keep my medicine? This drug is given in a hospital or clinic and will not be stored at home. NOTE: This sheet is a summary. It may not cover all possible information. If you have questions about this medicine, talk to your doctor, pharmacist, or health care provider.  2015, Elsevier/Gold Standard. (2012-07-10 09:54:34)  Cyclophosphamide injection What is this medicine? CYCLOPHOSPHAMIDE (sye kloe FOSS fa mide) is a chemotherapy drug. It slows the growth of cancer cells. This medicine is used to treat many types of cancer like lymphoma, myeloma, leukemia, breast cancer, and ovarian cancer, to name a few. This medicine may be used for other purposes; ask your health care provider or pharmacist if you have questions. COMMON BRAND NAME(S): Cytoxan, Neosar What should I tell my health care provider before I take this medicine? They need to know if you have any of these conditions: -blood disorders -history of other chemotherapy -infection -kidney disease -liver disease -recent or ongoing radiation therapy -tumors in the bone marrow -an unusual or allergic reaction to cyclophosphamide, other chemotherapy, other medicines, foods, dyes, or preservatives -pregnant or trying to get pregnant -breast-feeding How should I use this medicine? This drug is usually given as an injection into a vein or muscle or by infusion into a vein. It is administered in a hospital or clinic by a specially trained health care professional. Talk to your pediatrician regarding the use of this medicine in children. Special care may be needed. Overdosage: If you think you have taken too much of this medicine contact a poison control center or emergency room at once. NOTE: This medicine is only for you. Do not share this medicine with others. What if I miss a dose? It is  important not to miss your dose. Call your doctor or health care professional if you are unable to keep an appointment. What may interact with this medicine? This medicine may interact with the following medications: -amiodarone -amphotericin B -azathioprine -certain antiviral medicines for HIV or AIDS such as protease inhibitors (e.g., indinavir, ritonavir) and zidovudine -certain blood pressure medications such as benazepril, captopril, enalapril, fosinopril, lisinopril, moexipril, monopril, perindopril, quinapril, ramipril, trandolapril -certain cancer medications such as anthracyclines (e.g., daunorubicin, doxorubicin), busulfan, cytarabine, paclitaxel, pentostatin, tamoxifen, trastuzumab -certain diuretics such as chlorothiazide, chlorthalidone, hydrochlorothiazide, indapamide, metolazone -certain medicines that treat or prevent blood clots like warfarin -certain muscle relaxants such as succinylcholine -cyclosporine -etanercept -indomethacin -medicines to increase blood counts like filgrastim, pegfilgrastim, sargramostim -medicines used as general anesthesia -metronidazole -natalizumab This list may not describe all possible interactions. Give your health care provider a list of all the medicines, herbs, non-prescription drugs, or dietary supplements you use. Also tell them if you smoke, drink alcohol, or use illegal drugs. Some items may interact with your medicine. What should I watch for while using this medicine? Visit your doctor for checks on your progress. This drug may make you feel generally unwell. This is not uncommon, as chemotherapy can affect healthy cells as well as cancer cells. Report any side effects. Continue your course of treatment even though you feel ill unless your doctor tells you to stop. Drink water or other fluids as directed. Urinate often, even at night. In some cases, you may be given additional medicines to help with side effects. Follow all directions   for  their use. Call your doctor or health care professional for advice if you get a fever, chills or sore throat, or other symptoms of a cold or flu. Do not treat yourself. This drug decreases your body's ability to fight infections. Try to avoid being around people who are sick. This medicine may increase your risk to bruise or bleed. Call your doctor or health care professional if you notice any unusual bleeding. Be careful brushing and flossing your teeth or using a toothpick because you may get an infection or bleed more easily. If you have any dental work done, tell your dentist you are receiving this medicine. You may get drowsy or dizzy. Do not drive, use machinery, or do anything that needs mental alertness until you know how this medicine affects you. Do not become pregnant while taking this medicine or for 1 year after stopping it. Women should inform their doctor if they wish to become pregnant or think they might be pregnant. Men should not father a child while taking this medicine and for 4 months after stopping it. There is a potential for serious side effects to an unborn child. Talk to your health care professional or pharmacist for more information. Do not breast-feed an infant while taking this medicine. This medicine may interfere with the ability to have a child. This medicine has caused ovarian failure in some women. This medicine has caused reduced sperm counts in some men. You should talk with your doctor or health care professional if you are concerned about your fertility. If you are going to have surgery, tell your doctor or health care professional that you have taken this medicine. What side effects may I notice from receiving this medicine? Side effects that you should report to your doctor or health care professional as soon as possible: -allergic reactions like skin rash, itching or hives, swelling of the face, lips, or tongue -low blood counts - this medicine may decrease the  number of white blood cells, red blood cells and platelets. You may be at increased risk for infections and bleeding. -signs of infection - fever or chills, cough, sore throat, pain or difficulty passing urine -signs of decreased platelets or bleeding - bruising, pinpoint red spots on the skin, black, tarry stools, blood in the urine -signs of decreased red blood cells - unusually weak or tired, fainting spells, lightheadedness -breathing problems -dark urine -dizziness -palpitations -swelling of the ankles, feet, hands -trouble passing urine or change in the amount of urine -weight gain -yellowing of the eyes or skin Side effects that usually do not require medical attention (report to your doctor or health care professional if they continue or are bothersome): -changes in nail or skin color -hair loss -missed menstrual periods -mouth sores -nausea, vomiting This list may not describe all possible side effects. Call your doctor for medical advice about side effects. You may report side effects to FDA at 1-800-FDA-1088. Where should I keep my medicine? This drug is given in a hospital or clinic and will not be stored at home. NOTE: This sheet is a summary. It may not cover all possible information. If you have questions about this medicine, talk to your doctor, pharmacist, or health care provider.  2015, [employer]vier/Gold Standard. (2012-01-27 16:22:58)  

## 2014-07-18 NOTE — Telephone Encounter (Signed)
Dr. Lamonte Sakai,  I have spoken with her about the biopsy and she agrees with it. Please go ahead to schedule it. She started chemo today, could you possible to get this done a few days before her next cycle chemo on 5/6? That would be a safer window to do the biopsy. Please check her CBC the day before the procedure.  Thanks.  Truitt Merle  07/18/2014

## 2014-07-19 ENCOUNTER — Ambulatory Visit (HOSPITAL_BASED_OUTPATIENT_CLINIC_OR_DEPARTMENT_OTHER): Payer: Medicare Other

## 2014-07-19 ENCOUNTER — Encounter: Payer: Self-pay | Admitting: Hematology

## 2014-07-19 ENCOUNTER — Ambulatory Visit: Payer: Medicare Other

## 2014-07-19 VITALS — BP 98/54 | HR 63 | Temp 98.0°F | Resp 18

## 2014-07-19 DIAGNOSIS — C50911 Malignant neoplasm of unspecified site of right female breast: Secondary | ICD-10-CM | POA: Diagnosis present

## 2014-07-19 DIAGNOSIS — Z5189 Encounter for other specified aftercare: Secondary | ICD-10-CM

## 2014-07-19 DIAGNOSIS — C50211 Malignant neoplasm of upper-inner quadrant of right female breast: Secondary | ICD-10-CM

## 2014-07-19 MED ORDER — PEGFILGRASTIM INJECTION 6 MG/0.6ML ~~LOC~~
6.0000 mg | PREFILLED_SYRINGE | Freq: Once | SUBCUTANEOUS | Status: AC
  Administered 2014-07-19: 6 mg via SUBCUTANEOUS

## 2014-07-21 NOTE — Telephone Encounter (Signed)
I placed order today to schedule. Will do ASAP.

## 2014-07-22 ENCOUNTER — Telehealth: Payer: Self-pay | Admitting: *Deleted

## 2014-07-22 NOTE — Telephone Encounter (Signed)
Patient does not have any complaints. Instructed patient to call the clinic if she has any issues. Patient verbalized understanding.

## 2014-07-22 NOTE — Telephone Encounter (Signed)
-----   Message from Arty Baumgartner, RN sent at 07/18/2014 11:05 AM EDT ----- Regarding: First time chemo Burr Medico First time A/C  Dr Burr Medico.   [phone]

## 2014-07-23 ENCOUNTER — Encounter (HOSPITAL_COMMUNITY): Payer: Self-pay | Admitting: *Deleted

## 2014-07-27 ENCOUNTER — Encounter (HOSPITAL_COMMUNITY): Payer: Self-pay | Admitting: Anesthesiology

## 2014-07-27 NOTE — Anesthesia Preprocedure Evaluation (Signed)
Anesthesia Evaluation  Patient identified by MRN, date of birth, ID band Patient awake    Reviewed: Allergy & Precautions, NPO status , Patient's Chart, lab work & pertinent test results  Airway Mallampati: II  TM Distance: >3 FB Neck ROM: Full    Dental no notable dental hx.    Pulmonary former smoker,  breath sounds clear to auscultation  Pulmonary exam normal       Cardiovascular negative cardio ROS  Rhythm:Regular Rate:Normal     Neuro/Psych Seizures -, Well Controlled,  negative psych ROS   GI/Hepatic negative GI ROS, Neg liver ROS,   Endo/Other  negative endocrine ROS  Renal/GU negative Renal ROS  negative genitourinary   Musculoskeletal  (+) Arthritis -,   Abdominal   Peds negative pediatric ROS (+)  Hematology negative hematology ROS (+)   Anesthesia Other Findings   Reproductive/Obstetrics negative OB ROS                             Anesthesia Physical Anesthesia Plan  ASA: II  Anesthesia Plan: General   Post-op Pain Management:    Induction: Intravenous  Airway Management Planned: Oral ETT  Additional Equipment:   Intra-op Plan:   Post-operative Plan: Extubation in OR  Informed Consent: I have reviewed the patients History and Physical, chart, labs and discussed the procedure including the risks, benefits and alternatives for the proposed anesthesia with the patient or authorized representative who has indicated his/her understanding and acceptance.   Dental advisory given  Plan Discussed with: CRNA  Anesthesia Plan Comments:         Anesthesia Quick Evaluation

## 2014-07-28 ENCOUNTER — Ambulatory Visit (HOSPITAL_COMMUNITY): Payer: Medicare Other | Admitting: Anesthesiology

## 2014-07-28 ENCOUNTER — Ambulatory Visit (HOSPITAL_COMMUNITY)
Admission: RE | Admit: 2014-07-28 | Discharge: 2014-07-28 | Disposition: A | Discharge status: Home / Self Care | Payer: Medicare Other | Source: Ambulatory Visit | Attending: Emergency Medicine | Admitting: Emergency Medicine

## 2014-07-28 ENCOUNTER — Encounter (HOSPITAL_COMMUNITY)
Admission: RE | Disposition: A | Discharge status: Home / Self Care | Payer: Self-pay | Source: Ambulatory Visit | Attending: Emergency Medicine

## 2014-07-28 ENCOUNTER — Encounter (HOSPITAL_COMMUNITY): Payer: Self-pay | Admitting: Anesthesiology

## 2014-07-28 DIAGNOSIS — Z801 Family history of malignant neoplasm of trachea, bronchus and lung: Secondary | ICD-10-CM | POA: Insufficient documentation

## 2014-07-28 DIAGNOSIS — Z885 Allergy status to narcotic agent status: Secondary | ICD-10-CM | POA: Diagnosis not present

## 2014-07-28 DIAGNOSIS — Z800 Family history of malignant neoplasm of digestive organs: Secondary | ICD-10-CM | POA: Insufficient documentation

## 2014-07-28 DIAGNOSIS — Z7982 Long term (current) use of aspirin: Secondary | ICD-10-CM | POA: Diagnosis not present

## 2014-07-28 DIAGNOSIS — Z170 Estrogen receptor positive status [ER+]: Secondary | ICD-10-CM | POA: Insufficient documentation

## 2014-07-28 DIAGNOSIS — Z882 Allergy status to sulfonamides status: Secondary | ICD-10-CM | POA: Insufficient documentation

## 2014-07-28 DIAGNOSIS — D0512 Intraductal carcinoma in situ of left breast: Secondary | ICD-10-CM | POA: Diagnosis not present

## 2014-07-28 DIAGNOSIS — R918 Other nonspecific abnormal finding of lung field: Secondary | ICD-10-CM | POA: Diagnosis not present

## 2014-07-28 DIAGNOSIS — Z79891 Long term (current) use of opiate analgesic: Secondary | ICD-10-CM | POA: Diagnosis not present

## 2014-07-28 DIAGNOSIS — R599 Enlarged lymph nodes, unspecified: Secondary | ICD-10-CM | POA: Diagnosis not present

## 2014-07-28 DIAGNOSIS — Z79899 Other long term (current) drug therapy: Secondary | ICD-10-CM | POA: Diagnosis not present

## 2014-07-28 DIAGNOSIS — C50211 Malignant neoplasm of upper-inner quadrant of right female breast: Secondary | ICD-10-CM | POA: Insufficient documentation

## 2014-07-28 DIAGNOSIS — Z171 Estrogen receptor negative status [ER-]: Secondary | ICD-10-CM | POA: Diagnosis not present

## 2014-07-28 DIAGNOSIS — Z853 Personal history of malignant neoplasm of breast: Secondary | ICD-10-CM | POA: Diagnosis not present

## 2014-07-28 DIAGNOSIS — M1990 Unspecified osteoarthritis, unspecified site: Secondary | ICD-10-CM | POA: Insufficient documentation

## 2014-07-28 DIAGNOSIS — R590 Localized enlarged lymph nodes: Secondary | ICD-10-CM | POA: Diagnosis present

## 2014-07-28 DIAGNOSIS — Z87891 Personal history of nicotine dependence: Secondary | ICD-10-CM | POA: Diagnosis not present

## 2014-07-28 DIAGNOSIS — R569 Unspecified convulsions: Secondary | ICD-10-CM | POA: Insufficient documentation

## 2014-07-28 DIAGNOSIS — Z791 Long term (current) use of non-steroidal anti-inflammatories (NSAID): Secondary | ICD-10-CM | POA: Insufficient documentation

## 2014-07-28 HISTORY — PX: ENDOBRONCHIAL ULTRASOUND: SHX5096

## 2014-07-28 SURGERY — ENDOBRONCHIAL ULTRASOUND (EBUS)
Anesthesia: General | Laterality: Bilateral

## 2014-07-28 MED ORDER — ONDANSETRON HCL 4 MG/2ML IJ SOLN
INTRAMUSCULAR | Status: DC | PRN
Start: 2014-07-28 — End: 2014-07-28
  Administered 2014-07-28: 4 mg via INTRAVENOUS

## 2014-07-28 MED ORDER — FENTANYL CITRATE (PF) 100 MCG/2ML IJ SOLN: 25.0000 ug | INTRAMUSCULAR | Status: DC | PRN

## 2014-07-28 MED ORDER — PROPOFOL 10 MG/ML IV BOLUS
INTRAVENOUS | Status: AC
Start: 2014-07-28 — End: 2014-07-28
  Filled 2014-07-28: qty 20

## 2014-07-28 MED ORDER — SODIUM CHLORIDE 0.9 % IJ SOLN
INTRAMUSCULAR | Status: AC
  Filled 2014-07-28: qty 10

## 2014-07-28 MED ORDER — LIDOCAINE HCL (CARDIAC) 20 MG/ML IV SOLN
INTRAVENOUS | Status: AC
  Filled 2014-07-28: qty 5

## 2014-07-28 MED ORDER — FENTANYL CITRATE (PF) 100 MCG/2ML IJ SOLN
INTRAMUSCULAR | Status: AC
  Filled 2014-07-28: qty 2

## 2014-07-28 MED ORDER — PROPOFOL 10 MG/ML IV BOLUS
INTRAVENOUS | Status: AC
  Filled 2014-07-28: qty 20

## 2014-07-28 MED ORDER — PROPOFOL 10 MG/ML IV BOLUS
INTRAVENOUS | Status: DC | PRN
  Administered 2014-07-28: 150 mg via INTRAVENOUS

## 2014-07-28 MED ORDER — LACTATED RINGERS IV SOLN
INTRAVENOUS | Status: DC | PRN
  Administered 2014-07-28: 07:00:00 via INTRAVENOUS

## 2014-07-28 MED ORDER — EPHEDRINE SULFATE 50 MG/ML IJ SOLN
INTRAMUSCULAR | Status: AC
  Filled 2014-07-28: qty 1

## 2014-07-28 MED ORDER — LIDOCAINE HCL (CARDIAC) 20 MG/ML IV SOLN
INTRAVENOUS | Status: DC | PRN
  Administered 2014-07-28: 50 mg via INTRAVENOUS

## 2014-07-28 MED ORDER — FENTANYL CITRATE (PF) 100 MCG/2ML IJ SOLN
INTRAMUSCULAR | Status: DC | PRN
  Administered 2014-07-28 (×2): 25 ug via INTRAVENOUS
  Administered 2014-07-28: 50 ug via INTRAVENOUS

## 2014-07-28 MED ORDER — ONDANSETRON HCL 4 MG/2ML IJ SOLN
INTRAMUSCULAR | Status: AC
  Filled 2014-07-28: qty 2

## 2014-07-28 MED ORDER — EPHEDRINE SULFATE 50 MG/ML IJ SOLN
INTRAMUSCULAR | Status: AC
Start: 2014-07-28 — End: 2014-07-28
  Filled 2014-07-28: qty 1

## 2014-07-28 MED ORDER — SUCCINYLCHOLINE CHLORIDE 20 MG/ML IJ SOLN
INTRAMUSCULAR | Status: DC | PRN
  Administered 2014-07-28: 100 mg via INTRAVENOUS

## 2014-07-28 MED ORDER — DEXAMETHASONE SODIUM PHOSPHATE 10 MG/ML IJ SOLN
INTRAMUSCULAR | Status: DC | PRN
  Administered 2014-07-28: 10 mg via INTRAVENOUS

## 2014-07-28 MED ORDER — PROMETHAZINE HCL 25 MG/ML IJ SOLN: 6.2500 mg | INTRAMUSCULAR | Status: DC | PRN

## 2014-07-28 MED ORDER — DEXAMETHASONE SODIUM PHOSPHATE 10 MG/ML IJ SOLN
INTRAMUSCULAR | Status: AC
Start: 2014-07-28 — End: 2014-07-28
  Filled 2014-07-28: qty 1

## 2014-07-28 MED ORDER — EPHEDRINE SULFATE 50 MG/ML IJ SOLN
INTRAMUSCULAR | Status: DC | PRN
Start: 2014-07-28 — End: 2014-07-28
  Administered 2014-07-28: 5 mg via INTRAVENOUS

## 2014-07-28 MED ORDER — LACTATED RINGERS IV SOLN
INTRAVENOUS | Status: DC
  Administered 2014-07-28: 1000 mL via INTRAVENOUS

## 2014-07-28 NOTE — Anesthesia Procedure Notes (Signed)
Procedure Name: Intubation Date/Time: 07/28/2014 7:52 AM Performed by: Dallen Bunte, Virgel Gess Pre-anesthesia Checklist: Patient identified, Emergency Drugs available, Suction available, Patient being monitored and Timeout performed Patient Re-evaluated:Patient Re-evaluated prior to inductionOxygen Delivery Method: Circle system utilized Preoxygenation: Pre-oxygenation with 100% oxygen Intubation Type: IV induction Ventilation: Mask ventilation without difficulty Laryngoscope Size: Mac and 4 Grade View: Grade I Tube type: Oral Tube size: 9.0 mm Number of attempts: 1 Airway Equipment and Method: Stylet Placement Confirmation: ETT inserted through vocal cords under direct vision,  positive ETCO2,  CO2 detector and breath sounds checked- equal and bilateral Secured at: 22 cm Tube secured with: Tape Dental Injury: Teeth and Oropharynx as per pre-operative assessment

## 2014-07-28 NOTE — Anesthesia Postprocedure Evaluation (Signed)
  Anesthesia Post-op Note  Patient: Kristin Pennington  Procedure(s) Performed: Procedure(s) (LRB): ENDOBRONCHIAL ULTRASOUND (Bilateral)  Patient Location: PACU  Anesthesia Type: General  Level of Consciousness: awake and alert   Airway and Oxygen Therapy: Patient Spontanous Breathing  Post-op Pain: mild  Post-op Assessment: Post-op Vital signs reviewed, Patient's Cardiovascular Status Stable, Respiratory Function Stable, Patent Airway and No signs of Nausea or vomiting  Last Vitals:  Filed Vitals:   07/28/14 0914  BP:   Pulse: 72  Temp:   Resp: 14    Post-op Vital Signs: stable   Complications: No apparent anesthesia complications

## 2014-07-28 NOTE — Transfer of Care (Signed)
Immediate Anesthesia Transfer of Care Note  Patient: Kristin Pennington  Procedure(s) Performed: Procedure(s): ENDOBRONCHIAL ULTRASOUND (Bilateral)  Patient Location: PACU  Anesthesia Type:General  Level of Consciousness: awake, alert  and oriented  Airway & Oxygen Therapy: Patient Spontanous Breathing and Patient connected to face mask oxygen  Post-op Assessment: Report given to RN  Post vital signs: Reviewed and stable  Last Vitals:  Filed Vitals:   07/28/14 0705  BP: 112/65  Pulse: 73  Temp: 36.6 C  Resp: 14    Complications: No apparent anesthesia complications

## 2014-07-28 NOTE — Discharge Instructions (Signed)
Flexible Bronchoscopy, Care After °These instructions give you information on caring for yourself after your procedure. Your doctor may also give you more specific instructions. Call your doctor if you have any problems or questions after your procedure. °HOME CARE °· Do not eat or drink anything for 2 hours after your procedure. If you try to eat or drink before the medicine wears off, food or drink could go into your lungs. You could also burn yourself. °· After 2 hours have passed and when you can cough and gag normally, you may eat soft food and drink liquids slowly. °· The day after the test, you may eat your normal diet. °· You may do your normal activities. °· Keep all doctor visits. °GET HELP RIGHT AWAY IF: °· You get more and more short of breath. °· You get light-headed. °· You feel like you are going to pass out (faint). °· You have chest pain. °· You have new problems that worry you. °· You cough up more than a little blood. °· You cough up more blood than before. °MAKE SURE YOU: °· Understand these instructions. °· Will watch your condition. °· Will get help right away if you are not doing well or get worse. °Document Released: 01/09/2009 Document Revised: 03/19/2013 Document Reviewed: 11/16/2012 °[employer]® Patient Information ©2015 [employer]. This information is not intended to replace advice given to you by your health care provider. Make sure you discuss any questions you have with your health care provider. ° °Please call our office for any questions or concerns. [phone].  ° ° ° °

## 2014-07-28 NOTE — H&P (View-Only) (Signed)
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 Ultrasound of the right axilla is negative   06/05/2014 Pathology Results INVASIVE MAMMARY CARCINOMA   06/05/2014 Receptors her2 Estrogen Receptor: 0%, NEGATIVE Progesterone Receptor: 0%, NEGATIVE Proliferation Marker Ki67: 98%  HER-2/NEU BY CISH - NEGATIVE   06/11/2014 Initial Diagnosis Breast cancer of upper-inner quadrant of right female breast   07/02/2014 PET scan 1. Intensely hypermetabolic mass in the medial RIGHT chest wall with hypermetabolic right axillary node 2. Hypermetabolic nodal metastasis in the LEFT level 2 and LEFT supraclavicular and periportal node (SUV 5).    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 female is here because of recently diagnosed poorly differentiated carcinoma in her right breast.  She found a lump in her right breast 3 weeks ago, mild tenderness,  No other complains. Last mammo was in oct 2015 which was negative. She feels well overall. She denies any pain, cough, shortness breast, GI symptoms. She has good energy level and appetite, no recent weight loss.  Her last colonoscopy was for 5 years ago which was negative per patient. Her last Pap smear was 3 years ago which was negative per patient.  INTERIM HISTORY: [name] returns for follow-up and the first cycle chemotherapy. She tolerated the left side supraclavicular lymph node biopsy very well, no complications. She otherwise feels well, denies any new complaints.  MEDICAL HISTORY:  Past Medical History  Diagnosis Date  . Breast cancer   . Arthritis   . Wears glasses   . Wears partial dentures     top  . Seizures     last 2006     SURGICAL HISTORY: Past Surgical History  Procedure Laterality Date  . Appendectomy    . Cholecystectomy    . Abdominal hysterectomy    . Tonsillectomy    . Colonoscopy    . Portacath placement N/A 06/30/2014    Procedure: INSERTION PORT-A-CATH WITH ULTRA SOUND ON STANDBY;  Surgeon: Fanny Skates, MD;  Location: [facility];  Service: General;  Laterality: N/A;   SOCIAL HISTORY: History   Social History  . Marital Status: Married    Spouse Name: N/A  . Number of Children: N/A  . Years of Education:  N/A   Occupational History  . Not on file.   Social History Main Topics  . Smoking status: Former Smoker -- 1.00 packs/day for 18 years    Quit date: 03/28/1988  . Smokeless tobacco: Not on file  . Alcohol Use: No  . Drug Use: No  . Sexual Activity: Not on file   Other Topics Concern  . Not on file   Social History Narrative     GYN HISTORY  Menarchal: 11 LMP: 1994, hysrectomy  Contraceptive: no    HRT: since 31  G2P1, one miscarriage     FAMILY HISTORY: Family History  Problem Relation Age of Onset  . Cancer Father 87    gastric cancer   . Cancer Sister 32    lung cancer     ALLERGIES:  is allergic to aspirin; codeine; diclofenac; meloxicam; nitrofurantoin; other; pyridium; sulfa antibiotics; tetracyclines & related; and tape.  MEDICATIONS:  Current Outpatient Prescriptions  Medication Sig Dispense Refill  . aspirin EC 81 MG tablet Take 81 mg by mouth daily.     . calcium carbonate (OS-CAL) 600 MG TABS tablet Take 600 mg by mouth 2 (two) times daily with a meal.    . cyanocobalamin (,VITAMIN B-12,) 1000 MCG/ML injection Inject 1,000 mcg into the muscle every 30 (thirty) days.     Marland Kitchen HYDROcodone-acetaminophen (NORCO) 5-325 MG per tablet Take 1-2 tablets by mouth every 6 (six) hours as needed for moderate pain or severe pain. 30 tablet 0  . levETIRAcetam (KEPPRA) 500 MG tablet Take 1 tablet by mouth 2 (two) times daily.    Marland Kitchen lidocaine-prilocaine (EMLA) cream Apply 1 application topically as needed. Apply to portacath  1 hour to 1 1/2 hours prior to procedures as needed.   Cover with clear plastic. 30 g 1  . oxaprozin (DAYPRO) 600 MG tablet Take 1 tablet by mouth 2 (two) times daily.    . phenytoin (DILANTIN) 100 MG ER capsule Take 1 tablet by mouth 4 (four) times daily. For 5 days a week    . phenytoin (DILANTIN) 100 MG ER capsule Take 100 mg by mouth 3 (three) times daily. For  2 days a week.     No current facility-administered medications for this visit.    REVIEW OF SYSTEMS:   Constitutional: Denies fevers, chills or abnormal night sweats Eyes: Denies blurriness of vision, double vision or watery eyes Ears, nose, mouth, throat, and face: Denies mucositis or sore throat Respiratory: Denies cough, dyspnea or wheezes Cardiovascular: Denies palpitation, chest discomfort or lower extremity swelling Gastrointestinal:  Denies nausea, heartburn or change in bowel  habits Skin: Denies abnormal skin rashes Lymphatics: Denies new lymphadenopathy or easy bruising Neurological:Denies numbness, tingling or new weaknesses Behavioral/Psych: Mood is stable, no new changes  All other systems were reviewed with the patient and are negative.  PHYSICAL EXAMINATION: ECOG PERFORMANCE STATUS: 0 - Asymptomatic  BP 118/70 mmHg  Pulse 63  Temp(Src) 97.7 F (36.5 C) (Oral)  Resp 18  Ht _0  (1.626 m)  Wt 163 lb (73.936 kg)  BMI 27.97 kg/m2  SpO2 99% GENERAL:alert, no distress and comfortable SKIN: skin color, texture, turgor are normal, no rashes or significant lesions EYES: normal, conjunctiva are pink and non-injected, sclera clear OROPHARYNX:no exudate, no erythema and lips, buccal mucosa, and tongue normal  NECK: supple, thyroid normal size, non-tender, without nodularity LYMPH:  no palpable lymphadenopathy in the cervical, axillary or inguinal LUNGS: clear to auscultation and percussion with normal breathing effort HEART: regular rate &  rhythm and no murmurs and no lower extremity edema ABDOMEN:abdomen soft, non-tender and normal bowel sounds Musculoskeletal:no cyanosis of digits and no clubbing  PSYCH: alert & oriented x 3 with fluent speech NEURO: no focal motor/sensory deficits Breasts: Breast inspection showed them to be symmetrical with no nipple discharge. There is a 4X6.5cm cm mass in the 10 clock positionof her right breast, slightly tender. Palpation of the left breasts and axilla revealed no obvious mass that I could appreciate.   LABORATORY DATA:  I have reviewed the data as listed Lab Results  Component Value Date   WBC 6.1 07/18/2014   HGB 12.2 07/18/2014   HCT 36.4 07/18/2014   MCV 95.4 07/18/2014   PLT 218 07/18/2014    Recent Labs  06/18/14 0814 06/25/14 1355  NA 141 139  K 4.5 3.8  CL  --  103  CO2 28 26  GLUCOSE 74 176*  BUN 14.7 16  CREATININE 0.7 0.67  CALCIUM 8.9 9.0  GFRNONAA  --  88*  GFRAA  --  >90  PROT 6.9  6.6  ALBUMIN 3.4* 3.6  AST 17 22  ALT 19 17  ALKPHOS 105 98  BILITOT 0.21 0.3     PATHOLOGY REPORT 06/05/2014 Diagnosis Breast, right, needle core biopsy - INVASIVE MAMMARY CARCINOMA, SEE COMMENT. Microscopic Comment Although the grade of tumor is best assessed at resection, with these biopsies, the invasive tumor is grade III. The tumor expresses cytokeratin AE1/3 immunostain and is negative for S-100 and melanin A immunostains. Breast prognostic studies  Results: Estrogen Receptor: 0%, NEGATIVE Progesterone Receptor: 0%, NEGATIVE Proliferation Marker Ki67: 98% COMMENT: The negative hormone receptor study(ies) in this case have an internal positive control.  HER-2/NEU BY CISH - NEGATIVE. RESULT RATIO OF HER2: CEP 17 SIGNALS 1.31 AVERAGE HER2 COPY NUMBER PER CELL 2.10  Diagnosis Breast, left, needle core biopsy, UOQ - DUCTAL CARCINOMA IN-SITU. - SEE COMMENT. Microscopic Comment The ductal carcinoma in situ is low grade. The carcinoma cells are positive for e-cadherin and cytokeratin AE1/AE3. Smooth muscle myosin, calponin, and p63 stains highlight the presence of myoepithelial cells. Results: IMMUNOHISTOCHEMICAL AND MORPHOMETRIC ANALYSIS BY THE AUTOMATED CELLULAR IMAGING SYSTEM (ACIS) Estrogen Receptor: 100%, POSITIVE, STRONG STAINING INTENSITY Progesterone Receptor: 28%, POSITIVE, WEAK STAINING INTENSITY  Diagnosis 07/10/2014 Lymph node, needle/core biopsy, Left supraclavicular - BENIGN REACTIVE LYMPH NODE TISSUE. - NO METASTATIC CARCINOMA IDENTIFIED.  RADIOGRAPHIC STUDIES: I have personally reviewed the radiological images as listed and agreed with the findings in the report.  ASSESSMENT & PLAN:  69 year old postmenopausal woman, with past medical history of seizure and arthritis, presented with a palpable mass in the inner upper quadrant of her right breast. Biopsy revealed poorly differentiated carcinoma, triple negative.  1. Poorly differentiated mammary  carcinoma in her right breast, triple negative, with possible distant lymph nodes metastasis,  left breast DCIS, ER+/PR+ -I reviewed her PET scan findings and the left breast biopsy results with patient and her family member in great detail. -The PET scan showed hypermetabolic lymph nodes in the left supraclavicular, left cervical level II, right hilar and a periportal area. This are suspicious for distant note metastasis. -I discussed the [place] node biopsy which showed benign reactive lymph tissue -Her case was discussed in our breast tumor board, the consensus is to try biopsy of the right hilar node. This was discussed with thoracic tumor Board, and Dr. Lamonte Sakai will arrange the bronchoscopy/EBUS biopsy. I discussed with patient, she agrees to proceed. I'll coordinate with Dr. Lamonte Sakai, and we'll try to  get the biopsy done a few days before her next cycle chemotherapy in 2 weeks. -I have discussed with her surgeon Dr. Dalbert Batman, he would consider breast surgery if she has great response to neoadjuvant chemotherapy. -I recommended to start chemotherapy with dose dense Adriamycin and Cytoxan X4, followed by by weekly TaxolX6, to maximally control her breast cancer. -Chemotherapy consent: Side effects including but does not not limited to, fatigue, nausea, vomiting, diarrhea, hair loss, neuropathy, fluid retention, renal and kidney dysfunction, neutropenic fever, needed for blood transfusion, bleeding, heart failure were discussed with patient in great detail. She agrees to proceed. -I reviewed her echocardiogram, which showed normal EF 50-55% -Lab reviewed, adequate for treatment, we'll proceed first cycle AC today   Plan: -first cycle AC today -Right hilar lymph node biopsy by Dr. Lamonte Sakai before the next cycle chemotherapy. -RTC in 2 weeks before next cycle   All questions were answered. The patient knows to call the clinic with any problems, questions or concerns. I spent 30 minutes counseling the patient  face to face. The total time spent in the appointment was 40 minutes and more than 50% was on counseling.     Truitt Merle, MD 07/18/2014 8:37 AM

## 2014-07-28 NOTE — Op Note (Signed)
Video Bronchoscopy with Endobronchial Ultrasound Procedure Note  Date of Operation: 07/28/2014  Pre-op Diagnosis: B hilar LAD, hx of Breast CA  Post-op Diagnosis: Same  Surgeon: Baltazar Apo  Assistants: None  Anesthesia: General endotracheal anesthesia  Operation: Flexible video fiberoptic bronchoscopy with endobronchial ultrasound and biopsies.  Estimated Blood Loss: Minimal  Complications: None apparent  Indications and History: Kristin Pennington is a 69 y.o. female with A hx of breast CA undergoing treatment. PET scan from 4/6 identified distant LAD in the R axilla, L supraclavicular area, R > L hila. Recommendation was made to pursue hilar nodal bx's via endobronchial ultrasound. The risks, benefits, complications, treatment options and expected outcomes were discussed with the patient.  The possibilities of pneumothorax, pneumonia, reaction to medication, pulmonary aspiration, perforation of a viscus, bleeding, failure to diagnose a condition and creating a complication requiring transfusion or operation were discussed with the patient who freely signed the consent.    Description of Procedure: The patient was examined in the preoperative area and history and data from the preprocedure consultation were reviewed. It was deemed appropriate to proceed.  The patient was taken to [facility], identified as Nena Polio and the procedure verified as Flexible Video Fiberoptic Bronchoscopy.  A Time Out was held and the above information confirmed. After being taken to the operating room general anesthesia was initiated and the patient  was orally intubated. The video fiberoptic bronchoscope was introduced via the endotracheal tube and a general inspection was performed which showed normal airways throughout. The standard scope was then withdrawn and the endobronchial ultrasound was used to identify and characterize the peritracheal, hilar and bronchial lymph nodes.  EBUS showed normal appearance  of the pre-tracheal, station 4R nodes. The 4L node could not be seen. The station 7 node was small and appeared normal. There was enlargement of the 10R and 10 L nodes.  Using real-time ultrasound guidance Wang needle biopsies were take from Station 10R and 10L  nodes and were sent for cytology. The patient tolerated the procedure well without apparent complications. There was no significant blood loss. The bronchoscope was withdrawn. Anesthesia was reversed and the patient was taken to the PACU for recovery.   Samples: 1. Wang needle biopsies from 10R node 2. Wang needle biopsies from 10L node  Plans:  The patient will be discharged from the PACU to home when recovered from anesthesia. We will review the cytology results with the patient when they become available. Outpatient followup will be with Dr Pablo Ledger and Dr Burr Medico.    Baltazar Apo, MD, PhD 07/28/2014, 8:50 AM [facility] [phone] or if no answer [phone]

## 2014-07-28 NOTE — Interval H&P Note (Signed)
PCCM Interval Note  Pt with breast CA stage 2A, noted to have LAD on PET, specifically R hilar area. Is referred for nodal bx via EBUS.   She denies any significant changes or new events w exception of a suspected L posterior OP ulceration (? Due to her chemotherapy). No F/C. No dyspnea. No new meds  Filed Vitals:   07/28/14 0705  BP: 112/65  Pulse: 73  Temp: 97.8 F (36.6 C)  TempSrc: Oral  Resp: 14  Height: 5\' 4"  (1.626 m)  Weight: 73.029 kg (161 lb)   Gen: Pleasant, well-nourished, in no distress,  normal affect  ENT: No lesions,  mouth clear,  oropharynx clear, no postnasal drip, M3 airway  Neck: No JVD, no TMG, no carotid bruits  Lungs: No use of accessory muscles, no dullness to percussion, clear without rales or rhonchi  Cardiovascular: RRR, heart sounds normal, no murmur or gallops, trace LE peripheral edema  Musculoskeletal: No deformities, no cyanosis or clubbing  Neuro: alert, non focal  Skin: Warm, no lesions or rashes   Plans:  EBUS to facilitate B hilar nodal Wang needl;e bx's  Baltazar Apo, MD, PhD 07/28/2014, 7:34 AM [facility] [phone] or if no answer [phone]

## 2014-07-29 ENCOUNTER — Encounter (HOSPITAL_COMMUNITY): Payer: Self-pay | Admitting: Emergency Medicine

## 2014-07-30 ENCOUNTER — Telehealth: Payer: Self-pay | Admitting: Emergency Medicine

## 2014-07-30 NOTE — Telephone Encounter (Signed)
Reviewed results with the patient today. Shows normal lymphatic cells in all nodes sampled. I explained to her that there is some risk for a false negative, that we will need to discuss the future plans with Dr Burr Medico and Dr Pablo Ledger.

## 2014-08-01 ENCOUNTER — Telehealth: Payer: Self-pay | Admitting: Hematology

## 2014-08-01 ENCOUNTER — Other Ambulatory Visit (HOSPITAL_BASED_OUTPATIENT_CLINIC_OR_DEPARTMENT_OTHER): Payer: Medicare Other

## 2014-08-01 ENCOUNTER — Encounter: Payer: Self-pay | Admitting: *Deleted

## 2014-08-01 ENCOUNTER — Other Ambulatory Visit: Payer: Self-pay | Admitting: Medical Oncology

## 2014-08-01 ENCOUNTER — Ambulatory Visit (HOSPITAL_BASED_OUTPATIENT_CLINIC_OR_DEPARTMENT_OTHER): Payer: Medicare Other

## 2014-08-01 ENCOUNTER — Ambulatory Visit (HOSPITAL_BASED_OUTPATIENT_CLINIC_OR_DEPARTMENT_OTHER): Payer: Medicare Other | Admitting: Hematology

## 2014-08-01 VITALS — BP 116/71 | HR 69 | Temp 97.4°F | Resp 18 | Ht 64.0 in | Wt 161.3 lb

## 2014-08-01 DIAGNOSIS — D0512 Intraductal carcinoma in situ of left breast: Secondary | ICD-10-CM

## 2014-08-01 DIAGNOSIS — Z5111 Encounter for antineoplastic chemotherapy: Secondary | ICD-10-CM | POA: Diagnosis present

## 2014-08-01 DIAGNOSIS — C50211 Malignant neoplasm of upper-inner quadrant of right female breast: Secondary | ICD-10-CM

## 2014-08-01 DIAGNOSIS — R599 Enlarged lymph nodes, unspecified: Secondary | ICD-10-CM | POA: Diagnosis not present

## 2014-08-01 LAB — COMPREHENSIVE METABOLIC PANEL (CC13)
ALT: 11 U/L (ref 0–55)
AST: 13 U/L (ref 5–34)
Albumin: 3.5 g/dL (ref 3.5–5.0)
Alkaline Phosphatase: 141 U/L (ref 40–150)
Anion Gap: 12 mEq/L — ABNORMAL HIGH (ref 3–11)
BUN: 15.5 mg/dL (ref 7.0–26.0)
CO2: 27 mEq/L (ref 22–29)
Calcium: 9 mg/dL (ref 8.4–10.4)
Chloride: 106 mEq/L (ref 98–109)
Creatinine: 0.7 mg/dL (ref 0.6–1.1)
EGFR: 83 mL/min/{1.73_m2} — ABNORMAL LOW (ref >90)
Glucose: 97 mg/dl (ref 70–140)
Potassium: 4.3 mEq/L (ref 3.5–5.1)
Sodium: 144 mEq/L (ref 136–145)
Total Bilirubin: <0.2 mg/dL (ref 0.20–1.20)
Total Protein: 6.8 g/dL (ref 6.4–8.3)

## 2014-08-01 LAB — CBC WITH DIFFERENTIAL/PLATELET
BASO%: 0.6 % (ref 0.0–2.0)
Basophils Absolute: 0.1 10*3/uL (ref 0.0–0.1)
EOS%: 0.7 % (ref 0.0–7.0)
Eosinophils Absolute: 0.1 10*3/uL (ref 0.0–0.5)
HCT: 33.6 % — ABNORMAL LOW (ref 34.8–46.6)
HGB: 11.4 g/dL — ABNORMAL LOW (ref 11.6–15.9)
LYMPH%: 7.9 % — ABNORMAL LOW (ref 14.0–49.7)
MCH: 32.4 pg (ref 25.1–34.0)
MCHC: 33.8 g/dL (ref 31.5–36.0)
MCV: 95.6 fL (ref 79.5–101.0)
MONO#: 1.2 10*3/uL — ABNORMAL HIGH (ref 0.1–0.9)
MONO%: 5.9 % (ref 0.0–14.0)
NEUT#: 17.8 10*3/uL — ABNORMAL HIGH (ref 1.5–6.5)
NEUT%: 84.9 % — ABNORMAL HIGH (ref 38.4–76.8)
Platelets: 173 10*3/uL (ref 145–400)
RBC: 3.51 10*6/uL — ABNORMAL LOW (ref 3.70–5.45)
RDW: 13 % (ref 11.2–14.5)
WBC: 20.9 10*3/uL — ABNORMAL HIGH (ref 3.9–10.3)
lymph#: 1.6 10*3/uL (ref 0.9–3.3)

## 2014-08-01 MED ORDER — PEGFILGRASTIM 6 MG/0.6ML ~~LOC~~ PSKT: 6.0000 mg | PREFILLED_SYRINGE | Freq: Once | SUBCUTANEOUS | Status: AC

## 2014-08-01 MED ORDER — FOSAPREPITANT DIMEGLUMINE INJECTION 150 MG
Freq: Once | INTRAVENOUS | Status: AC
  Administered 2014-08-01: 10:00:00 via INTRAVENOUS
  Filled 2014-08-01: qty 5

## 2014-08-01 MED ORDER — SODIUM CHLORIDE 0.9 % IJ SOLN
10.0000 mL | INTRAMUSCULAR | Status: DC | PRN
  Administered 2014-08-01: 10 mL
  Filled 2014-08-01: qty 10

## 2014-08-01 MED ORDER — SODIUM CHLORIDE 0.9 % IV SOLN
600.0000 mg/m2 | Freq: Once | INTRAVENOUS | Status: AC
  Administered 2014-08-01: 1100 mg via INTRAVENOUS
  Filled 2014-08-01: qty 55

## 2014-08-01 MED ORDER — SODIUM CHLORIDE 0.9 % IV SOLN
Freq: Once | INTRAVENOUS | Status: AC
  Administered 2014-08-01: 10:00:00 via INTRAVENOUS

## 2014-08-01 MED ORDER — DOXORUBICIN HCL CHEMO IV INJECTION 2 MG/ML
60.0000 mg/m2 | Freq: Once | INTRAVENOUS | Status: AC
  Administered 2014-08-01: 110 mg via INTRAVENOUS
  Filled 2014-08-01: qty 55

## 2014-08-01 MED ORDER — HEPARIN SOD (PORK) LOCK FLUSH 100 UNIT/ML IV SOLN
500.0000 [IU] | Freq: Once | INTRAVENOUS | Status: AC | PRN
  Administered 2014-08-01: 500 [IU]
  Filled 2014-08-01: qty 5

## 2014-08-01 MED ORDER — PALONOSETRON HCL INJECTION 0.25 MG/5ML
INTRAVENOUS | Status: AC
  Filled 2014-08-01: qty 5

## 2014-08-01 MED ORDER — PALONOSETRON HCL INJECTION 0.25 MG/5ML
0.2500 mg | Freq: Once | INTRAVENOUS | Status: AC
  Administered 2014-08-01: 0.25 mg via INTRAVENOUS

## 2014-08-01 NOTE — Progress Notes (Signed)
Met with pt during cycle 2 chemotherapy treatment. Relate tolerated cycle 1 well. Denies needs or questions at this time. Encourage pt to call with concerns.

## 2014-08-01 NOTE — Progress Notes (Signed)
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 Ultrasound of the right axilla is negative   06/05/2014 Pathology Results INVASIVE MAMMARY CARCINOMA   06/05/2014 Receptors her2 Estrogen Receptor: 0%, NEGATIVE Progesterone Receptor: 0%, NEGATIVE Proliferation Marker Ki67: 98%  HER-2/NEU BY CISH - NEGATIVE   06/11/2014 Initial Diagnosis Breast cancer of upper-inner quadrant of right female breast   07/02/2014 PET scan 1. Intensely hypermetabolic mass in the medial RIGHT chest wall with hypermetabolic right axillary node 2. Hypermetabolic nodal metastasis in the LEFT level 2 and LEFT supraclavicular and periportal node (SUV 5).    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 female is here because of recently diagnosed poorly differentiated carcinoma in her right breast.  She found a lump in her right breast 3 weeks ago, mild tenderness,  No other complains. Last mammo was in oct 2015 which was negative. She feels well overall. She denies any pain, cough, shortness breast, GI symptoms. She has good energy level and appetite, no recent weight loss.  Her last colonoscopy was for 5 years ago which was negative per patient. Her last Pap smear was 3 years ago which was negative per patient.  INTERIM HISTORY: Kristin Pennington returns for follow-up and the second cycle chemotherapy. She tolerated the first cycle chemotherapy well, had a moderate fatigue and anorexia on day 3, but quickly recovered. No significant nausea, vomiting, diarrhea or other issues. She underwent bronchoscopy and right hilar lymph node biopsy by Dr. Lamonte Sakai last week and tolerated well.   MEDICAL HISTORY:  Past Medical History  Diagnosis Date  . Wears glasses   . Wears partial dentures     top  . 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 Arthritis     knee  . Breast cancer     bilateral, right breast mass, cancer both breast, x1 chemo  a week ago.     SURGICAL HISTORY: Past Surgical History  Procedure Laterality Date  . Appendectomy    . Cholecystectomy    . Abdominal hysterectomy    . Tonsillectomy    . Colonoscopy    . Portacath placement N/A 06/30/2014    Procedure: INSERTION PORT-A-CATH WITH ULTRA SOUND ON STANDBY;  Surgeon: Fanny Skates,  MD;  Location: [facility];  Service: General;  Laterality: N/A;  . Endobronchial ultrasound Bilateral 07/28/2014    Procedure: ENDOBRONCHIAL ULTRASOUND;  Surgeon: Collene Gobble, MD;  Location: [facility];  Service: Cardiopulmonary;  Laterality: Bilateral;   SOCIAL HISTORY: History   Social History  . Marital Status: Married    Spouse Name: N/A  . Number of Children: N/A  . Years of  Education: N/A   Occupational History  . Not on file.   Social History Main Topics  . Smoking status: Former Smoker -- 1.00 packs/day for 18 years    Quit date: 03/28/1988  . Smokeless tobacco: Not on file  . Alcohol Use: No  . Drug Use: No  . Sexual Activity: Not on file   Other Topics Concern  . Not on file   Social History Narrative     GYN HISTORY  Menarchal: 11 LMP: 1994, hysrectomy  Contraceptive: no  HRT: since 26  G2P1, one miscarriage     FAMILY HISTORY: Family History  Problem Relation Age of Onset  . Cancer Father 61    gastric cancer   . Cancer Sister 34    lung cancer     ALLERGIES:  is allergic to aspirin; codeine; diclofenac; meloxicam; nitrofurantoin; other; pyridium; sulfa antibiotics; tetracyclines & related; and tape.  MEDICATIONS:  Current Outpatient Prescriptions  Medication Sig Dispense Refill  . aspirin EC 81 MG tablet Take 81 mg by mouth daily.     . calcium carbonate (OS-CAL) 600 MG TABS tablet Take 600 mg by mouth 2 (two) times daily with a meal.    . cyanocobalamin (,VITAMIN B-12,) 1000 MCG/ML injection Inject 1,000 mcg into the muscle every 30 (thirty) days.     Marland Kitchen HYDROcodone-acetaminophen (NORCO) 5-325 MG per tablet Take 1-2 tablets by mouth every 6 (six) hours as needed for moderate pain or severe pain. 30 tablet 0  . levETIRAcetam (KEPPRA) 500 MG tablet Take 1 tablet by mouth 2 (two) times daily.    Marland Kitchen lidocaine-prilocaine (EMLA) cream Apply 1 application topically as needed. Apply to portacath  1 hour to 1 1/2 hours prior to procedures as needed.   Cover with clear plastic. 30 g 1  . ondansetron (ZOFRAN) 8 MG tablet Take 1 tablet (8 mg total) by mouth 2 (two) times daily as needed. Start on the third day after chemotherapy. 30 tablet 1  . oxaprozin (DAYPRO) 600 MG tablet Take 1 tablet by mouth 2 (two) times daily.    . phenytoin (DILANTIN) 100 MG ER capsule Take 1 tablet by mouth 4 (four) times daily. For 5 days a week    .  phenytoin (DILANTIN) 100 MG ER capsule Take 100-200 mg by mouth 3 (three) times daily. For  2 days a week. 2 in the  morning and 1 at night    . prochlorperazine (COMPAZINE) 10 MG tablet Take 1 tablet (10 mg total) by mouth every 6 (six) hours as needed (Nausea or vomiting). 30 tablet 1  . zolpidem (AMBIEN) 5 MG tablet Take 1 tablet (5 mg total) by mouth at bedtime as needed for sleep. 30 tablet 0   No current facility-administered medications for this visit.    REVIEW OF SYSTEMS:   Constitutional: Denies fevers, chills or abnormal night sweats Eyes: Denies blurriness of vision, double vision or watery eyes Ears, nose, mouth, throat, and face: Denies mucositis or sore throat Respiratory: Denies cough, dyspnea or wheezes Cardiovascular: Denies palpitation, chest discomfort or lower extremity swelling Gastrointestinal:  Denies nausea, heartburn or change in bowel habits Skin: Denies abnormal skin rashes Lymphatics: Denies new lymphadenopathy or easy bruising Neurological:Denies numbness, tingling or new weaknesses Behavioral/Psych: Mood is stable, no new changes  All other systems were reviewed with the patient and are negative.  PHYSICAL EXAMINATION: ECOG PERFORMANCE STATUS: 0 - Asymptomatic  BP 116/71 mmHg  Pulse 69  Temp(Src) 97.4 F (36.3 C) (Oral)  Resp 18  Ht _0  (1.626 m)  Wt 161 lb 4.8 oz (73.165 kg)  BMI 27.67 kg/m2  SpO2 99% GENERAL:alert, no distress and comfortable SKIN: skin color, texture, turgor are normal, no rashes or significant lesions EYES: normal, conjunctiva are pink and non-injected, sclera clear OROPHARYNX:no exudate, no erythema and lips, buccal mucosa, and tongue normal  NECK: supple, thyroid normal size, non-tender, without nodularity LYMPH:  no palpable lymphadenopathy in the cervical, axillary or inguinal LUNGS: clear to auscultation and percussion with normal breathing effort HEART: regular rate & rhythm and no murmurs and no lower extremity  edema ABDOMEN:abdomen soft, non-tender and normal bowel sounds Musculoskeletal:no cyanosis of digits and no clubbing  PSYCH: alert & oriented x 3 with fluent speech NEURO: no focal motor/sensory deficits Breasts: Breast inspection showed them to be symmetrical with no nipple discharge. There is a 4.5X6.5cm cm mass in the 10 clock positionof her right breast, slightly tender. Palpation of the left breasts and axilla revealed no obvious mass that I could appreciate.   LABORATORY DATA:  I have reviewed the data as listed Lab Results  Component Value Date   WBC 6.1 07/18/2014   HGB 12.2 07/18/2014   HCT 36.4 07/18/2014   MCV 95.4 07/18/2014   PLT 218 07/18/2014    Recent Labs  06/18/14 0814 06/25/14 1355 07/18/14 0803  NA 141 139 142  K 4.5 3.8 4.7  CL  --  103  --   CO2 _1 GLUCOSE 74 176* 83  BUN 14.7 16 19.4  CREATININE 0.7 0.67 0.7  CALCIUM 8.9 9.0 8.9  GFRNONAA  --  88*  --   GFRAA  --  >90  --   PROT 6.9 6.6 6.8  ALBUMIN 3.4* 3.6 3.5  AST _2 ALT _3 ALKPHOS 105 98 117  BILITOT 0.21 0.3 <0.20     PATHOLOGY REPORT 06/05/2014 Diagnosis Breast, right, needle core biopsy - INVASIVE MAMMARY CARCINOMA, SEE COMMENT. Microscopic Comment Although the grade of tumor is best assessed at resection, with these biopsies, the invasive tumor is grade III. The tumor expresses cytokeratin AE1/3 immunostain and is negative for S-100 and melanin A immunostains. Breast prognostic studies  Results: Estrogen Receptor: 0%, NEGATIVE Progesterone Receptor: 0%, NEGATIVE Proliferation Marker Ki67: 98% COMMENT: The negative hormone receptor study(ies) in this case have an internal positive control.  HER-2/NEU BY CISH - NEGATIVE. RESULT RATIO OF HER2: CEP 17 SIGNALS 1.31 AVERAGE HER2 COPY NUMBER PER CELL 2.10  Diagnosis Breast, left, needle core biopsy, UOQ - DUCTAL CARCINOMA IN-SITU. - SEE COMMENT. Microscopic Comment The ductal carcinoma in situ is low grade.  The carcinoma cells are positive for e-cadherin and cytokeratin AE1/AE3. Smooth muscle myosin, calponin, and p63 stains highlight the presence of myoepithelial cells. Results: IMMUNOHISTOCHEMICAL AND MORPHOMETRIC ANALYSIS BY THE AUTOMATED CELLULAR IMAGING SYSTEM (ACIS) Estrogen Receptor: 100%, POSITIVE, STRONG STAINING INTENSITY Progesterone Receptor: 28%, POSITIVE, WEAK STAINING INTENSITY  Diagnosis 07/10/2014 Lymph node, needle/core biopsy, Left supraclavicular - BENIGN REACTIVE LYMPH NODE TISSUE. - NO METASTATIC CARCINOMA IDENTIFIED.  Diagnosis 07/28/2014  FINE NEEDLE  ASPIRATION, ENDOSCOPIC, 10R LYMPH NODE(SPECIMEN 1 OF 3 COLLECTED (07/28/14): NO MALIGNANT CELLS IDENTIFIED. MIXED LYMPHOID POPULATION.  RADIOGRAPHIC STUDIES: I have personally reviewed the radiological images as listed and agreed with the findings in the report.  ASSESSMENT & PLAN:  69 year old postmenopausal woman, with past medical history of seizure and arthritis, presented with a palpable mass in the inner upper quadrant of her right breast. Biopsy revealed poorly differentiated carcinoma, triple negative.  1. 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 -The PET scan showed hypermetabolic lymph nodes in the left supraclavicular, left cervical level II, right hilar and a periportal area. Although they are suspicious for distant note metastasis, both left Lambert and right hilar node biopsy were negaative, so likely reactive adenopathy. -I discussed the Bronson node and right hilar node biopsy which showed benign reactive lymph tissue -we have discussed her case in our breast tumor board, the consensus is proceed with neoadjuvant chemotherapy, followed by surgery and radiation. -I recommended to start chemotherapy with dose dense Adriamycin and  Cytoxan X4, followed by by weekly TaxolX6, to maximally control her breast cancer. She agrees -I reviewed her echocardiogram, which showed normal EF 50-55% -Repeat PET scan after she finishes chemotherapy -She tolerated the first cycle chemotherapy well.  -Lab reviewed, adequate for treatment, we'll proceed second cycle AC today   Plan: -2nd cycle AC today -RTC in 2 weeks before next cycle   All questions were answered. The patient knows to call the clinic with any problems, questions or concerns. I spent 30 minutes counseling the patient face to face. The total time spent in the appointment was 40 minutes and more than 50% was on counseling.     Truitt Merle, MD 08/01/2014 8:42 AM

## 2014-08-01 NOTE — Progress Notes (Signed)
Per Dr. Burr Medico, no Neulasta with this cycle of chemo.  Written prescription order for pt to recheck lab on 08/11/14.  Explanations given to pt and husband.

## 2014-08-01 NOTE — Patient Instructions (Signed)
[facility] Discharge Instructions for Patients Receiving Chemotherapy  Today you received the following chemotherapy agents :  Adriamycin,  Cytoxan.  To help prevent nausea and vomiting after your treatment, we encourage you to take your nausea medication as prescribed.   Make sure you have lab draw on 08/11/14 at a closest medical facility, and have results faxed to our office - fax number as indicated on prescription form.   [phone].    If you develop nausea and vomiting that is not controlled by your nausea medication, call the clinic.   BELOW ARE SYMPTOMS THAT SHOULD BE REPORTED IMMEDIATELY:  *FEVER GREATER THAN 100.5 F  *CHILLS WITH OR WITHOUT FEVER  NAUSEA AND VOMITING THAT IS NOT CONTROLLED WITH YOUR NAUSEA MEDICATION  *UNUSUAL SHORTNESS OF BREATH  *UNUSUAL BRUISING OR BLEEDING  TENDERNESS IN MOUTH AND THROAT WITH OR WITHOUT PRESENCE OF ULCERS  *URINARY PROBLEMS  *BOWEL PROBLEMS  UNUSUAL RASH Items with * indicate a potential emergency and should be followed up as soon as possible.  Feel free to call the clinic you have any questions or concerns. The clinic phone number is [phone].  Please show the [facility] at check-in to the Emergency Department and triage nurse.

## 2014-08-01 NOTE — Telephone Encounter (Signed)
Gave and rpinted appt sched and avs fo rpt for May and June..added tx.

## 2014-08-01 NOTE — Telephone Encounter (Signed)
cx appt per pof...per orders pof pt aware °

## 2014-08-02 ENCOUNTER — Ambulatory Visit: Payer: Medicare Other

## 2014-08-02 ENCOUNTER — Encounter: Payer: Self-pay | Admitting: Hematology

## 2014-08-11 DIAGNOSIS — Z9049 Acquired absence of other specified parts of digestive tract: Secondary | ICD-10-CM | POA: Diagnosis not present

## 2014-08-11 DIAGNOSIS — Z90722 Acquired absence of ovaries, bilateral: Secondary | ICD-10-CM | POA: Diagnosis not present

## 2014-08-11 DIAGNOSIS — Z90710 Acquired absence of both cervix and uterus: Secondary | ICD-10-CM | POA: Diagnosis not present

## 2014-08-11 DIAGNOSIS — C50211 Malignant neoplasm of upper-inner quadrant of right female breast: Secondary | ICD-10-CM | POA: Diagnosis not present

## 2014-08-11 DIAGNOSIS — Z8669 Personal history of other diseases of the nervous system and sense organs: Secondary | ICD-10-CM | POA: Diagnosis not present

## 2014-08-11 DIAGNOSIS — R7989 Other specified abnormal findings of blood chemistry: Secondary | ICD-10-CM | POA: Diagnosis not present

## 2014-08-11 DIAGNOSIS — Z9079 Acquired absence of other genital organ(s): Secondary | ICD-10-CM | POA: Diagnosis not present

## 2014-08-12 ENCOUNTER — Telehealth: Payer: Self-pay | Admitting: *Deleted

## 2014-08-12 NOTE — Telephone Encounter (Signed)
Called pt with results of labs done at [facility] showing WBC 0.54.  Informed pt of neutropenic precautions & instructed to call no matter what if any signs or symptoms of infection & especially if she runs fever 100.5 or >.  Pt expressed understanding & will be back in office 08/15/14 for lab/MD/RX.

## 2014-08-15 ENCOUNTER — Ambulatory Visit: Payer: Medicare Other

## 2014-08-15 ENCOUNTER — Other Ambulatory Visit (HOSPITAL_BASED_OUTPATIENT_CLINIC_OR_DEPARTMENT_OTHER): Payer: Medicare Other

## 2014-08-15 ENCOUNTER — Ambulatory Visit (HOSPITAL_BASED_OUTPATIENT_CLINIC_OR_DEPARTMENT_OTHER): Payer: Medicare Other | Admitting: Hematology

## 2014-08-15 ENCOUNTER — Encounter: Payer: Self-pay | Admitting: Hematology

## 2014-08-15 ENCOUNTER — Telehealth: Payer: Self-pay | Admitting: Hematology

## 2014-08-15 VITALS — BP 109/70 | HR 73 | Temp 98.0°F | Resp 18 | Ht 64.0 in | Wt 163.5 lb

## 2014-08-15 DIAGNOSIS — Z170 Estrogen receptor positive status [ER+]: Secondary | ICD-10-CM | POA: Diagnosis not present

## 2014-08-15 DIAGNOSIS — Z5189 Encounter for other specified aftercare: Secondary | ICD-10-CM | POA: Diagnosis not present

## 2014-08-15 DIAGNOSIS — C50211 Malignant neoplasm of upper-inner quadrant of right female breast: Secondary | ICD-10-CM | POA: Diagnosis not present

## 2014-08-15 DIAGNOSIS — D0512 Intraductal carcinoma in situ of left breast: Secondary | ICD-10-CM | POA: Diagnosis not present

## 2014-08-15 LAB — CBC WITH DIFFERENTIAL/PLATELET
BASO%: 3.4 % — ABNORMAL HIGH (ref 0.0–2.0)
Basophils Absolute: 0 10*3/uL (ref 0.0–0.1)
EOS%: 3.4 % (ref 0.0–7.0)
Eosinophils Absolute: 0 10*3/uL (ref 0.0–0.5)
HCT: 28.8 % — ABNORMAL LOW (ref 34.8–46.6)
HGB: 9.7 g/dL — ABNORMAL LOW (ref 11.6–15.9)
LYMPH%: 42.7 % (ref 14.0–49.7)
MCH: 32.1 pg (ref 25.1–34.0)
MCHC: 33.7 g/dL (ref 31.5–36.0)
MCV: 95.4 fL (ref 79.5–101.0)
MONO#: 0.4 10*3/uL (ref 0.1–0.9)
MONO%: 41.6 % — ABNORMAL HIGH (ref 0.0–14.0)
NEUT#: 0.1 10*3/uL — CL (ref 1.5–6.5)
NEUT%: 8.9 % — ABNORMAL LOW (ref 38.4–76.8)
Platelets: 210 10*3/uL (ref 145–400)
RBC: 3.02 10*6/uL — ABNORMAL LOW (ref 3.70–5.45)
RDW: 14 % (ref 11.2–14.5)
WBC: 0.9 10*3/uL — CL (ref 3.9–10.3)
lymph#: 0.4 10*3/uL — ABNORMAL LOW (ref 0.9–3.3)
nRBC: 0 % (ref 0–0)

## 2014-08-15 LAB — COMPREHENSIVE METABOLIC PANEL (CC13)
ALT: 11 U/L (ref 0–55)
AST: 15 U/L (ref 5–34)
Albumin: 3.3 g/dL — ABNORMAL LOW (ref 3.5–5.0)
Alkaline Phosphatase: 108 U/L (ref 40–150)
Anion Gap: 11 mEq/L (ref 3–11)
BUN: 13.2 mg/dL (ref 7.0–26.0)
CO2: 26 mEq/L (ref 22–29)
Calcium: 8.4 mg/dL (ref 8.4–10.4)
Chloride: 107 mEq/L (ref 98–109)
Creatinine: 0.7 mg/dL (ref 0.6–1.1)
EGFR: 89 mL/min/{1.73_m2} — ABNORMAL LOW (ref >90)
Glucose: 84 mg/dl (ref 70–140)
Potassium: 4.6 mEq/L (ref 3.5–5.1)
Sodium: 143 mEq/L (ref 136–145)
Total Bilirubin: <0.2 mg/dL (ref 0.20–1.20)
Total Protein: 6.4 g/dL (ref 6.4–8.3)

## 2014-08-15 MED ORDER — TBO-FILGRASTIM 300 MCG/0.5ML ~~LOC~~ SOSY
300.0000 ug | PREFILLED_SYRINGE | Freq: Every day | SUBCUTANEOUS | Status: DC
  Administered 2014-08-15: 300 ug via SUBCUTANEOUS
  Filled 2014-08-15: qty 0.5

## 2014-08-15 NOTE — Telephone Encounter (Signed)
Gave and printed appt sched and avs for pt for May and JUNE °

## 2014-08-15 NOTE — Progress Notes (Signed)
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 Ultrasound of the right axilla is negative   06/05/2014 Pathology Results INVASIVE MAMMARY CARCINOMA   06/05/2014 Receptors her2 Estrogen Receptor: 0%, NEGATIVE Progesterone Receptor: 0%, NEGATIVE Proliferation Marker Ki67: 98%  HER-2/NEU BY CISH - NEGATIVE   06/11/2014 Initial Diagnosis Breast cancer of upper-inner quadrant of right female breast   07/02/2014 PET scan 1. Intensely hypermetabolic mass in the medial RIGHT chest wall with hypermetabolic right axillary node 2. Hypermetabolic nodal metastasis in the LEFT level 2 and LEFT supraclavicular and periportal node (SUV 5).    07/04/2014 Pathology Results Left breast mass biopsy showed DCIS, ER100%, PR 48%   07/10/2014 Pathology Results left [place] node biopsy was negative for malignant cells, benign reactive lymph tissue    07/18/2014 -   Chemotherapy Dose dense Adriamycin and Cytoxan, every 2 weeks, X4, followed by weekly Taxol for 12 weeks.   07/28/2014 Pathology Results Right hilar and are lymph node biopsy showed no malignant cells, mixed lymphoid population.   08/11/2014 Miscellaneous nadir CBC: WBC 0.54, hemoglobin 8.9, hematocrit 26%, platelet 125    HISTORY OF PRESENTING ILLNESS:  Kristin Pennington 69 y.o. female is here because of recently diagnosed poorly differentiated carcinoma in her right breast.  She found a lump in her right breast 3 weeks ago, mild tenderness,  No other complains. Last mammo was in oct 2015 which was negative. She feels well overall. She denies any pain, cough, shortness breast, GI symptoms. She has good energy level and appetite, no recent weight loss.  Her last colonoscopy was for 5 years ago which was negative per patient. Her last Pap smear was 3 years ago which was negative per patient.  INTERIM HISTORY: Kristin Pennington returns for follow-up and third cycle chemo. She tolerated the prior cycle chemo very well, had a mild fatigue for a day, no significant nausea, vomiting, change of appetite or other complaints. She did not get Neulasta after the prior cycle chemotherapy due to the high white count, and nadir WBC was 0.54 earlier this week, no fever or chills, no cough or GI symptoms. She feels well overall.  MEDICAL HISTORY:  Past Medical History  Diagnosis Date  . Wears glasses   . Wears partial dentures     top  . 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 Arthritis     knee  . Breast cancer     bilateral, right  breast mass, cancer both breast, x1 chemo  a week ago.     SURGICAL HISTORY: Past Surgical History  Procedure Laterality Date  . Appendectomy    . Cholecystectomy    . Abdominal hysterectomy    . Tonsillectomy    . Colonoscopy    . Portacath placement N/A 06/30/2014    Procedure: INSERTION PORT-A-CATH WITH ULTRA SOUND ON STANDBY;  Surgeon: Fanny Skates, MD;  Location: [facility];  Service: General;  Laterality: N/A;  . Endobronchial ultrasound Bilateral 07/28/2014    Procedure: ENDOBRONCHIAL ULTRASOUND;  Surgeon: Collene Gobble, MD;  Location: [facility];  Service: Cardiopulmonary;  Laterality: Bilateral;   SOCIAL HISTORY: History   Social History  . Marital Status: Married    Spouse Name: N/A  . Number of Children: N/A  . Years of Education: N/A   Occupational History  . Not on file.   Social History Main Topics  . Smoking status: Former Smoker -- 1.00 packs/day for 18 years    Quit date: 03/28/1988  . Smokeless tobacco: Not on file  . Alcohol Use: No  . Drug Use: No  . Sexual Activity: Not on file   Other Topics Concern  . Not on file   Social History Narrative     GYN HISTORY  Menarchal: 11 LMP: 1994, hysrectomy  Contraceptive: no  HRT: since 84  G2P1, one miscarriage     FAMILY HISTORY: Family History  Problem Relation Age of Onset  . Cancer Father 78    gastric cancer   . Cancer Sister 46    lung cancer     ALLERGIES:  is allergic to aspirin; codeine; diclofenac; meloxicam; nitrofurantoin; other; pyridium; sulfa antibiotics; tetracyclines & related; and tape.  MEDICATIONS:  Current Outpatient Prescriptions  Medication Sig Dispense Refill  . aspirin EC 81 MG tablet Take 81 mg by mouth daily.     . calcium carbonate (OS-CAL) 600 MG TABS tablet Take 600 mg by mouth 2 (two) times daily with a meal.    . cyanocobalamin (,VITAMIN B-12,) 1000 MCG/ML injection Inject 1,000 mcg into the muscle every 30 (thirty) days.     Marland Kitchen HYDROcodone-acetaminophen (NORCO) 5-325 MG per tablet Take 1-2 tablets by mouth every 6 (six) hours as needed for moderate pain or severe pain. 30 tablet 0  . levETIRAcetam (KEPPRA) 500 MG tablet Take 1 tablet by mouth 2 (two) times daily.    Marland Kitchen lidocaine-prilocaine (EMLA) cream Apply 1 application topically as needed. Apply to portacath  1 hour to 1 1/2 hours prior to procedures as needed.   Cover  with clear plastic. 30 g 1  . ondansetron (ZOFRAN) 8 MG tablet Take 1 tablet (8 mg total) by mouth 2 (two) times daily as needed. Start on the third day after chemotherapy. 30 tablet 1  . oxaprozin (DAYPRO) 600 MG tablet Take 1 tablet by mouth 2 (two) times daily.    . phenytoin (DILANTIN) 100 MG ER capsule Take 1 tablet by mouth 4 (four) times daily. On  Sat , Sun, Tues, Wed, and  Thursdays    -  Take   116m  QID.    .Marland Kitchenphenytoin (DILANTIN) 100 MG ER capsule Take 100 mg by mouth 3 (three) times daily. Take 100 mg  TID  On  Mon  And   Fri.    . prochlorperazine (COMPAZINE) 10 MG tablet Take 1 tablet (10 mg total) by mouth every 6 (six) hours as needed (Nausea or vomiting). 30 tablet 1  .  zolpidem (AMBIEN) 5 MG tablet Take 1 tablet (5 mg total) by mouth at bedtime as needed for sleep. 30 tablet 0   No current facility-administered medications for this visit.    REVIEW OF SYSTEMS:   Constitutional: Denies fevers, chills or abnormal night sweats Eyes: Denies blurriness of vision, double vision or watery eyes Ears, nose, mouth, throat, and face: Denies mucositis or sore throat Respiratory: Denies cough, dyspnea or wheezes Cardiovascular: Denies palpitation, chest discomfort or lower extremity swelling Gastrointestinal:  Denies nausea, heartburn or change in bowel habits Skin: Denies abnormal skin rashes Lymphatics: Denies new lymphadenopathy or easy bruising Neurological:Denies numbness, tingling or new weaknesses Behavioral/Psych: Mood is stable, no new changes  All other systems were reviewed with the patient and are negative.  PHYSICAL EXAMINATION: ECOG PERFORMANCE STATUS: 0 - Asymptomatic  BP 109/70 mmHg  Pulse 73  Temp(Src) 98 F (36.7 C) (Oral)  Resp 18  Ht '5\' 4"'  (1.626 m)  Wt 163 lb 8 oz (74.163 kg)  BMI 28.05 kg/m2  SpO2 100% GENERAL:alert, no distress and comfortable SKIN: skin color, texture, turgor are normal, no rashes or significant lesions EYES: normal, conjunctiva are  pink and non-injected, sclera clear OROPHARYNX:no exudate, no erythema and lips, buccal mucosa, and tongue normal  NECK: supple, thyroid normal size, non-tender, without nodularity LYMPH:  no palpable lymphadenopathy in the cervical, axillary or inguinal LUNGS: clear to auscultation and percussion with normal breathing effort HEART: regular rate & rhythm and no murmurs and no lower extremity edema ABDOMEN:abdomen soft, non-tender and normal bowel sounds Musculoskeletal:no cyanosis of digits and no clubbing  PSYCH: alert & oriented x 3 with fluent speech NEURO: no focal motor/sensory deficits Breasts: Breast inspection showed them to be symmetrical with no nipple discharge. There is a 4.5X6.0cm cm mass in the 10 clock positionof her right breast, slightly tender. Palpation of the left breasts and axilla revealed no obvious mass that I could appreciate.   LABORATORY DATA:  I have reviewed the data as listed CBC Latest Ref Rng 08/15/2014 08/01/2014 07/18/2014  WBC 3.9 - 10.3 10e3/uL 0.9(LL) 20.9(H) 6.1  Hemoglobin 11.6 - 15.9 g/dL 9.7(L) 11.4(L) 12.2  Hematocrit 34.8 - 46.6 % 28.8(L) 33.6(L) 36.4  Platelets 145 - 400 10e3/uL 210 173 218    CMP Latest Ref Rng 08/15/2014 08/01/2014 07/18/2014  Glucose 70 - 140 mg/dl 84 97 83  BUN 7.0 - 26.0 mg/dL 13.2 15.5 19.4  Creatinine 0.6 - 1.1 mg/dL 0.7 0.7 0.7  Sodium 136 - 145 mEq/L 143 144 142  Potassium 3.5 - 5.1 mEq/L 4.6 4.3 4.7  Chloride 96 - 112 mmol/L - - -  CO2 22 - 29 mEq/L '26 27 26  ' Calcium 8.4 - 10.4 mg/dL 8.4 9.0 8.9  Total Protein 6.4 - 8.3 g/dL 6.4 6.8 6.8  Total Bilirubin 0.20 - 1.20 mg/dL <0.20 <0.20 <0.20  Alkaline Phos 40 - 150 U/L 108 141 117  AST 5 - 34 U/L '15 13 17  ' ALT 0 - 55 U/L '11 11 13      ' PATHOLOGY REPORT 06/05/2014 Diagnosis Breast, right, needle core biopsy - INVASIVE MAMMARY CARCINOMA, SEE COMMENT. Microscopic Comment Although the grade of tumor is best assessed at resection, with these biopsies, the invasive tumor  is grade III. The tumor expresses cytokeratin AE1/3 immunostain and is negative for S-100 and melanin A immunostains. Breast prognostic studies  Results: Estrogen Receptor: 0%, NEGATIVE Progesterone Receptor: 0%, NEGATIVE Proliferation Marker Ki67: 98% COMMENT: The negative hormone receptor study(ies) in this case have an  internal positive control.  HER-2/NEU BY CISH - NEGATIVE. RESULT RATIO OF HER2: CEP 17 SIGNALS 1.31 AVERAGE HER2 COPY NUMBER PER CELL 2.10  Diagnosis Breast, left, needle core biopsy, UOQ - DUCTAL CARCINOMA IN-SITU. - SEE COMMENT. Microscopic Comment The ductal carcinoma in situ is low grade. The carcinoma cells are positive for e-cadherin and cytokeratin AE1/AE3. Smooth muscle myosin, calponin, and p63 stains highlight the presence of myoepithelial cells. Results: IMMUNOHISTOCHEMICAL AND MORPHOMETRIC ANALYSIS BY THE AUTOMATED CELLULAR IMAGING SYSTEM (ACIS) Estrogen Receptor: 100%, POSITIVE, STRONG STAINING INTENSITY Progesterone Receptor: 28%, POSITIVE, WEAK STAINING INTENSITY  Diagnosis 07/10/2014 Lymph node, needle/core biopsy, Left supraclavicular - BENIGN REACTIVE LYMPH NODE TISSUE. - NO METASTATIC CARCINOMA IDENTIFIED.  Diagnosis 07/28/2014  FINE NEEDLE ASPIRATION, ENDOSCOPIC, 10R LYMPH NODE(SPECIMEN 1 OF 3 COLLECTED (07/28/14): NO MALIGNANT CELLS IDENTIFIED. MIXED LYMPHOID POPULATION.  RADIOGRAPHIC STUDIES: I have personally reviewed the radiological images as listed and agreed with the findings in the report.  ASSESSMENT & PLAN:  69 year old postmenopausal woman, with past medical history of seizure and arthritis, presented with a palpable mass in the inner upper quadrant of her right breast. Biopsy revealed poorly differentiated carcinoma, triple negative.  1. 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 -The PET scan showed hypermetabolic lymph nodes in the left supraclavicular, left cervical level II, right hilar and a periportal area. Although they are suspicious for distant note metastasis, both left [place] and right hilar node biopsy were negaative, so likely reactive adenopathy. -I discussed the [place] node and right hilar node biopsy which showed benign reactive lymph tissue -we have discussed her case in our breast tumor board, the consensus is proceed with neoadjuvant chemotherapy, followed by surgery and radiation. -I recommended to start chemotherapy with dose dense Adriamycin and Cytoxan X4, followed by by biweekly TaxolX6, to maximally control her breast cancer. She agrees -I reviewed her echocardiogram, which showed normal EF 50-55% -Repeat PET scan after she finishes chemotherapy -She tolerated the first two cycles chemotherapy well.  -This is a neutropenia after second cycle chemotherapy, we'll hold chemotherapy today. Granix will be given Today and tomorrow. -Postpone her chemotherapy to next Friday. -Neutropenic fever precaution reviewed   Plan: -Hold chemotherapy today, reschedule to next Friday, with Neulasta on day 2 -Granix today and tomorrow -CBC at local facility on May 25 -I'll see her back in 3 weeks  All questions were answered. The patient knows to call the clinic with any problems, questions or concerns. I spent 20 minutes counseling the patient face to face. The total time spent in the appointment was 30 minutes and more than 50% was on counseling.     Truitt Merle, MD 08/15/2014 9:11 AM

## 2014-08-16 ENCOUNTER — Ambulatory Visit (HOSPITAL_BASED_OUTPATIENT_CLINIC_OR_DEPARTMENT_OTHER): Payer: Medicare Other

## 2014-08-16 ENCOUNTER — Encounter: Payer: Self-pay | Admitting: Hematology

## 2014-08-16 VITALS — BP 116/71 | HR 75 | Temp 98.0°F | Resp 18

## 2014-08-16 DIAGNOSIS — Z5189 Encounter for other specified aftercare: Secondary | ICD-10-CM

## 2014-08-16 DIAGNOSIS — C50211 Malignant neoplasm of upper-inner quadrant of right female breast: Secondary | ICD-10-CM

## 2014-08-16 MED ORDER — TBO-FILGRASTIM 300 MCG/0.5ML ~~LOC~~ SOSY
300.0000 ug | PREFILLED_SYRINGE | Freq: Once | SUBCUTANEOUS | Status: AC
  Administered 2014-08-16: 300 ug via SUBCUTANEOUS

## 2014-08-20 DIAGNOSIS — C50211 Malignant neoplasm of upper-inner quadrant of right female breast: Secondary | ICD-10-CM | POA: Diagnosis not present

## 2014-08-20 NOTE — Telephone Encounter (Signed)
Received lab report of cbc from [facility].  WBC 21.17.  Dr Burr Medico OK with not doing CBC 08/22/14.  Spoke to pt & C-met may be ordered at 08/22/14 visit.  Will clarify with Dr Burr Medico if any labs need to be done.  Recent c-met 08/15/14.

## 2014-08-21 ENCOUNTER — Other Ambulatory Visit: Payer: Self-pay | Admitting: Hematology

## 2014-08-21 ENCOUNTER — Other Ambulatory Visit: Payer: Self-pay | Admitting: *Deleted

## 2014-08-22 ENCOUNTER — Encounter: Payer: Self-pay | Admitting: *Deleted

## 2014-08-22 ENCOUNTER — Other Ambulatory Visit: Payer: Medicare Other

## 2014-08-22 ENCOUNTER — Ambulatory Visit (HOSPITAL_BASED_OUTPATIENT_CLINIC_OR_DEPARTMENT_OTHER): Payer: Medicare Other

## 2014-08-22 ENCOUNTER — Other Ambulatory Visit (HOSPITAL_BASED_OUTPATIENT_CLINIC_OR_DEPARTMENT_OTHER): Payer: Medicare Other

## 2014-08-22 VITALS — BP 96/54 | HR 63 | Temp 97.0°F | Resp 18

## 2014-08-22 DIAGNOSIS — C50211 Malignant neoplasm of upper-inner quadrant of right female breast: Secondary | ICD-10-CM

## 2014-08-22 DIAGNOSIS — Z5189 Encounter for other specified aftercare: Secondary | ICD-10-CM

## 2014-08-22 DIAGNOSIS — Z5111 Encounter for antineoplastic chemotherapy: Secondary | ICD-10-CM

## 2014-08-22 DIAGNOSIS — R590 Localized enlarged lymph nodes: Secondary | ICD-10-CM

## 2014-08-22 LAB — COMPREHENSIVE METABOLIC PANEL (CC13)
ALT: 15 U/L (ref 0–55)
AST: 18 U/L (ref 5–34)
Albumin: 3.4 g/dL — ABNORMAL LOW (ref 3.5–5.0)
Alkaline Phosphatase: 116 U/L (ref 40–150)
Anion Gap: 10 mEq/L (ref 3–11)
BUN: 15.8 mg/dL (ref 7.0–26.0)
CO2: 24 mEq/L (ref 22–29)
Calcium: 8.4 mg/dL (ref 8.4–10.4)
Chloride: 107 mEq/L (ref 98–109)
Creatinine: 0.7 mg/dL (ref 0.6–1.1)
EGFR: 90 mL/min/{1.73_m2} — ABNORMAL LOW (ref >90)
Glucose: 95 mg/dl (ref 70–140)
Potassium: 4.3 mEq/L (ref 3.5–5.1)
Sodium: 141 mEq/L (ref 136–145)
Total Bilirubin: <0.2 mg/dL (ref 0.20–1.20)
Total Protein: 6.6 g/dL (ref 6.4–8.3)

## 2014-08-22 LAB — CBC WITH DIFFERENTIAL/PLATELET
BASO%: 1.3 % (ref 0.0–2.0)
Basophils Absolute: 0.2 10*3/uL — ABNORMAL HIGH (ref 0.0–0.1)
EOS%: 0.8 % (ref 0.0–7.0)
Eosinophils Absolute: 0.1 10*3/uL (ref 0.0–0.5)
HCT: 32.1 % — ABNORMAL LOW (ref 34.8–46.6)
HGB: 10.7 g/dL — ABNORMAL LOW (ref 11.6–15.9)
LYMPH%: 10.4 % — ABNORMAL LOW (ref 14.0–49.7)
MCH: 32.6 pg (ref 25.1–34.0)
MCHC: 33.3 g/dL (ref 31.5–36.0)
MCV: 97.9 fL (ref 79.5–101.0)
MONO#: 1.8 10*3/uL — ABNORMAL HIGH (ref 0.1–0.9)
MONO%: 11.7 % (ref 0.0–14.0)
NEUT#: 11.9 10*3/uL — ABNORMAL HIGH (ref 1.5–6.5)
NEUT%: 75.8 % (ref 38.4–76.8)
Platelets: 288 10*3/uL (ref 145–400)
RBC: 3.28 10*6/uL — ABNORMAL LOW (ref 3.70–5.45)
RDW: 15.1 % — ABNORMAL HIGH (ref 11.2–14.5)
WBC: 15.7 10*3/uL — ABNORMAL HIGH (ref 3.9–10.3)
lymph#: 1.6 10*3/uL (ref 0.9–3.3)

## 2014-08-22 MED ORDER — DOXORUBICIN HCL CHEMO IV INJECTION 2 MG/ML
60.0000 mg/m2 | Freq: Once | INTRAVENOUS | Status: AC
  Administered 2014-08-22: 110 mg via INTRAVENOUS
  Filled 2014-08-22: qty 55

## 2014-08-22 MED ORDER — SODIUM CHLORIDE 0.9 % IV SOLN
600.0000 mg/m2 | Freq: Once | INTRAVENOUS | Status: AC
  Administered 2014-08-22: 1100 mg via INTRAVENOUS
  Filled 2014-08-22: qty 55

## 2014-08-22 MED ORDER — SODIUM CHLORIDE 0.9 % IV SOLN
Freq: Once | INTRAVENOUS | Status: AC
  Administered 2014-08-22: 09:00:00 via INTRAVENOUS

## 2014-08-22 MED ORDER — SODIUM CHLORIDE 0.9 % IV SOLN
Freq: Once | INTRAVENOUS | Status: AC
  Administered 2014-08-22: 10:00:00 via INTRAVENOUS
  Filled 2014-08-22: qty 5

## 2014-08-22 MED ORDER — PALONOSETRON HCL INJECTION 0.25 MG/5ML
0.2500 mg | Freq: Once | INTRAVENOUS | Status: AC
  Administered 2014-08-22: 0.25 mg via INTRAVENOUS

## 2014-08-22 MED ORDER — SODIUM CHLORIDE 0.9 % IJ SOLN
10.0000 mL | INTRAMUSCULAR | Status: DC | PRN
  Administered 2014-08-22: 10 mL
  Filled 2014-08-22: qty 10

## 2014-08-22 MED ORDER — PALONOSETRON HCL INJECTION 0.25 MG/5ML
INTRAVENOUS | Status: AC
  Filled 2014-08-22: qty 5

## 2014-08-22 MED ORDER — HEPARIN SOD (PORK) LOCK FLUSH 100 UNIT/ML IV SOLN
500.0000 [IU] | Freq: Once | INTRAVENOUS | Status: AC | PRN
  Administered 2014-08-22: 500 [IU]
  Filled 2014-08-22: qty 5

## 2014-08-22 MED ORDER — PEGFILGRASTIM 6 MG/0.6ML ~~LOC~~ PSKT
6.0000 mg | PREFILLED_SYRINGE | Freq: Once | SUBCUTANEOUS | Status: AC
  Administered 2014-08-22: 6 mg via SUBCUTANEOUS
  Filled 2014-08-22: qty 0.6

## 2014-08-22 NOTE — Patient Instructions (Signed)
[facility] Discharge Instructions for Patients Receiving Chemotherapy  Today you received the following chemotherapy agents:Adriamycin and Cytoxan   To help prevent nausea and vomiting after your treatment, we encourage you to take your nausea medication as directed.    If you develop nausea and vomiting that is not controlled by your nausea medication, call the clinic.   BELOW ARE SYMPTOMS THAT SHOULD BE REPORTED IMMEDIATELY:  *FEVER GREATER THAN 100.5 F  *CHILLS WITH OR WITHOUT FEVER  NAUSEA AND VOMITING THAT IS NOT CONTROLLED WITH YOUR NAUSEA MEDICATION  *UNUSUAL SHORTNESS OF BREATH  *UNUSUAL BRUISING OR BLEEDING  TENDERNESS IN MOUTH AND THROAT WITH OR WITHOUT PRESENCE OF ULCERS  *URINARY PROBLEMS  *BOWEL PROBLEMS  UNUSUAL RASH Items with * indicate a potential emergency and should be followed up as soon as possible.  Feel free to call the clinic you have any questions or concerns. The clinic phone number is [phone].  Please show the CHEMO ALERT CARD at check-in to the Emergency Department and triage nurse.   

## 2014-08-26 ENCOUNTER — Telehealth: Payer: Self-pay | Admitting: *Deleted

## 2014-08-26 NOTE — Telephone Encounter (Signed)
TC from patient requesting results from lab work done on 08/22/14. Specifically WBC's as she is having some dental work done and dentist needs to know her WBC.   WBC was 15.7.  Pt. Verbalized understanding.

## 2014-08-29 ENCOUNTER — Ambulatory Visit: Payer: Medicare Other

## 2014-08-29 ENCOUNTER — Other Ambulatory Visit: Payer: Medicare Other

## 2014-08-30 ENCOUNTER — Ambulatory Visit: Payer: Medicare Other

## 2014-09-05 ENCOUNTER — Telehealth: Payer: Self-pay | Admitting: Nurse Practitioner

## 2014-09-05 ENCOUNTER — Ambulatory Visit (HOSPITAL_BASED_OUTPATIENT_CLINIC_OR_DEPARTMENT_OTHER): Payer: Medicare Other

## 2014-09-05 ENCOUNTER — Ambulatory Visit (HOSPITAL_BASED_OUTPATIENT_CLINIC_OR_DEPARTMENT_OTHER): Payer: Medicare Other | Admitting: Nurse Practitioner

## 2014-09-05 ENCOUNTER — Other Ambulatory Visit (HOSPITAL_BASED_OUTPATIENT_CLINIC_OR_DEPARTMENT_OTHER): Payer: Medicare Other

## 2014-09-05 VITALS — BP 121/64 | HR 75 | Temp 98.2°F | Resp 18 | Ht 64.0 in | Wt 162.9 lb

## 2014-09-05 DIAGNOSIS — Z5111 Encounter for antineoplastic chemotherapy: Secondary | ICD-10-CM

## 2014-09-05 DIAGNOSIS — D709 Neutropenia, unspecified: Secondary | ICD-10-CM | POA: Diagnosis not present

## 2014-09-05 DIAGNOSIS — C50211 Malignant neoplasm of upper-inner quadrant of right female breast: Secondary | ICD-10-CM

## 2014-09-05 DIAGNOSIS — Z5189 Encounter for other specified aftercare: Secondary | ICD-10-CM

## 2014-09-05 LAB — CBC WITH DIFFERENTIAL/PLATELET
BASO%: 0.7 % (ref 0.0–2.0)
Basophils Absolute: 0.1 10*3/uL (ref 0.0–0.1)
EOS%: 1.1 % (ref 0.0–7.0)
Eosinophils Absolute: 0.2 10*3/uL (ref 0.0–0.5)
HCT: 30.6 % — ABNORMAL LOW (ref 34.8–46.6)
HGB: 10.3 g/dL — ABNORMAL LOW (ref 11.6–15.9)
LYMPH%: 3.7 % — ABNORMAL LOW (ref 14.0–49.7)
MCH: 32.7 pg (ref 25.1–34.0)
MCHC: 33.8 g/dL (ref 31.5–36.0)
MCV: 96.8 fL (ref 79.5–101.0)
MONO#: 1 10*3/uL — ABNORMAL HIGH (ref 0.1–0.9)
MONO%: 4.5 % (ref 0.0–14.0)
NEUT#: 19.1 10*3/uL — ABNORMAL HIGH (ref 1.5–6.5)
NEUT%: 90 % — ABNORMAL HIGH (ref 38.4–76.8)
Platelets: 207 10*3/uL (ref 145–400)
RBC: 3.17 10*6/uL — ABNORMAL LOW (ref 3.70–5.45)
RDW: 15.1 % — ABNORMAL HIGH (ref 11.2–14.5)
WBC: 21.2 10*3/uL — ABNORMAL HIGH (ref 3.9–10.3)
lymph#: 0.8 10*3/uL — ABNORMAL LOW (ref 0.9–3.3)

## 2014-09-05 LAB — COMPREHENSIVE METABOLIC PANEL (CC13)
ALT: 8 U/L (ref 0–55)
AST: 14 U/L (ref 5–34)
Albumin: 3.3 g/dL — ABNORMAL LOW (ref 3.5–5.0)
Alkaline Phosphatase: 145 U/L (ref 40–150)
Anion Gap: 10 mEq/L (ref 3–11)
BUN: 12 mg/dL (ref 7.0–26.0)
CO2: 27 mEq/L (ref 22–29)
Calcium: 8.6 mg/dL (ref 8.4–10.4)
Chloride: 105 mEq/L (ref 98–109)
Creatinine: 0.7 mg/dL (ref 0.6–1.1)
EGFR: 90 mL/min/{1.73_m2} — ABNORMAL LOW (ref >90)
Glucose: 98 mg/dl (ref 70–140)
Potassium: 3.8 mEq/L (ref 3.5–5.1)
Sodium: 141 mEq/L (ref 136–145)
Total Bilirubin: <0.2 mg/dL (ref 0.20–1.20)
Total Protein: 6.6 g/dL (ref 6.4–8.3)

## 2014-09-05 MED ORDER — SODIUM CHLORIDE 0.9 % IJ SOLN
10.0000 mL | INTRAMUSCULAR | Status: DC | PRN
  Filled 2014-09-05: qty 10

## 2014-09-05 MED ORDER — DOXORUBICIN HCL CHEMO IV INJECTION 2 MG/ML
60.0000 mg/m2 | Freq: Once | INTRAVENOUS | Status: AC
  Administered 2014-09-05: 110 mg via INTRAVENOUS
  Filled 2014-09-05: qty 55

## 2014-09-05 MED ORDER — HEPARIN SOD (PORK) LOCK FLUSH 100 UNIT/ML IV SOLN
250.0000 [IU] | INTRAVENOUS | Status: DC | PRN
  Filled 2014-09-05: qty 5

## 2014-09-05 MED ORDER — SODIUM CHLORIDE 0.9 % IJ SOLN
10.0000 mL | INTRAMUSCULAR | Status: DC | PRN
  Administered 2014-09-05 (×2): 10 mL
  Filled 2014-09-05: qty 10

## 2014-09-05 MED ORDER — SODIUM CHLORIDE 0.9 % IV SOLN
Freq: Once | INTRAVENOUS | Status: AC
  Administered 2014-09-05: 11:00:00 via INTRAVENOUS

## 2014-09-05 MED ORDER — PALONOSETRON HCL INJECTION 0.25 MG/5ML
0.2500 mg | Freq: Once | INTRAVENOUS | Status: AC
  Administered 2014-09-05: 0.25 mg via INTRAVENOUS

## 2014-09-05 MED ORDER — SODIUM CHLORIDE 0.9 % IV SOLN
600.0000 mg/m2 | Freq: Once | INTRAVENOUS | Status: AC
  Administered 2014-09-05: 1100 mg via INTRAVENOUS
  Filled 2014-09-05: qty 55

## 2014-09-05 MED ORDER — HEPARIN SOD (PORK) LOCK FLUSH 100 UNIT/ML IV SOLN
500.0000 [IU] | INTRAVENOUS | Status: AC | PRN
  Administered 2014-09-05: 500 [IU]
  Filled 2014-09-05: qty 5

## 2014-09-05 MED ORDER — SODIUM CHLORIDE 0.9 % IV SOLN
Freq: Once | INTRAVENOUS | Status: AC
  Administered 2014-09-05: 11:00:00 via INTRAVENOUS
  Filled 2014-09-05: qty 5

## 2014-09-05 MED ORDER — PALONOSETRON HCL INJECTION 0.25 MG/5ML
INTRAVENOUS | Status: AC
  Filled 2014-09-05: qty 5

## 2014-09-05 MED ORDER — PEGFILGRASTIM 6 MG/0.6ML ~~LOC~~ PSKT
6.0000 mg | PREFILLED_SYRINGE | Freq: Once | SUBCUTANEOUS | Status: AC
  Administered 2014-09-05: 6 mg via SUBCUTANEOUS
  Filled 2014-09-05: qty 0.6

## 2014-09-05 NOTE — Patient Instructions (Signed)
[facility] Discharge Instructions for Patients Receiving Chemotherapy  Today you received the following chemotherapy agents:Adriamycin and Cytoxan   To help prevent nausea and vomiting after your treatment, we encourage you to take your nausea medication as directed.    If you develop nausea and vomiting that is not controlled by your nausea medication, call the clinic.   BELOW ARE SYMPTOMS THAT SHOULD BE REPORTED IMMEDIATELY:  *FEVER GREATER THAN 100.5 F  *CHILLS WITH OR WITHOUT FEVER  NAUSEA AND VOMITING THAT IS NOT CONTROLLED WITH YOUR NAUSEA MEDICATION  *UNUSUAL SHORTNESS OF BREATH  *UNUSUAL BRUISING OR BLEEDING  TENDERNESS IN MOUTH AND THROAT WITH OR WITHOUT PRESENCE OF ULCERS  *URINARY PROBLEMS  *BOWEL PROBLEMS  UNUSUAL RASH Items with * indicate a potential emergency and should be followed up as soon as possible.  Feel free to call the clinic you have any questions or concerns. The clinic phone number is [phone].  Please show the CHEMO ALERT CARD at check-in to the Emergency Department and triage nurse.   

## 2014-09-05 NOTE — Telephone Encounter (Addendum)
Pt confirmed labs/ov per 06/10 POF, gave pt AVS and Calendar.Cherylann Banas, faxed letter to [facility] per pt stating she was seen in our office today.... [phone], sent msg to add chemo

## 2014-09-05 NOTE — Progress Notes (Signed)
[facility] OFFICE PROGRESS NOTE   Diagnosis:  Breast cancer  Oncology History   Breast cancer of upper-inner quadrant of right female breast  Staging form: Breast, AJCC 7th Edition  Clinical stage from 06/18/2014: Stage IIA (T2, N0, M0) - Unsigned  Staging comments: Staged at breast conference on 3.23.16        Breast cancer of upper-inner quadrant of right female breast   06/05/2014 Breast US ultrasound is performed, showing a 2.4 x 1.5 x 2.3 cm heterogeneous mass at the right breast 1 o'clock 15 cm from nipple palpable area correlating to the mammographic finding. Ultrasound of the right axilla is negative   06/05/2014 Pathology Results INVASIVE MAMMARY CARCINOMA   06/05/2014 Receptors her2 Estrogen Receptor: 0%, NEGATIVE Progesterone Receptor: 0%, NEGATIVE Proliferation Marker Ki67: 98% HER-2/NEU BY CISH - NEGATIVE   06/11/2014 Initial Diagnosis Breast cancer of upper-inner quadrant of right female breast   07/02/2014 PET scan 1. Intensely hypermetabolic mass in the medial RIGHT chest wall with hypermetabolic right axillary node 2. Hypermetabolic nodal metastasis in the LEFT level 2 and LEFT supraclavicular and periportal node (SUV 5).    07/04/2014 Pathology Results Left breast mass biopsy showed DCIS, ER100%, PR 48%   07/10/2014 Pathology Results left [place] node biopsy was negative for malignant cells, benign reactive lymph tissue    07/18/2014 -  Chemotherapy Dose dense Adriamycin and Cytoxan, every 2 weeks, X4, followed by weekly Taxol for 12 weeks.   07/28/2014 Pathology Results Right hilar and are lymph node biopsy showed no malignant cells, mixed lymphoid population.   08/11/2014 Miscellaneous nadir CBC: WBC 0.54, hemoglobin 8.9, hematocrit 26%, platelet 125         INTERVAL HISTORY:   Ms. Vanderpool returns as scheduled. She completed cycle 3 dose dense Adriamycin/Cytoxan 08/22/2014. She had mild nausea for a few days  after the chemotherapy. The nausea was controlled with her home medications. No vomiting. No mouth sores. No diarrhea or constipation. No skin rash. Appetite varies. Weight is stable. She denies pain. No shortness of breath, fever or cough. She feels the right breast mass is smaller.  Objective:  Vital signs in last 24 hours:  Blood pressure 121/64, pulse 75, temperature 98.2 F (36.8 C), temperature source Oral, resp. rate 18, height '5\' 4"'  (1.626 m), weight 162 lb 14.4 oz (73.891 kg), SpO2 99 %.    HEENT: No thrush or ulcers. Lymphatics: No palpable cervical, supra clavicular or axillary lymph nodes. Resp: Lungs clear bilaterally. Cardio: Regular rate and rhythm. GI: Abdomen soft and nontender. No hepatomegaly. Vascular: No leg edema.  Skin: No rash. Breast: Approximate 4.5 x 6 cm mass upper inner quadrant right breast.  Port-A-Cath without erythema.  Lab Results:  Lab Results  Component Value Date   WBC 21.2* 09/05/2014   HGB 10.3* 09/05/2014   HCT 30.6* 09/05/2014   MCV 96.8 09/05/2014   PLT 207 09/05/2014   NEUTROABS 19.1* 09/05/2014    Imaging:  No results found.  Medications: I have reviewed the patient's current medications.  Assessment/Plan: 1. Poorly differentiated mammary carcinoma in her right breast, cT2NxM0, triple negative; left breast DCIS, ER+/PR+. Dose dense Adriamycin/Cytoxan initiated 07/18/2014. 2. Neutropenia following cycle 2 Adriamycin/Cytoxan. Cycle 3 delayed for one week. Neulasta added beginning with cycle 3.   Disposition: Ms. Bress appears stable. She has completed 3 cycles of dose dense Adriamycin/Cytoxan. Plan to proceed with the fourth and final cycle today as scheduled. She will return for a follow-up visit and initiation of every other  week Taxol on 09/19/2014. We reviewed potential toxicities associated with Taxol including myelosuppression, nausea, mouth sores, constipation or diarrhea, allergic reaction, neuropathy symptoms. She will  contact the office prior to her next visit with any problems.    Ned Card ANP/GNP-BC   09/05/2014  9:09 AM

## 2014-09-19 ENCOUNTER — Ambulatory Visit (HOSPITAL_BASED_OUTPATIENT_CLINIC_OR_DEPARTMENT_OTHER): Payer: Medicare Other

## 2014-09-19 ENCOUNTER — Other Ambulatory Visit (HOSPITAL_BASED_OUTPATIENT_CLINIC_OR_DEPARTMENT_OTHER): Payer: Medicare Other

## 2014-09-19 ENCOUNTER — Telehealth: Payer: Self-pay | Admitting: *Deleted

## 2014-09-19 ENCOUNTER — Telehealth: Payer: Self-pay | Admitting: Hematology

## 2014-09-19 ENCOUNTER — Encounter: Payer: Self-pay | Admitting: Hematology

## 2014-09-19 ENCOUNTER — Ambulatory Visit (HOSPITAL_BASED_OUTPATIENT_CLINIC_OR_DEPARTMENT_OTHER): Payer: Medicare Other | Admitting: Hematology

## 2014-09-19 VITALS — BP 112/68 | HR 77 | Temp 97.6°F | Resp 16

## 2014-09-19 VITALS — BP 118/68 | HR 78 | Temp 98.1°F | Resp 18 | Ht 64.0 in | Wt 161.6 lb

## 2014-09-19 DIAGNOSIS — Z5111 Encounter for antineoplastic chemotherapy: Secondary | ICD-10-CM | POA: Diagnosis present

## 2014-09-19 DIAGNOSIS — C50211 Malignant neoplasm of upper-inner quadrant of right female breast: Secondary | ICD-10-CM

## 2014-09-19 LAB — COMPREHENSIVE METABOLIC PANEL (CC13)
ALT: 11 U/L (ref 0–55)
AST: 14 U/L (ref 5–34)
Albumin: 3.4 g/dL — ABNORMAL LOW (ref 3.5–5.0)
Alkaline Phosphatase: 139 U/L (ref 40–150)
Anion Gap: 8 mEq/L (ref 3–11)
BUN: 12.1 mg/dL (ref 7.0–26.0)
CO2: 28 mEq/L (ref 22–29)
Calcium: 8.8 mg/dL (ref 8.4–10.4)
Chloride: 103 mEq/L (ref 98–109)
Creatinine: 0.7 mg/dL (ref 0.6–1.1)
EGFR: 88 mL/min/{1.73_m2} — ABNORMAL LOW (ref >90)
Glucose: 100 mg/dl (ref 70–140)
Potassium: 4.6 mEq/L (ref 3.5–5.1)
Sodium: 139 mEq/L (ref 136–145)
Total Bilirubin: <0.2 mg/dL (ref 0.20–1.20)
Total Protein: 6.7 g/dL (ref 6.4–8.3)

## 2014-09-19 LAB — CBC WITH DIFFERENTIAL/PLATELET
BASO%: 0.5 % (ref 0.0–2.0)
Basophils Absolute: 0.1 10*3/uL (ref 0.0–0.1)
EOS%: 0.4 % (ref 0.0–7.0)
Eosinophils Absolute: 0.1 10*3/uL (ref 0.0–0.5)
HCT: 27.4 % — ABNORMAL LOW (ref 34.8–46.6)
HGB: 9.2 g/dL — ABNORMAL LOW (ref 11.6–15.9)
LYMPH%: 4 % — ABNORMAL LOW (ref 14.0–49.7)
MCH: 32.8 pg (ref 25.1–34.0)
MCHC: 33.7 g/dL (ref 31.5–36.0)
MCV: 97.3 fL (ref 79.5–101.0)
MONO#: 1.3 10*3/uL — ABNORMAL HIGH (ref 0.1–0.9)
MONO%: 9.4 % (ref 0.0–14.0)
NEUT#: 12.2 10*3/uL — ABNORMAL HIGH (ref 1.5–6.5)
NEUT%: 85.7 % — ABNORMAL HIGH (ref 38.4–76.8)
Platelets: 188 10*3/uL (ref 145–400)
RBC: 2.82 10*6/uL — ABNORMAL LOW (ref 3.70–5.45)
RDW: 15.9 % — ABNORMAL HIGH (ref 11.2–14.5)
WBC: 14.2 10*3/uL — ABNORMAL HIGH (ref 3.9–10.3)
lymph#: 0.6 10*3/uL — ABNORMAL LOW (ref 0.9–3.3)

## 2014-09-19 MED ORDER — SODIUM CHLORIDE 0.9 % IJ SOLN
10.0000 mL | INTRAMUSCULAR | Status: DC | PRN
  Administered 2014-09-19: 10 mL
  Filled 2014-09-19: qty 10

## 2014-09-19 MED ORDER — PACLITAXEL CHEMO INJECTION 300 MG/50ML
80.0000 mg/m2 | Freq: Once | INTRAVENOUS | Status: AC
  Administered 2014-09-19: 144 mg via INTRAVENOUS
  Filled 2014-09-19: qty 24

## 2014-09-19 MED ORDER — HEPARIN SOD (PORK) LOCK FLUSH 100 UNIT/ML IV SOLN
500.0000 [IU] | Freq: Once | INTRAVENOUS | Status: AC | PRN
  Administered 2014-09-19: 500 [IU]
  Filled 2014-09-19: qty 5

## 2014-09-19 MED ORDER — FAMOTIDINE IN NACL 20-0.9 MG/50ML-% IV SOLN
20.0000 mg | Freq: Once | INTRAVENOUS | Status: AC
  Administered 2014-09-19: 20 mg via INTRAVENOUS

## 2014-09-19 MED ORDER — SODIUM CHLORIDE 0.9 % IV SOLN
Freq: Once | INTRAVENOUS | Status: AC
  Administered 2014-09-19: 10:00:00 via INTRAVENOUS
  Filled 2014-09-19: qty 8

## 2014-09-19 MED ORDER — DIPHENHYDRAMINE HCL 50 MG/ML IJ SOLN
50.0000 mg | Freq: Once | INTRAMUSCULAR | Status: AC
  Administered 2014-09-19: 50 mg via INTRAVENOUS

## 2014-09-19 MED ORDER — SODIUM CHLORIDE 0.9 % IV SOLN
Freq: Once | INTRAVENOUS | Status: AC
  Administered 2014-09-19: 09:00:00 via INTRAVENOUS

## 2014-09-19 MED ORDER — DIPHENHYDRAMINE HCL 50 MG/ML IJ SOLN
INTRAMUSCULAR | Status: AC
  Filled 2014-09-19: qty 1

## 2014-09-19 MED ORDER — FAMOTIDINE IN NACL 20-0.9 MG/50ML-% IV SOLN
INTRAVENOUS | Status: AC
  Filled 2014-09-19: qty 50

## 2014-09-19 NOTE — Progress Notes (Signed)
0935 Benadryl 50 mg IV slow push given. 4097 pt stated she was feeling "nervous", speech slurred, O2 sat 100%, Selena Lesser, NP over to see patient. Speech clearing, pt able to tell name and DOB, follow all commands. NS increased to 562mls/hr.

## 2014-09-19 NOTE — Telephone Encounter (Signed)
per pof to sch pt appt-sent MW email to sch pt appt-wpt stated will get updated sch b4 leaving trmt room

## 2014-09-19 NOTE — Progress Notes (Signed)
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 Ultrasound of the right axilla is negative   06/05/2014 Pathology Results INVASIVE MAMMARY CARCINOMA   06/05/2014 Receptors her2 Estrogen Receptor: 0%, NEGATIVE Progesterone Receptor: 0%, NEGATIVE Proliferation Marker Ki67: 98%  HER-2/NEU BY CISH - NEGATIVE   06/11/2014 Initial Diagnosis Breast cancer of upper-inner quadrant of right female breast   07/02/2014 PET scan 1. Intensely hypermetabolic mass in the medial RIGHT chest wall with hypermetabolic right axillary node 2. Hypermetabolic nodal metastasis in the LEFT level 2 and LEFT supraclavicular and periportal node (SUV 5).    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   07/28/2014 Pathology Results Right hilar and are lymph node biopsy showed no malignant cells, mixed lymphoid population.   08/11/2014 Miscellaneous nadir CBC: WBC 0.54, hemoglobin 8.9, hematocrit 26%, platelet 125    HISTORY OF PRESENTING ILLNESS:  Kristin Pennington 69 y.o. female is here because of recently diagnosed poorly differentiated carcinoma in her right breast.  She found a lump in her right breast 3 weeks ago, mild tenderness,  No other complains. Last mammo was in oct 2015 which was negative. She feels well overall. She denies any pain, cough, shortness breast, GI symptoms. She has good energy level and appetite, no recent weight loss.  Her last colonoscopy was for 5 years ago which was negative per patient. Her last Pap smear was 3 years ago which was negative per patient.  INTERIM HISTORY: Kristin Pennington returns for follow-up and first cycle of Taxol. She is doing very well overall. She does have some taste change after chemotherapy, but has been eating well. No significant weight loss since she started chemotherapy. No significant pain, nausea, or other discomfort. She has a mild fatigue but functions well at home. No fever or chills.  MEDICAL HISTORY:  Past Medical History  Diagnosis Date  . Wears glasses   . Wears partial dentures     top  . 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 Arthritis     knee  . Breast cancer     bilateral, right breast mass, cancer both breast, x1 chemo  a week ago.  SURGICAL HISTORY: Past Surgical History  Procedure Laterality Date  . Appendectomy    . Cholecystectomy    . Abdominal hysterectomy    . Tonsillectomy    . Colonoscopy    . Portacath placement N/A 06/30/2014    Procedure: INSERTION PORT-A-CATH WITH ULTRA SOUND ON STANDBY;  Surgeon: Fanny Skates, MD;  Location: [facility];  Service: General;  Laterality: N/A;  .  Endobronchial ultrasound Bilateral 07/28/2014    Procedure: ENDOBRONCHIAL ULTRASOUND;  Surgeon: Collene Gobble, MD;  Location: [facility];  Service: Cardiopulmonary;  Laterality: Bilateral;   SOCIAL HISTORY: History   Social History  . Marital Status: Married    Spouse Name: N/A  . Number of Children: N/A  . Years of Education: N/A   Occupational History  . Not on file.   Social History Main Topics  . Smoking status: Former Smoker -- 1.00 packs/day for 18 years    Quit date: 03/28/1988  . Smokeless tobacco: Not on file  . Alcohol Use: No  . Drug Use: No  . Sexual Activity: Not on file   Other Topics Concern  . Not on file   Social History Narrative     GYN HISTORY  Menarchal: 11 LMP: 1994, hysrectomy  Contraceptive: no  HRT: since 27  G2P1, one miscarriage     FAMILY HISTORY: Family History  Problem Relation Age of Onset  . Cancer Father 36    gastric cancer   . Cancer Sister 35    lung cancer     ALLERGIES:  is allergic to aspirin; codeine; diclofenac; meloxicam; nitrofurantoin; other; pyridium; sulfa antibiotics; tetracyclines & related; and tape.  MEDICATIONS:  Current Outpatient Prescriptions  Medication Sig Dispense Refill  . aspirin EC 81 MG tablet Take 81 mg by mouth daily.     . calcium carbonate (OS-CAL) 600 MG TABS tablet Take 600 mg by mouth 2 (two) times daily with a meal.    . cyanocobalamin (,VITAMIN B-12,) 1000 MCG/ML injection Inject 1,000 mcg into the muscle every 30 (thirty) days.     Marland Kitchen levETIRAcetam (KEPPRA) 500 MG tablet Take 1 tablet by mouth 2 (two) times daily.    Marland Kitchen lidocaine-prilocaine (EMLA) cream Apply 1 application topically as needed. Apply to portacath  1 hour to 1 1/2 hours prior to procedures as needed.   Cover with clear plastic. 30 g 1  . oxaprozin (DAYPRO) 600 MG tablet Take 1 tablet by mouth 2 (two) times daily.    . phenytoin (DILANTIN) 100 MG ER capsule Take 1 tablet by mouth 4 (four) times daily. On  Sat , Sun, Tues,  Wed, and  Thursdays    -  Take   173m  QID.    .Marland Kitchenzolpidem (AMBIEN) 5 MG tablet Take 1 tablet (5 mg total) by mouth at bedtime as needed for sleep. 30 tablet 0  . HYDROcodone-acetaminophen (NORCO) 5-325 MG per tablet Take 1-2 tablets by mouth every 6 (six) hours as needed for moderate pain or severe pain. (Patient not taking: Reported on 09/19/2014) 30 tablet 0   No current facility-administered medications for this visit.    REVIEW OF SYSTEMS:   Constitutional: Denies fevers, chills or abnormal night sweats Eyes: Denies blurriness of vision, double vision or watery eyes Ears, nose, mouth, throat, and face: Denies mucositis or sore throat Respiratory: Denies cough, dyspnea or wheezes Cardiovascular: Denies palpitation, chest discomfort or lower extremity swelling Gastrointestinal:  Denies nausea, heartburn or change in bowel habits Skin: Denies abnormal skin rashes  Lymphatics: Denies new lymphadenopathy or easy bruising Neurological:Denies numbness, tingling or new weaknesses Behavioral/Psych: Mood is stable, no new changes  All other systems were reviewed with the patient and are negative.  PHYSICAL EXAMINATION: ECOG PERFORMANCE STATUS: 0 - Asymptomatic  BP 118/68 mmHg  Pulse 78  Temp(Src) 98.1 F (36.7 C) (Oral)  Resp 18  Ht '5\' 4"'  (1.626 m)  Wt 161 lb 9.6 oz (73.301 kg)  BMI 27.72 kg/m2  SpO2 100% GENERAL:alert, no distress and comfortable SKIN: skin color, texture, turgor are normal, no rashes or significant lesions EYES: normal, conjunctiva are pink and non-injected, sclera clear OROPHARYNX:no exudate, no erythema and lips, buccal mucosa, and tongue normal  NECK: supple, thyroid normal size, non-tender, without nodularity LYMPH:  no palpable lymphadenopathy in the cervical, axillary or inguinal LUNGS: clear to auscultation and percussion with normal breathing effort HEART: regular rate & rhythm and no murmurs and no lower extremity edema ABDOMEN:abdomen soft, non-tender and  normal bowel sounds Musculoskeletal:no cyanosis of digits and no clubbing  PSYCH: alert & oriented x 3 with fluent speech NEURO: no focal motor/sensory deficits Breasts: Breast inspection showed them to be symmetrical with no nipple discharge. There is a 3X5cm (initially 4.5X6.0cm) mass in the 10 clock positionof her right breast, non-tender. Palpation of the left breasts and axilla revealed no obvious mass that I could appreciate.   LABORATORY DATA:  I have reviewed the data as listed CBC Latest Ref Rng 09/19/2014 09/05/2014 08/22/2014  WBC 3.9 - 10.3 10e3/uL 14.2(H) 21.2(H) 15.7(H)  Hemoglobin 11.6 - 15.9 g/dL 9.2(L) 10.3(L) 10.7(L)  Hematocrit 34.8 - 46.6 % 27.4(L) 30.6(L) 32.1(L)  Platelets 145 - 400 10e3/uL 188 207 288    CMP Latest Ref Rng 09/05/2014 08/22/2014 08/15/2014  Glucose 70 - 140 mg/dl 98 95 84  BUN 7.0 - 26.0 mg/dL 12.0 15.8 13.2  Creatinine 0.6 - 1.1 mg/dL 0.7 0.7 0.7  Sodium 136 - 145 mEq/L 141 141 143  Potassium 3.5 - 5.1 mEq/L 3.8 4.3 4.6  Chloride 96 - 112 mmol/L - - -  CO2 22 - 29 mEq/L '27 24 26  ' Calcium 8.4 - 10.4 mg/dL 8.6 8.4 8.4  Total Protein 6.4 - 8.3 g/dL 6.6 6.6 6.4  Total Bilirubin 0.20 - 1.20 mg/dL <0.20 <0.20 <0.20  Alkaline Phos 40 - 150 U/L 145 116 108  AST 5 - 34 U/L '14 18 15  ' ALT 0 - 55 U/L '8 15 11      ' PATHOLOGY REPORT 06/05/2014 Diagnosis Breast, right, needle core biopsy - INVASIVE MAMMARY CARCINOMA, SEE COMMENT. Microscopic Comment Although the grade of tumor is best assessed at resection, with these biopsies, the invasive tumor is grade III. The tumor expresses cytokeratin AE1/3 immunostain and is negative for S-100 and melanin A immunostains. Breast prognostic studies  Results: Estrogen Receptor: 0%, NEGATIVE Progesterone Receptor: 0%, NEGATIVE Proliferation Marker Ki67: 98% COMMENT: The negative hormone receptor study(ies) in this case have an internal positive control.  HER-2/NEU BY CISH - NEGATIVE. RESULT RATIO OF HER2: CEP 17  SIGNALS 1.31 AVERAGE HER2 COPY NUMBER PER CELL 2.10  Diagnosis Breast, left, needle core biopsy, UOQ - DUCTAL CARCINOMA IN-SITU. - SEE COMMENT. Microscopic Comment The ductal carcinoma in situ is low grade. The carcinoma cells are positive for e-cadherin and cytokeratin AE1/AE3. Smooth muscle myosin, calponin, and p63 stains highlight the presence of myoepithelial cells. Results: IMMUNOHISTOCHEMICAL AND MORPHOMETRIC ANALYSIS BY THE AUTOMATED CELLULAR IMAGING SYSTEM (ACIS) Estrogen Receptor: 100%, POSITIVE, STRONG STAINING INTENSITY Progesterone Receptor: 28%, POSITIVE, WEAK STAINING INTENSITY  Diagnosis 07/10/2014 Lymph node, needle/core biopsy, Left supraclavicular - BENIGN REACTIVE LYMPH NODE TISSUE. - NO METASTATIC CARCINOMA IDENTIFIED.  Diagnosis 07/28/2014  FINE NEEDLE ASPIRATION, ENDOSCOPIC, 10R LYMPH NODE(SPECIMEN 1 OF 3 COLLECTED (07/28/14): NO MALIGNANT CELLS IDENTIFIED. MIXED LYMPHOID POPULATION.  RADIOGRAPHIC STUDIES: I have personally reviewed the radiological images as listed and agreed with the findings in the report.  ASSESSMENT & PLAN:  69 year old postmenopausal woman, with past medical history of seizure and arthritis, presented with a palpable mass in the inner upper quadrant of her right breast. Biopsy revealed poorly differentiated carcinoma, triple negative.  1. 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 -The PET scan showed hypermetabolic lymph nodes in the left supraclavicular, left cervical level II, right hilar and a periportal area. Although they are suspicious for distant note metastasis, both left [place] and right hilar node biopsy were negaative, so likely reactive adenopathy. -I discussed the [place] node and right hilar node biopsy which showed benign reactive lymph  tissue -we have discussed her case in our breast tumor board, the consensus is proceed with neoadjuvant chemotherapy, followed by surgery and radiation. -I recommended to start chemotherapy with dose dense Adriamycin and Cytoxan X4, followed by by biweekly TaxolX4, to maximally control her breast cancer. She agrees -She has completed 4 cycles of dose dense before meals, however her breast mass has only shunk by 20-30%. Given the suboptimal response to before meals, I recommend the next chemotherapy to be weekly carbo and Taxol, giving her triple negative disease. Potential side effects from [place] and Taxol were discussed with patient, she agrees to proceed. -will repeat PET scan after she finishes chemotherapy -Lab reviewed, adequate for treatment. We'll start first cycle with Taxol today, due to the pending insurance approval for carboplatin. We'll start a [place] and Taxol next week.  Plan: -Starting weekly Taxol today, and a change to weekly [place] and Taxol next week, pending insurance approval for carboplatin -I'll see her back next week  All questions were answered. The patient knows to call the clinic with any problems, questions or concerns. I spent 20 minutes counseling the patient face to face. The total time spent in the appointment was 30 minutes and more than 50% was on counseling.     Truitt Merle, MD 09/19/2014 8:28 AM

## 2014-09-19 NOTE — Patient Instructions (Signed)
Paclitaxel injection What is this medicine? PACLITAXEL (PAK li TAX el) is a chemotherapy drug. It targets fast dividing cells, like cancer cells, and causes these cells to die. This medicine is used to treat ovarian cancer, breast cancer, and other cancers. This medicine may be used for other purposes; ask your health care provider or pharmacist if you have questions. COMMON BRAND NAME(S): Onxol, Taxol What should I tell my health care provider before I take this medicine? They need to know if you have any of these conditions: -blood disorders -irregular heartbeat -infection (especially a virus infection such as chickenpox, cold sores, or herpes) -liver disease -previous or ongoing radiation therapy -an unusual or allergic reaction to paclitaxel, alcohol, polyoxyethylated castor oil, other chemotherapy agents, other medicines, foods, dyes, or preservatives -pregnant or trying to get pregnant -breast-feeding How should I use this medicine? This drug is given as an infusion into a vein. It is administered in a hospital or clinic by a specially trained health care professional. Talk to your pediatrician regarding the use of this medicine in children. Special care may be needed. Overdosage: If you think you have taken too much of this medicine contact a poison control center or emergency room at once. NOTE: This medicine is only for you. Do not share this medicine with others. What if I miss a dose? It is important not to miss your dose. Call your doctor or health care professional if you are unable to keep an appointment. What may interact with this medicine? Do not take this medicine with any of the following medications: -disulfiram -metronidazole This medicine may also interact with the following medications: -cyclosporine -diazepam -ketoconazole -medicines to increase blood counts like filgrastim, pegfilgrastim, sargramostim -other chemotherapy drugs like cisplatin, doxorubicin,  epirubicin, etoposide, teniposide, vincristine -quinidine -testosterone -vaccines -verapamil Talk to your doctor or health care professional before taking any of these medicines: -acetaminophen -aspirin -ibuprofen -ketoprofen -naproxen This list may not describe all possible interactions. Give your health care provider a list of all the medicines, herbs, non-prescription drugs, or dietary supplements you use. Also tell them if you smoke, drink alcohol, or use illegal drugs. Some items may interact with your medicine. What should I watch for while using this medicine? Your condition will be monitored carefully while you are receiving this medicine. You will need important blood work done while you are taking this medicine. This drug may make you feel generally unwell. This is not uncommon, as chemotherapy can affect healthy cells as well as cancer cells. Report any side effects. Continue your course of treatment even though you feel ill unless your doctor tells you to stop. In some cases, you may be given additional medicines to help with side effects. Follow all directions for their use. Call your doctor or health care professional for advice if you get a fever, chills or sore throat, or other symptoms of a cold or flu. Do not treat yourself. This drug decreases your body's ability to fight infections. Try to avoid being around people who are sick. This medicine may increase your risk to bruise or bleed. Call your doctor or health care professional if you notice any unusual bleeding. Be careful brushing and flossing your teeth or using a toothpick because you may get an infection or bleed more easily. If you have any dental work done, tell your dentist you are receiving this medicine. Avoid taking products that contain aspirin, acetaminophen, ibuprofen, naproxen, or ketoprofen unless instructed by your doctor. These medicines may hide a fever.   Do not become pregnant while taking this medicine.  Women should inform their doctor if they wish to become pregnant or think they might be pregnant. There is a potential for serious side effects to an unborn child. Talk to your health care professional or pharmacist for more information. Do not breast-feed an infant while taking this medicine. Men are advised not to father a child while receiving this medicine. What side effects may I notice from receiving this medicine? Side effects that you should report to your doctor or health care professional as soon as possible: -allergic reactions like skin rash, itching or hives, swelling of the face, lips, or tongue -low blood counts - This drug may decrease the number of white blood cells, red blood cells and platelets. You may be at increased risk for infections and bleeding. -signs of infection - fever or chills, cough, sore throat, pain or difficulty passing urine -signs of decreased platelets or bleeding - bruising, pinpoint red spots on the skin, black, tarry stools, nosebleeds -signs of decreased red blood cells - unusually weak or tired, fainting spells, lightheadedness -breathing problems -chest pain -high or low blood pressure -mouth sores -nausea and vomiting -pain, swelling, redness or irritation at the injection site -pain, tingling, numbness in the hands or feet -slow or irregular heartbeat -swelling of the ankle, feet, hands Side effects that usually do not require medical attention (report to your doctor or health care professional if they continue or are bothersome): -bone pain -complete hair loss including hair on your head, underarms, pubic hair, eyebrows, and eyelashes -changes in the color of fingernails -diarrhea -loosening of the fingernails -loss of appetite -muscle or joint pain -red flush to skin -sweating This list may not describe all possible side effects. Call your doctor for medical advice about side effects. You may report side effects to FDA at  1-800-FDA-1088. Where should I keep my medicine? This drug is given in a hospital or clinic and will not be stored at home. NOTE: This sheet is a summary. It may not cover all possible information. If you have questions about this medicine, talk to your doctor, pharmacist, or health care provider.  2015, [employer]vier/Gold Standard. (2012-05-07 16:41:21)  [facility] Discharge Instructions for Patients Receiving Chemotherapy  Today you received the following chemotherapy agents Taxol  To help prevent nausea and vomiting after your treatment, we encourage you to take your nausea medication as needed   If you develop nausea and vomiting that is not controlled by your nausea medication, call the clinic.   BELOW ARE SYMPTOMS THAT SHOULD BE REPORTED IMMEDIATELY:  *FEVER GREATER THAN 100.5 F  *CHILLS WITH OR WITHOUT FEVER  NAUSEA AND VOMITING THAT IS NOT CONTROLLED WITH YOUR NAUSEA MEDICATION  *UNUSUAL SHORTNESS OF BREATH  *UNUSUAL BRUISING OR BLEEDING  TENDERNESS IN MOUTH AND THROAT WITH OR WITHOUT PRESENCE OF ULCERS  *URINARY PROBLEMS  *BOWEL PROBLEMS  UNUSUAL RASH Items with * indicate a potential emergency and should be followed up as soon as possible.  Feel free to call the clinic you have any questions or concerns. The clinic phone number is [phone].  Please show the [facility] at check-in to the Emergency Department and triage nurse.

## 2014-09-19 NOTE — Progress Notes (Signed)
Addendum:   Patient was given Benadryl 50 mg IV as a premedication prior to her initial cycle of chemotherapy.  Patient experienced some extreme sleepiness and mild shortness of breath.  Initially after receiving the Benadryl.  O2 was placed via nasal cannula for comfort.  O2 sats remained at 99-100% throughout.  The rest of patient's vital signs were stable as well.  Patient did recover from all of her symptoms; and was able to proceed with chemotherapy as planned today.

## 2014-09-19 NOTE — Telephone Encounter (Signed)
Per desk RN I have rescheduled appts for next week. Patient aware

## 2014-09-19 NOTE — Telephone Encounter (Signed)
Per staff message and POF I have scheduled appts. Advised scheduler of appts and that 7/1 MD visit to late for treatment. JMW

## 2014-09-22 ENCOUNTER — Telehealth: Payer: Self-pay | Admitting: *Deleted

## 2014-09-22 NOTE — Telephone Encounter (Signed)
Called pt to f/u on chemo that she had 09/19/14.  She reports that she did fine over the w/e with no c/o's.  She only had concerns with the benadryl that she received which she states was too much & she was afraid that she was having a seizure.  She was seen by Selena Lesser NP & got OK.  She knows she can call with any problems or concerns.

## 2014-09-26 ENCOUNTER — Ambulatory Visit: Payer: Medicare Other | Admitting: Hematology

## 2014-09-26 ENCOUNTER — Other Ambulatory Visit: Payer: Medicare Other

## 2014-09-26 ENCOUNTER — Other Ambulatory Visit (HOSPITAL_BASED_OUTPATIENT_CLINIC_OR_DEPARTMENT_OTHER): Payer: Medicare Other

## 2014-09-26 ENCOUNTER — Ambulatory Visit (HOSPITAL_BASED_OUTPATIENT_CLINIC_OR_DEPARTMENT_OTHER): Payer: Medicare Other | Admitting: Hematology

## 2014-09-26 VITALS — BP 116/58 | HR 82 | Temp 98.5°F | Resp 18 | Ht 64.0 in | Wt 165.4 lb

## 2014-09-26 DIAGNOSIS — R591 Generalized enlarged lymph nodes: Secondary | ICD-10-CM | POA: Diagnosis not present

## 2014-09-26 DIAGNOSIS — C50211 Malignant neoplasm of upper-inner quadrant of right female breast: Secondary | ICD-10-CM | POA: Diagnosis not present

## 2014-09-26 LAB — COMPREHENSIVE METABOLIC PANEL (CC13)
ALT: 14 U/L (ref 0–55)
AST: 15 U/L (ref 5–34)
Albumin: 3.3 g/dL — ABNORMAL LOW (ref 3.5–5.0)
Alkaline Phosphatase: 116 U/L (ref 40–150)
Anion Gap: 11 mEq/L (ref 3–11)
BUN: 18.9 mg/dL (ref 7.0–26.0)
CO2: 24 mEq/L (ref 22–29)
Calcium: 8.9 mg/dL (ref 8.4–10.4)
Chloride: 105 mEq/L (ref 98–109)
Creatinine: 0.7 mg/dL (ref 0.6–1.1)
EGFR: 88 mL/min/{1.73_m2} — ABNORMAL LOW (ref >90)
Glucose: 95 mg/dl (ref 70–140)
Potassium: 4 mEq/L (ref 3.5–5.1)
Sodium: 140 mEq/L (ref 136–145)
Total Bilirubin: 0.25 mg/dL (ref 0.20–1.20)
Total Protein: 6.6 g/dL (ref 6.4–8.3)

## 2014-09-26 LAB — CBC WITH DIFFERENTIAL/PLATELET
BASO%: 2.4 % — ABNORMAL HIGH (ref 0.0–2.0)
Basophils Absolute: 0.1 10*3/uL (ref 0.0–0.1)
EOS%: 1.1 % (ref 0.0–7.0)
Eosinophils Absolute: 0 10*3/uL (ref 0.0–0.5)
HCT: 26.5 % — ABNORMAL LOW (ref 34.8–46.6)
HGB: 8.9 g/dL — ABNORMAL LOW (ref 11.6–15.9)
LYMPH%: 16.8 % (ref 14.0–49.7)
MCH: 33 pg (ref 25.1–34.0)
MCHC: 33.6 g/dL (ref 31.5–36.0)
MCV: 98.1 fL (ref 79.5–101.0)
MONO#: 0.5 10*3/uL (ref 0.1–0.9)
MONO%: 13.4 % (ref 0.0–14.0)
NEUT#: 2.5 10*3/uL (ref 1.5–6.5)
NEUT%: 66.3 % (ref 38.4–76.8)
Platelets: 266 10*3/uL (ref 145–400)
RBC: 2.7 10*6/uL — ABNORMAL LOW (ref 3.70–5.45)
RDW: 16.6 % — ABNORMAL HIGH (ref 11.2–14.5)
WBC: 3.8 10*3/uL — ABNORMAL LOW (ref 3.9–10.3)
lymph#: 0.6 10*3/uL — ABNORMAL LOW (ref 0.9–3.3)

## 2014-09-26 NOTE — Progress Notes (Signed)
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 Ultrasound of the right axilla is negative   06/05/2014 Pathology Results INVASIVE MAMMARY CARCINOMA   06/05/2014 Receptors her2 Estrogen Receptor: 0%, NEGATIVE Progesterone Receptor: 0%, NEGATIVE Proliferation Marker Ki67: 98%  HER-2/NEU BY CISH - NEGATIVE   06/11/2014 Initial Diagnosis Breast cancer of upper-inner quadrant of right female breast   07/02/2014 PET scan 1. Intensely hypermetabolic mass in the medial RIGHT chest wall with hypermetabolic right axillary node 2. Hypermetabolic nodal metastasis in the LEFT level 2 and LEFT supraclavicular and periportal node (SUV 5).    07/04/2014 Pathology Results Left breast mass biopsy showed DCIS, ER100%, PR 48%   07/10/2014 Pathology Results left [place] node biopsy was negative for malignant cells, benign reactive lymph tissue    07/18/2014 -   Chemotherapy Dose dense Adriamycin and Cytoxan, every 2 weeks, X4, followed by weekly Taxol for 12 weeks.   07/28/2014 Pathology Results Right hilar and are lymph node biopsy showed no malignant cells, mixed lymphoid population.   08/11/2014 Miscellaneous nadir CBC: WBC 0.54, hemoglobin 8.9, hematocrit 26%, platelet 125    HISTORY OF PRESENTING ILLNESS:  Kristin Pennington 69 y.o. female is here because of recently diagnosed poorly differentiated carcinoma in her right breast.  She found a lump in her right breast 3 weeks ago, mild tenderness,  No other complains. Last mammo was in oct 2015 which was negative. She feels well overall. She denies any pain, cough, shortness breast, GI symptoms. She has good energy level and appetite, no recent weight loss.  Her last colonoscopy was for 5 years ago which was negative per patient. Her last Pap smear was 3 years ago which was negative per patient.  INTERIM HISTORY: Kristin Pennington returns for follow-up. She tolerated the first cycle Taxol very well last week, better than AC. No noticeable side effects. She has good appetite and decent energy level. She denies any pain, nausea, or other discomfort.   MEDICAL HISTORY:  Past Medical History  Diagnosis Date  . Wears glasses   . Wears partial dentures     top  . 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 Arthritis     knee  . Breast cancer     bilateral, right breast mass, cancer both breast, x1 chemo  a week ago.     SURGICAL HISTORY: Past Surgical History  Procedure Laterality Date  . Appendectomy    . Cholecystectomy    .  Abdominal hysterectomy    . Tonsillectomy    . Colonoscopy    . Portacath placement N/A 06/30/2014    Procedure: INSERTION PORT-A-CATH WITH ULTRA SOUND ON STANDBY;  Surgeon: Fanny Skates, MD;  Location: [facility];  Service: General;  Laterality: N/A;  . Endobronchial ultrasound Bilateral 07/28/2014    Procedure: ENDOBRONCHIAL ULTRASOUND;  Surgeon: Collene Gobble, MD;   Location: [facility];  Service: Cardiopulmonary;  Laterality: Bilateral;   SOCIAL HISTORY: History   Social History  . Marital Status: Married    Spouse Name: N/A  . Number of Children: N/A  . Years of Education: N/A   Occupational History  . Not on file.   Social History Main Topics  . Smoking status: Former Smoker -- 1.00 packs/day for 18 years    Quit date: 03/28/1988  . Smokeless tobacco: Not on file  . Alcohol Use: No  . Drug Use: No  . Sexual Activity: Not on file   Other Topics Concern  . Not on file   Social History Narrative     GYN HISTORY  Menarchal: 11 LMP: 1994, hysrectomy  Contraceptive: no  HRT: since 85  G2P1, one miscarriage     FAMILY HISTORY: Family History  Problem Relation Age of Onset  . Cancer Father 26    gastric cancer   . Cancer Sister 34    lung cancer     ALLERGIES:  is allergic to aspirin; codeine; diclofenac; meloxicam; nitrofurantoin; other; pyridium; sulfa antibiotics; tetracyclines & related; and tape.  MEDICATIONS:  Current Outpatient Prescriptions  Medication Sig Dispense Refill  . aspirin EC 81 MG tablet Take 81 mg by mouth daily.     . calcium carbonate (OS-CAL) 600 MG TABS tablet Take 600 mg by mouth 2 (two) times daily with a meal.    . cyanocobalamin (,VITAMIN B-12,) 1000 MCG/ML injection Inject 1,000 mcg into the muscle every 30 (thirty) days.     Marland Kitchen HYDROcodone-acetaminophen (NORCO) 5-325 MG per tablet Take 1-2 tablets by mouth every 6 (six) hours as needed for moderate pain or severe pain. (Patient not taking: Reported on 09/19/2014) 30 tablet 0  . levETIRAcetam (KEPPRA) 500 MG tablet Take 1 tablet by mouth 2 (two) times daily.    Marland Kitchen lidocaine-prilocaine (EMLA) cream Apply 1 application topically as needed. Apply to portacath  1 hour to 1 1/2 hours prior to procedures as needed.   Cover with clear plastic. 30 g 1  . oxaprozin (DAYPRO) 600 MG tablet Take 1 tablet by mouth 2 (two) times daily.    . phenytoin  (DILANTIN) 100 MG ER capsule Take 1 tablet by mouth 4 (four) times daily. On  Sat , Sun, Tues, Wed, and  Thursdays    -  Take   158m  QID.    .Marland Kitchenzolpidem (AMBIEN) 5 MG tablet Take 1 tablet (5 mg total) by mouth at bedtime as needed for sleep. 30 tablet 0   No current facility-administered medications for this visit.    REVIEW OF SYSTEMS:   Constitutional: Denies fevers, chills or abnormal night sweats Eyes: Denies blurriness of vision, double vision or watery eyes Ears, nose, mouth, throat, and face: Denies mucositis or sore throat Respiratory: Denies cough, dyspnea or wheezes Cardiovascular: Denies palpitation, chest discomfort or lower extremity swelling Gastrointestinal:  Denies nausea, heartburn or change in bowel habits Skin: Denies abnormal skin rashes Lymphatics: Denies new lymphadenopathy or easy bruising Neurological:Denies numbness, tingling or new weaknesses Behavioral/Psych: Mood is stable, no new changes  All other systems were reviewed with the patient and are negative.  PHYSICAL EXAMINATION: ECOG PERFORMANCE STATUS: 0 - Asymptomatic  BP 116/58 mmHg  Pulse 82  Temp(Src) 98.5 F (36.9 C) (Oral)  Resp 18  Ht '5\' 4"'  (1.626 m)  Wt 165 lb 6.4 oz (75.025 kg)  BMI 28.38 kg/m2  SpO2 99%  GENERAL:alert, no distress and comfortable SKIN: skin color, texture, turgor are normal, no rashes or significant lesions EYES: normal, conjunctiva are pink and non-injected, sclera clear OROPHARYNX:no exudate, no erythema and lips, buccal mucosa, and tongue normal  NECK: supple, thyroid normal size, non-tender, without nodularity LYMPH:  no palpable lymphadenopathy in the cervical, axillary or inguinal LUNGS: clear to auscultation and percussion with normal breathing effort HEART: regular rate & rhythm and no murmurs and no lower extremity edema ABDOMEN:abdomen soft, non-tender and normal bowel sounds Musculoskeletal:no cyanosis of digits and no clubbing  PSYCH: alert & oriented x 3 with  fluent speech NEURO: no focal motor/sensory deficits Breasts: Breast inspection showed them to be symmetrical with no nipple discharge. There is a 3X5cm (initially 4.5X6.0cm) mass in the 10 clock positionof her right breast, non-tender. Palpation of the left breasts and axilla revealed no obvious mass that I could appreciate.   LABORATORY DATA:  I have reviewed the data as listed CBC Latest Ref Rng 09/19/2014 09/05/2014 08/22/2014  WBC 3.9 - 10.3 10e3/uL 14.2(H) 21.2(H) 15.7(H)  Hemoglobin 11.6 - 15.9 g/dL 9.2(L) 10.3(L) 10.7(L)  Hematocrit 34.8 - 46.6 % 27.4(L) 30.6(L) 32.1(L)  Platelets 145 - 400 10e3/uL 188 207 288    CMP Latest Ref Rng 09/19/2014 09/05/2014 08/22/2014  Glucose 70 - 140 mg/dl 100 98 95  BUN 7.0 - 26.0 mg/dL 12.1 12.0 15.8  Creatinine 0.6 - 1.1 mg/dL 0.7 0.7 0.7  Sodium 136 - 145 mEq/L 139 141 141  Potassium 3.5 - 5.1 mEq/L 4.6 3.8 4.3  Chloride 96 - 112 mmol/L - - -  CO2 22 - 29 mEq/L '28 27 24  ' Calcium 8.4 - 10.4 mg/dL 8.8 8.6 8.4  Total Protein 6.4 - 8.3 g/dL 6.7 6.6 6.6  Total Bilirubin 0.20 - 1.20 mg/dL <0.20 <0.20 <0.20  Alkaline Phos 40 - 150 U/L 139 145 116  AST 5 - 34 U/L '14 14 18  ' ALT 0 - 55 U/L '11 8 15      ' PATHOLOGY REPORT 06/05/2014 Diagnosis Breast, right, needle core biopsy - INVASIVE MAMMARY CARCINOMA, SEE COMMENT. Microscopic Comment Although the grade of tumor is best assessed at resection, with these biopsies, the invasive tumor is grade III. The tumor expresses cytokeratin AE1/3 immunostain and is negative for S-100 and melanin A immunostains. Breast prognostic studies  Results: Estrogen Receptor: 0%, NEGATIVE Progesterone Receptor: 0%, NEGATIVE Proliferation Marker Ki67: 98% COMMENT: The negative hormone receptor study(ies) in this case have an internal positive control.  HER-2/NEU BY CISH - NEGATIVE. RESULT RATIO OF HER2: CEP 17 SIGNALS 1.31 AVERAGE HER2 COPY NUMBER PER CELL 2.10  Diagnosis Breast, left, needle core biopsy, UOQ -  DUCTAL CARCINOMA IN-SITU. - SEE COMMENT. Microscopic Comment The ductal carcinoma in situ is low grade. The carcinoma cells are positive for e-cadherin and cytokeratin AE1/AE3. Smooth muscle myosin, calponin, and p63 stains highlight the presence of myoepithelial cells. Results: IMMUNOHISTOCHEMICAL AND MORPHOMETRIC ANALYSIS BY THE AUTOMATED CELLULAR IMAGING SYSTEM (ACIS) Estrogen Receptor: 100%, POSITIVE, STRONG STAINING INTENSITY Progesterone Receptor: 28%, POSITIVE, WEAK STAINING INTENSITY  Diagnosis 07/10/2014 Lymph node, needle/core biopsy, Left supraclavicular - BENIGN REACTIVE LYMPH NODE TISSUE. - NO METASTATIC CARCINOMA IDENTIFIED.  Diagnosis 07/28/2014  FINE NEEDLE ASPIRATION, ENDOSCOPIC, 10R LYMPH NODE(SPECIMEN 1 OF 3 COLLECTED (07/28/14): NO MALIGNANT CELLS IDENTIFIED. MIXED LYMPHOID POPULATION.  RADIOGRAPHIC STUDIES: I have personally reviewed the radiological images as listed and agreed with the findings in the report.  ASSESSMENT & PLAN:  69 year old postmenopausal woman, with past medical history of seizure and arthritis, presented with a palpable mass in the inner upper quadrant of her right breast. Biopsy revealed poorly differentiated carcinoma, triple negative.  1. 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 -The PET scan showed hypermetabolic lymph nodes in the left supraclavicular, left cervical level II, right hilar and a periportal area. Although they are suspicious for distant note metastasis, both left [place] and right hilar node biopsy were negaative, so likely reactive adenopathy. -I discussed the [place] node and right hilar node biopsy which showed benign reactive lymph tissue -we have discussed her case in our breast tumor board, the consensus is proceed with neoadjuvant chemotherapy,  followed by surgery and radiation. -I recommended to start chemotherapy with dose dense Adriamycin and Cytoxan X4, followed by by biweekly TaxolX4, to maximally control her breast cancer. She agrees -She has completed 4 cycles of dose dense before meals, however her breast mass has only shunk by 20-30%. Given the suboptimal response to [facility], I recommend the next chemotherapy to be weekly carbo and Taxol, giving her triple negative disease. Potential side effects from [place] and Taxol were discussed with patient, she agrees to proceed. -will repeat PET scan after she finishes chemotherapy -Due to the scheduling error, she is not scheduled for chemotherapy today. She will return next week for cycle 2 carbo and Taxol.  Plan: -Starting [place] and Taxol next week -She will see APP Lattie Haw next week   All questions were answered. The patient knows to call the clinic with any problems, questions or concerns. I spent 20 minutes counseling the patient face to face. The total time spent in the appointment was 30 minutes and more than 50% was on counseling.     Truitt Merle, MD 09/26/2014 8:44 AM

## 2014-09-27 ENCOUNTER — Encounter: Payer: Self-pay | Admitting: Hematology

## 2014-10-03 ENCOUNTER — Other Ambulatory Visit: Payer: Self-pay | Admitting: Hematology and Oncology

## 2014-10-03 ENCOUNTER — Other Ambulatory Visit: Payer: Self-pay | Admitting: Nurse Practitioner

## 2014-10-03 ENCOUNTER — Ambulatory Visit (HOSPITAL_BASED_OUTPATIENT_CLINIC_OR_DEPARTMENT_OTHER): Payer: Medicare Other | Admitting: Nurse Practitioner

## 2014-10-03 ENCOUNTER — Other Ambulatory Visit (HOSPITAL_BASED_OUTPATIENT_CLINIC_OR_DEPARTMENT_OTHER): Payer: Medicare Other

## 2014-10-03 ENCOUNTER — Ambulatory Visit (HOSPITAL_BASED_OUTPATIENT_CLINIC_OR_DEPARTMENT_OTHER): Payer: Medicare Other

## 2014-10-03 ENCOUNTER — Telehealth: Payer: Self-pay | Admitting: *Deleted

## 2014-10-03 ENCOUNTER — Encounter: Payer: Self-pay | Admitting: *Deleted

## 2014-10-03 VITALS — BP 126/68 | HR 74 | Temp 98.2°F | Resp 18 | Ht 64.0 in | Wt 163.6 lb

## 2014-10-03 DIAGNOSIS — C50211 Malignant neoplasm of upper-inner quadrant of right female breast: Secondary | ICD-10-CM

## 2014-10-03 DIAGNOSIS — Z5111 Encounter for antineoplastic chemotherapy: Secondary | ICD-10-CM

## 2014-10-03 LAB — CBC WITH DIFFERENTIAL/PLATELET
BASO%: 1.9 % (ref 0.0–2.0)
Basophils Absolute: 0.1 10*3/uL (ref 0.0–0.1)
EOS%: 2.3 % (ref 0.0–7.0)
Eosinophils Absolute: 0.1 10*3/uL (ref 0.0–0.5)
HCT: 30.5 % — ABNORMAL LOW (ref 34.8–46.6)
HGB: 10.1 g/dL — ABNORMAL LOW (ref 11.6–15.9)
LYMPH%: 18.8 % (ref 14.0–49.7)
MCH: 33.3 pg (ref 25.1–34.0)
MCHC: 33.1 g/dL (ref 31.5–36.0)
MCV: 100.7 fL (ref 79.5–101.0)
MONO#: 0.5 10*3/uL (ref 0.1–0.9)
MONO%: 17.5 % — ABNORMAL HIGH (ref 0.0–14.0)
NEUT#: 1.8 10*3/uL (ref 1.5–6.5)
NEUT%: 59.5 % (ref 38.4–76.8)
Platelets: 170 10*3/uL (ref 145–400)
RBC: 3.03 10*6/uL — ABNORMAL LOW (ref 3.70–5.45)
RDW: 17.2 % — ABNORMAL HIGH (ref 11.2–14.5)
WBC: 3.1 10*3/uL — ABNORMAL LOW (ref 3.9–10.3)
lymph#: 0.6 10*3/uL — ABNORMAL LOW (ref 0.9–3.3)

## 2014-10-03 LAB — COMPREHENSIVE METABOLIC PANEL (CC13)
ALT: 17 U/L (ref 0–55)
AST: 18 U/L (ref 5–34)
Albumin: 3.5 g/dL (ref 3.5–5.0)
Alkaline Phosphatase: 118 U/L (ref 40–150)
Anion Gap: 9 mEq/L (ref 3–11)
BUN: 17.1 mg/dL (ref 7.0–26.0)
CO2: 25 mEq/L (ref 22–29)
Calcium: 9.1 mg/dL (ref 8.4–10.4)
Chloride: 105 mEq/L (ref 98–109)
Creatinine: 0.7 mg/dL (ref 0.6–1.1)
EGFR: 88 mL/min/{1.73_m2} — ABNORMAL LOW (ref >90)
Glucose: 94 mg/dl (ref 70–140)
Potassium: 4.2 mEq/L (ref 3.5–5.1)
Sodium: 138 mEq/L (ref 136–145)
Total Bilirubin: 0.25 mg/dL (ref 0.20–1.20)
Total Protein: 6.8 g/dL (ref 6.4–8.3)

## 2014-10-03 MED ORDER — SODIUM CHLORIDE 0.9 % IJ SOLN
10.0000 mL | INTRAMUSCULAR | Status: DC | PRN
  Administered 2014-10-03: 10 mL
  Filled 2014-10-03: qty 10

## 2014-10-03 MED ORDER — DIPHENHYDRAMINE HCL 50 MG/ML IJ SOLN
INTRAMUSCULAR | Status: AC
  Filled 2014-10-03: qty 1

## 2014-10-03 MED ORDER — HEPARIN SOD (PORK) LOCK FLUSH 100 UNIT/ML IV SOLN
500.0000 [IU] | Freq: Once | INTRAVENOUS | Status: AC | PRN
  Administered 2014-10-03: 500 [IU]
  Filled 2014-10-03: qty 5

## 2014-10-03 MED ORDER — PACLITAXEL CHEMO INJECTION 300 MG/50ML
80.0000 mg/m2 | Freq: Once | INTRAVENOUS | Status: AC
  Administered 2014-10-03: 144 mg via INTRAVENOUS
  Filled 2014-10-03: qty 24

## 2014-10-03 MED ORDER — SODIUM CHLORIDE 0.9 % IV SOLN
170.0000 mg | Freq: Once | INTRAVENOUS | Status: AC
  Administered 2014-10-03: 170 mg via INTRAVENOUS
  Filled 2014-10-03: qty 17

## 2014-10-03 MED ORDER — SODIUM CHLORIDE 0.9 % IV SOLN
Freq: Once | INTRAVENOUS | Status: AC
  Administered 2014-10-03: 10:00:00 via INTRAVENOUS

## 2014-10-03 MED ORDER — SODIUM CHLORIDE 0.9 % IV SOLN
Freq: Once | INTRAVENOUS | Status: AC
  Administered 2014-10-03: 11:00:00 via INTRAVENOUS
  Filled 2014-10-03: qty 8

## 2014-10-03 MED ORDER — DIPHENHYDRAMINE HCL 50 MG/ML IJ SOLN
50.0000 mg | Freq: Once | INTRAMUSCULAR | Status: AC
  Administered 2014-10-03: 50 mg via INTRAVENOUS

## 2014-10-03 MED ORDER — FAMOTIDINE IN NACL 20-0.9 MG/50ML-% IV SOLN
20.0000 mg | Freq: Once | INTRAVENOUS | Status: AC
  Administered 2014-10-03: 20 mg via INTRAVENOUS

## 2014-10-03 MED ORDER — FAMOTIDINE IN NACL 20-0.9 MG/50ML-% IV SOLN
INTRAVENOUS | Status: AC
  Filled 2014-10-03: qty 50

## 2014-10-03 NOTE — Telephone Encounter (Signed)
Faxed documentation of visits 09/26/14 & 10/03/14 to [facility] @ [phone] per Ned Card NP.

## 2014-10-03 NOTE — Progress Notes (Signed)
13:20 pm Per Ned Card, NP, it's OK to proceed with chemotherapy.

## 2014-10-03 NOTE — Progress Notes (Signed)
Patient stated,"I'm numb in my hands, feet and legs. I feel funny. Dr. Burr Medico told me last week that she was decreasing my dose of Benadryl to 25 mg." This RN went and spoke with Ned Card, NP, and informed her of the situation. Lattie Haw saw the patient in the infusion room. Patient stated,"I'm feeling better." Per Lattie Haw, let's not start chemo until the numbness resolves. Lattie Haw will come back to the infusion room prior to starting chemotherapy. Patient and family member verbalized understanding.

## 2014-10-03 NOTE — Progress Notes (Signed)
[facility] OFFICE PROGRESS NOTE   Diagnosis: Breast cancer  Oncology History   Breast cancer of upper-inner quadrant of right female breast  Staging form: Breast, AJCC 7th Edition  Clinical stage from 06/18/2014: Stage IIA (T2, N0, M0) - Unsigned  Staging comments: Staged at breast conference on 3.23.16        Breast cancer of upper-inner quadrant of right female breast   06/05/2014 Breast US ultrasound is performed, showing a 2.4 x 1.5 x 2.3 cm heterogeneous mass at the right breast 1 o'clock 15 cm from nipple palpable area correlating to the mammographic finding. Ultrasound of the right axilla is negative   06/05/2014 Pathology Results INVASIVE MAMMARY CARCINOMA   06/05/2014 Receptors her2 Estrogen Receptor: 0%, NEGATIVE Progesterone Receptor: 0%, NEGATIVE Proliferation Marker Ki67: 98% HER-2/NEU BY CISH - NEGATIVE   06/11/2014 Initial Diagnosis Breast cancer of upper-inner quadrant of right female breast   07/02/2014 PET scan 1. Intensely hypermetabolic mass in the medial RIGHT chest wall with hypermetabolic right axillary node 2. Hypermetabolic nodal metastasis in the LEFT level 2 and LEFT supraclavicular and periportal node (SUV 5).    07/04/2014 Pathology Results Left breast mass biopsy showed DCIS, ER100%, PR 48%   07/10/2014 Pathology Results left [place] node biopsy was negative for malignant cells, benign reactive lymph tissue    07/18/2014 -  Chemotherapy Dose dense Adriamycin and Cytoxan, every 2 weeks, X4, followed by weekly Taxol for 12 weeks.   07/28/2014 Pathology Results Right hilar and are lymph node biopsy showed no malignant cells, mixed lymphoid population.   08/11/2014 Miscellaneous nadir CBC: WBC 0.54, hemoglobin 8.9, hematocrit 26%, platelet 125               INTERVAL HISTORY:   Ms. Dobosz returns as scheduled. She completed the first weekly Taxol on  09/19/2014. Per Dr. Ernestina Penna office note 09/26/2014 she did not receive chemotherapy that day due to a scheduling error. She feels well. She denies nausea/vomiting. No mouth sores. No diarrhea. No numbness or tingling in her hands or feet. She has a good appetite. No shortness of breath. No cough. No fever. No urinary symptoms.  Objective:  Vital signs in last 24 hours:  Blood pressure 126/68, pulse 74, temperature 98.2 F (36.8 C), temperature source Oral, resp. rate 18, height '5\' 4"'  (1.626 m), weight 163 lb 9.6 oz (74.208 kg), SpO2 100 %.    HEENT: No thrush or ulcers. Lymphatics: No palpable cervical, supra clavicular lymph nodes. Resp: Lungs clear bilaterally. Cardio: Regular rate and rhythm. GI: Abdomen soft and nontender. No hepatomegaly. Vascular: No leg edema. Calves soft and nontender. Port-A-Cath without erythema.   Lab Results:  Lab Results  Component Value Date   WBC 3.1* 10/03/2014   HGB 10.1* 10/03/2014   HCT 30.5* 10/03/2014   MCV 100.7 10/03/2014   PLT 170 10/03/2014   NEUTROABS 1.8 10/03/2014    Imaging:  No results found.  Medications: I have reviewed the patient's current medications.  Assessment/Plan: 1. Poorly differentiated mammary carcinoma right breast, cT2NxM0, triple negative; left breast DCIS, ER+/PR+. Dose dense Adriamycin/Cytoxan initiated 07/18/2014. Cycle 4 completed 09/05/2014. Weekly Taxol initiated 09/19/2014. She did not receive chemotherapy 09/26/2014 due to a scheduling error. Per Dr. Ernestina Penna office note dated 09/26/2014 the plan is to begin weekly Taxol plus carboplatin with treatment today due to a suboptimal response following Adriamycin/Cytoxan.    Disposition: Ms. Hammes appears stable. Plan to proceed with weekly Taxol plus carboplatin today as scheduled. She will return for a  follow-up visit in one week. She will contact the office in the interim with any problems.    Ned Card ANP/GNP-BC   10/03/2014  9:09  AM

## 2014-10-03 NOTE — Patient Instructions (Signed)
[facility] Discharge Instructions for Patients Receiving Chemotherapy  Today you received the following chemotherapy agents :  Taxol,  Carboplatin.  To help prevent nausea and vomiting after your treatment, we encourage you to take your nausea medication as prescribed.   If you develop nausea and vomiting that is not controlled by your nausea medication, call the clinic.   BELOW ARE SYMPTOMS THAT SHOULD BE REPORTED IMMEDIATELY:  *FEVER GREATER THAN 100.5 F  *CHILLS WITH OR WITHOUT FEVER  NAUSEA AND VOMITING THAT IS NOT CONTROLLED WITH YOUR NAUSEA MEDICATION  *UNUSUAL SHORTNESS OF BREATH  *UNUSUAL BRUISING OR BLEEDING  TENDERNESS IN MOUTH AND THROAT WITH OR WITHOUT PRESENCE OF ULCERS  *URINARY PROBLEMS  *BOWEL PROBLEMS  UNUSUAL RASH Items with * indicate a potential emergency and should be followed up as soon as possible.  Feel free to call the clinic you have any questions or concerns. The clinic phone number is [phone].  Please show the CHEMO ALERT CARD at check-in to the Emergency Department and triage nurse.   

## 2014-10-08 ENCOUNTER — Other Ambulatory Visit: Payer: Self-pay | Admitting: Hematology

## 2014-10-10 ENCOUNTER — Encounter: Payer: Self-pay | Admitting: *Deleted

## 2014-10-10 ENCOUNTER — Ambulatory Visit (HOSPITAL_BASED_OUTPATIENT_CLINIC_OR_DEPARTMENT_OTHER): Payer: Medicare Other

## 2014-10-10 ENCOUNTER — Other Ambulatory Visit (HOSPITAL_BASED_OUTPATIENT_CLINIC_OR_DEPARTMENT_OTHER): Payer: Medicare Other

## 2014-10-10 ENCOUNTER — Other Ambulatory Visit: Payer: Self-pay | Admitting: *Deleted

## 2014-10-10 VITALS — BP 118/67 | HR 71 | Temp 97.6°F | Resp 18

## 2014-10-10 DIAGNOSIS — C50211 Malignant neoplasm of upper-inner quadrant of right female breast: Secondary | ICD-10-CM

## 2014-10-10 DIAGNOSIS — Z5111 Encounter for antineoplastic chemotherapy: Secondary | ICD-10-CM

## 2014-10-10 LAB — COMPREHENSIVE METABOLIC PANEL (CC13)
ALT: 15 U/L (ref 0–55)
AST: 16 U/L (ref 5–34)
Albumin: 3.5 g/dL (ref 3.5–5.0)
Alkaline Phosphatase: 109 U/L (ref 40–150)
Anion Gap: 7 mEq/L (ref 3–11)
BUN: 14.2 mg/dL (ref 7.0–26.0)
CO2: 27 mEq/L (ref 22–29)
Calcium: 9.2 mg/dL (ref 8.4–10.4)
Chloride: 105 mEq/L (ref 98–109)
Creatinine: 0.7 mg/dL (ref 0.6–1.1)
EGFR: 89 mL/min/{1.73_m2} — ABNORMAL LOW (ref >90)
Glucose: 92 mg/dl (ref 70–140)
Potassium: 4 mEq/L (ref 3.5–5.1)
Sodium: 139 mEq/L (ref 136–145)
Total Bilirubin: 0.23 mg/dL (ref 0.20–1.20)
Total Protein: 6.8 g/dL (ref 6.4–8.3)

## 2014-10-10 LAB — CBC WITH DIFFERENTIAL/PLATELET
BASO%: 2.4 % — ABNORMAL HIGH (ref 0.0–2.0)
Basophils Absolute: 0.1 10*3/uL (ref 0.0–0.1)
EOS%: 8.9 % — ABNORMAL HIGH (ref 0.0–7.0)
Eosinophils Absolute: 0.3 10*3/uL (ref 0.0–0.5)
HCT: 29.2 % — ABNORMAL LOW (ref 34.8–46.6)
HGB: 9.9 g/dL — ABNORMAL LOW (ref 11.6–15.9)
LYMPH%: 20.2 % (ref 14.0–49.7)
MCH: 33.7 pg (ref 25.1–34.0)
MCHC: 33.8 g/dL (ref 31.5–36.0)
MCV: 99.5 fL (ref 79.5–101.0)
MONO#: 0.4 10*3/uL (ref 0.1–0.9)
MONO%: 11.5 % (ref 0.0–14.0)
NEUT#: 1.9 10*3/uL (ref 1.5–6.5)
NEUT%: 57 % (ref 38.4–76.8)
Platelets: 236 10*3/uL (ref 145–400)
RBC: 2.93 10*6/uL — ABNORMAL LOW (ref 3.70–5.45)
RDW: 16.5 % — ABNORMAL HIGH (ref 11.2–14.5)
WBC: 3.4 10*3/uL — ABNORMAL LOW (ref 3.9–10.3)
lymph#: 0.7 10*3/uL — ABNORMAL LOW (ref 0.9–3.3)

## 2014-10-10 MED ORDER — DIPHENHYDRAMINE HCL 50 MG/ML IJ SOLN
25.0000 mg | Freq: Once | INTRAMUSCULAR | Status: AC
  Administered 2014-10-10: 25 mg via INTRAVENOUS

## 2014-10-10 MED ORDER — HEPARIN SOD (PORK) LOCK FLUSH 100 UNIT/ML IV SOLN
500.0000 [IU] | Freq: Once | INTRAVENOUS | Status: AC | PRN
  Administered 2014-10-10: 500 [IU]
  Filled 2014-10-10: qty 5

## 2014-10-10 MED ORDER — PACLITAXEL CHEMO INJECTION 300 MG/50ML
80.0000 mg/m2 | Freq: Once | INTRAVENOUS | Status: AC
  Administered 2014-10-10: 144 mg via INTRAVENOUS
  Filled 2014-10-10: qty 24

## 2014-10-10 MED ORDER — SODIUM CHLORIDE 0.9 % IV SOLN
Freq: Once | INTRAVENOUS | Status: AC
  Administered 2014-10-10: 14:00:00 via INTRAVENOUS
  Filled 2014-10-10: qty 8

## 2014-10-10 MED ORDER — SODIUM CHLORIDE 0.9 % IV SOLN
Freq: Once | INTRAVENOUS | Status: AC
  Administered 2014-10-10: 13:00:00 via INTRAVENOUS

## 2014-10-10 MED ORDER — FAMOTIDINE IN NACL 20-0.9 MG/50ML-% IV SOLN
20.0000 mg | Freq: Once | INTRAVENOUS | Status: AC
  Administered 2014-10-10: 20 mg via INTRAVENOUS

## 2014-10-10 MED ORDER — FAMOTIDINE IN NACL 20-0.9 MG/50ML-% IV SOLN
INTRAVENOUS | Status: AC
  Filled 2014-10-10: qty 50

## 2014-10-10 MED ORDER — SODIUM CHLORIDE 0.9 % IJ SOLN
10.0000 mL | INTRAMUSCULAR | Status: DC | PRN
  Administered 2014-10-10: 10 mL
  Filled 2014-10-10: qty 10

## 2014-10-10 MED ORDER — SODIUM CHLORIDE 0.9 % IV SOLN
171.2000 mg | Freq: Once | INTRAVENOUS | Status: AC
  Administered 2014-10-10: 170 mg via INTRAVENOUS
  Filled 2014-10-10: qty 17

## 2014-10-10 MED ORDER — DIPHENHYDRAMINE HCL 50 MG/ML IJ SOLN
INTRAMUSCULAR | Status: AC
  Filled 2014-10-10: qty 1

## 2014-10-10 NOTE — Patient Instructions (Signed)
[facility] Discharge Instructions for Patients Receiving Chemotherapy  Today you received the following chemotherapy agents Paclitaxel/Carboplatin.   To help prevent nausea and vomiting after your treatment, we encourage you to take your nausea medication as directed.    If you develop nausea and vomiting that is not controlled by your nausea medication, call the clinic.   BELOW ARE SYMPTOMS THAT SHOULD BE REPORTED IMMEDIATELY:  *FEVER GREATER THAN 100.5 F  *CHILLS WITH OR WITHOUT FEVER  NAUSEA AND VOMITING THAT IS NOT CONTROLLED WITH YOUR NAUSEA MEDICATION  *UNUSUAL SHORTNESS OF BREATH  *UNUSUAL BRUISING OR BLEEDING  TENDERNESS IN MOUTH AND THROAT WITH OR WITHOUT PRESENCE OF ULCERS  *URINARY PROBLEMS  *BOWEL PROBLEMS  UNUSUAL RASH Items with * indicate a potential emergency and should be followed up as soon as possible.  Feel free to call the clinic you have any questions or concerns. The clinic phone number is [phone].  Please show the CHEMO ALERT CARD at check-in to the Emergency Department and triage nurse.   

## 2014-10-16 ENCOUNTER — Other Ambulatory Visit: Payer: Self-pay | Admitting: Hematology

## 2014-10-17 ENCOUNTER — Ambulatory Visit (HOSPITAL_BASED_OUTPATIENT_CLINIC_OR_DEPARTMENT_OTHER): Payer: Medicare Other | Admitting: Nurse Practitioner

## 2014-10-17 ENCOUNTER — Ambulatory Visit: Payer: Medicare Other | Admitting: Family

## 2014-10-17 ENCOUNTER — Other Ambulatory Visit (HOSPITAL_BASED_OUTPATIENT_CLINIC_OR_DEPARTMENT_OTHER): Payer: Medicare Other

## 2014-10-17 ENCOUNTER — Encounter: Payer: Self-pay | Admitting: *Deleted

## 2014-10-17 ENCOUNTER — Ambulatory Visit (HOSPITAL_BASED_OUTPATIENT_CLINIC_OR_DEPARTMENT_OTHER): Payer: Medicare Other

## 2014-10-17 ENCOUNTER — Telehealth: Payer: Self-pay | Admitting: *Deleted

## 2014-10-17 ENCOUNTER — Telehealth: Payer: Self-pay | Admitting: Nurse Practitioner

## 2014-10-17 ENCOUNTER — Other Ambulatory Visit: Payer: Self-pay | Admitting: *Deleted

## 2014-10-17 VITALS — BP 127/63 | HR 73 | Temp 97.7°F | Resp 18 | Wt 169.5 lb

## 2014-10-17 DIAGNOSIS — K219 Gastro-esophageal reflux disease without esophagitis: Secondary | ICD-10-CM | POA: Diagnosis not present

## 2014-10-17 DIAGNOSIS — C50211 Malignant neoplasm of upper-inner quadrant of right female breast: Secondary | ICD-10-CM | POA: Diagnosis not present

## 2014-10-17 DIAGNOSIS — Z5111 Encounter for antineoplastic chemotherapy: Secondary | ICD-10-CM | POA: Diagnosis present

## 2014-10-17 LAB — CBC WITH DIFFERENTIAL/PLATELET
BASO%: 1.2 % (ref 0.0–2.0)
Basophils Absolute: 0 10*3/uL (ref 0.0–0.1)
EOS%: 7 % (ref 0.0–7.0)
Eosinophils Absolute: 0.2 10*3/uL (ref 0.0–0.5)
HCT: 28.3 % — ABNORMAL LOW (ref 34.8–46.6)
HGB: 9.5 g/dL — ABNORMAL LOW (ref 11.6–15.9)
LYMPH%: 24.6 % (ref 14.0–49.7)
MCH: 33.9 pg (ref 25.1–34.0)
MCHC: 33.6 g/dL (ref 31.5–36.0)
MCV: 101.1 fL — ABNORMAL HIGH (ref 79.5–101.0)
MONO#: 0.3 10*3/uL (ref 0.1–0.9)
MONO%: 12.1 % (ref 0.0–14.0)
NEUT#: 1.4 10*3/uL — ABNORMAL LOW (ref 1.5–6.5)
NEUT%: 55.1 % (ref 38.4–76.8)
Platelets: 163 10*3/uL (ref 145–400)
RBC: 2.8 10*6/uL — ABNORMAL LOW (ref 3.70–5.45)
RDW: 15.7 % — ABNORMAL HIGH (ref 11.2–14.5)
WBC: 2.6 10*3/uL — ABNORMAL LOW (ref 3.9–10.3)
lymph#: 0.6 10*3/uL — ABNORMAL LOW (ref 0.9–3.3)

## 2014-10-17 LAB — COMPREHENSIVE METABOLIC PANEL (CC13)
ALT: 13 U/L (ref 0–55)
AST: 17 U/L (ref 5–34)
Albumin: 3.4 g/dL — ABNORMAL LOW (ref 3.5–5.0)
Alkaline Phosphatase: 92 U/L (ref 40–150)
Anion Gap: 7 mEq/L (ref 3–11)
BUN: 13.7 mg/dL (ref 7.0–26.0)
CO2: 28 mEq/L (ref 22–29)
Calcium: 8.5 mg/dL (ref 8.4–10.4)
Chloride: 105 mEq/L (ref 98–109)
Creatinine: 0.7 mg/dL (ref 0.6–1.1)
EGFR: 90 mL/min/{1.73_m2} — ABNORMAL LOW (ref >90)
Glucose: 113 mg/dl (ref 70–140)
Potassium: 4 mEq/L (ref 3.5–5.1)
Sodium: 140 mEq/L (ref 136–145)
Total Bilirubin: 0.24 mg/dL (ref 0.20–1.20)
Total Protein: 6.6 g/dL (ref 6.4–8.3)

## 2014-10-17 MED ORDER — DIPHENHYDRAMINE HCL 50 MG/ML IJ SOLN
INTRAMUSCULAR | Status: AC
  Filled 2014-10-17: qty 1

## 2014-10-17 MED ORDER — FAMOTIDINE IN NACL 20-0.9 MG/50ML-% IV SOLN
20.0000 mg | Freq: Once | INTRAVENOUS | Status: AC
  Administered 2014-10-17: 20 mg via INTRAVENOUS

## 2014-10-17 MED ORDER — SODIUM CHLORIDE 0.9 % IV SOLN
171.2000 mg | Freq: Once | INTRAVENOUS | Status: AC
  Administered 2014-10-17: 170 mg via INTRAVENOUS
  Filled 2014-10-17: qty 17

## 2014-10-17 MED ORDER — DIPHENHYDRAMINE HCL 50 MG/ML IJ SOLN
25.0000 mg | Freq: Once | INTRAMUSCULAR | Status: AC
  Administered 2014-10-17: 25 mg via INTRAVENOUS

## 2014-10-17 MED ORDER — SODIUM CHLORIDE 0.9 % IJ SOLN
10.0000 mL | INTRAMUSCULAR | Status: DC | PRN
  Administered 2014-10-17: 10 mL
  Filled 2014-10-17: qty 10

## 2014-10-17 MED ORDER — FAMOTIDINE IN NACL 20-0.9 MG/50ML-% IV SOLN
INTRAVENOUS | Status: AC
  Filled 2014-10-17: qty 50

## 2014-10-17 MED ORDER — SODIUM CHLORIDE 0.9 % IV SOLN
Freq: Once | INTRAVENOUS | Status: AC
  Administered 2014-10-17: 14:00:00 via INTRAVENOUS

## 2014-10-17 MED ORDER — PACLITAXEL CHEMO INJECTION 300 MG/50ML
80.0000 mg/m2 | Freq: Once | INTRAVENOUS | Status: AC
  Administered 2014-10-17: 144 mg via INTRAVENOUS
  Filled 2014-10-17: qty 24

## 2014-10-17 MED ORDER — SODIUM CHLORIDE 0.9 % IV SOLN
Freq: Once | INTRAVENOUS | Status: AC
  Administered 2014-10-17: 15:00:00 via INTRAVENOUS
  Filled 2014-10-17: qty 8

## 2014-10-17 MED ORDER — HEPARIN SOD (PORK) LOCK FLUSH 100 UNIT/ML IV SOLN
500.0000 [IU] | Freq: Once | INTRAVENOUS | Status: AC | PRN
  Administered 2014-10-17: 500 [IU]
  Filled 2014-10-17: qty 5

## 2014-10-17 NOTE — Telephone Encounter (Signed)
TC to pt to ask if she might be able to come in today at 1130 for lab and see Selena Lesser at [facility]. LM with information.

## 2014-10-17 NOTE — Patient Instructions (Signed)
[facility] Discharge Instructions for Patients Receiving Chemotherapy  Today you received the following chemotherapy agents Paclitaxel/Carboplatin.   To help prevent nausea and vomiting after your treatment, we encourage you to take your nausea medication as directed.    If you develop nausea and vomiting that is not controlled by your nausea medication, call the clinic.   BELOW ARE SYMPTOMS THAT SHOULD BE REPORTED IMMEDIATELY:  *FEVER GREATER THAN 100.5 F  *CHILLS WITH OR WITHOUT FEVER  NAUSEA AND VOMITING THAT IS NOT CONTROLLED WITH YOUR NAUSEA MEDICATION  *UNUSUAL SHORTNESS OF BREATH  *UNUSUAL BRUISING OR BLEEDING  TENDERNESS IN MOUTH AND THROAT WITH OR WITHOUT PRESENCE OF ULCERS  *URINARY PROBLEMS  *BOWEL PROBLEMS  UNUSUAL RASH Items with * indicate a potential emergency and should be followed up as soon as possible.  Feel free to call the clinic you have any questions or concerns. The clinic phone number is [phone].  Please show the CHEMO ALERT CARD at check-in to the Emergency Department and triage nurse.   

## 2014-10-17 NOTE — Progress Notes (Signed)
Patient brought to Infusion room by Selena Lesser NP.  OK for tx today despite CBC ANC of 1.4.

## 2014-10-17 NOTE — Telephone Encounter (Signed)
per [facility] to add to [facility] sch today/pt appt not sch per pof on 7/15 per pof

## 2014-10-18 ENCOUNTER — Encounter: Payer: Self-pay | Admitting: Nurse Practitioner

## 2014-10-18 DIAGNOSIS — K219 Gastro-esophageal reflux disease without esophagitis: Secondary | ICD-10-CM | POA: Insufficient documentation

## 2014-10-18 NOTE — Assessment & Plan Note (Signed)
Patient presents to the [facility] today to receive cycle 4 of her carboplatin/Taxol chemotherapy.  Patient states that she has been tolerating her chemotherapy fairly well; with excessive of some mild reflux symptoms.  She denies any nausea, vomiting, diarrhea, or constipation.  Chills denies any recent fevers or chills.  On exam.  Patient appears well.  Labs obtained today revealed a WBC of 2.6, ANC of 1.4, hemoglobin 9.5, platelet count of 163.  This provider reviewed all lab results with Dr.Feng via telephone.  Patient will proceed with cycle 4 of her chemotherapy today as planned.  Patient has plans to return on 10/31/2014 for labs and Legrand Como a chemotherapy.  She'll also continue with weekly labs.  Of note-patient is requesting all future appointments be scheduled in the early morning if at all possible.

## 2014-10-18 NOTE — Progress Notes (Signed)
SYMPTOM MANAGEMENT CLINIC   HPI: Kristin Pennington 69 y.o. female diagnosed with breast cancer.  Currently undergoing carboplatin/Taxol chemotherapy regimen.   Patient presents to the [facility] today to receive cycle 4 of her carboplatin/Taxol chemotherapy.  Patient states that she has been tolerating her chemotherapy fairly well; with excessive of some mild reflux symptoms.  She denies any nausea, vomiting, diarrhea, or constipation.  Denies any recent fevers or chills.  HPI  ROS  Past Medical History  Diagnosis Date  . Wears glasses   . Wears partial dentures     top  . 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 Arthritis     knee  . Breast cancer     bilateral, right breast mass, cancer both breast, x1 chemo  a week ago.    Past Surgical History  Procedure Laterality Date  . Appendectomy    . Cholecystectomy    . Abdominal hysterectomy    . Tonsillectomy    . Colonoscopy    . Portacath placement N/A 06/30/2014    Procedure: INSERTION PORT-A-CATH WITH ULTRA SOUND ON STANDBY;  Surgeon: Fanny Skates, MD;  Location: [facility];  Service: General;  Laterality: N/A;  . 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    is allergic to aspirin; codeine; diclofenac; meloxicam; nitrofurantoin; other; pyridium; sulfa antibiotics; tetracyclines & related; and tape.    Medication List       This list is accurate as of: 10/17/14 11:59 PM.  Always use your most recent med list.               aspirin EC 81 MG tablet  Take 81 mg by mouth daily.     calcium carbonate 600 MG Tabs tablet  Commonly known as:  OS-CAL  Take 600 mg by mouth 2 (two) times daily with a meal.     cyanocobalamin 1000 MCG/ML injection  Commonly known as:  (VITAMIN B-12)  Inject 1,000 mcg into the muscle every 30 (thirty) days.     HYDROcodone-acetaminophen 5-325 MG per tablet  Commonly known as:  NORCO  Take 1-2 tablets by mouth every 6 (six) hours as needed for moderate pain or severe pain.     levETIRAcetam 500 MG tablet  Commonly known as:  KEPPRA  Take 1 tablet by mouth 2 (two) times daily.     lidocaine-prilocaine cream  Commonly known as:  EMLA  Apply 1 application topically as needed. Apply to portacath  1 hour to 1 1/2 hours prior to procedures as needed.   Cover with clear plastic.     oxaprozin 600 MG tablet  Commonly known as:  DAYPRO  Take 1 tablet by mouth 2 (two) times daily.     phenytoin 100 MG ER capsule  Commonly known as:  DILANTIN  Take 1 tablet by mouth 4 (four) times daily. On  Sat , Sun, Tues, Wed, and  Thursdays    -  Take   147m  QID.     zolpidem 5 MG tablet  Commonly known as:  AMBIEN  Take 1 tablet (5 mg total) by mouth at bedtime as needed for sleep.         PHYSICAL EXAMINATION  Oncology Vitals 10/17/2014 10/10/2014  10/03/2014 09/26/2014 09/19/2014 09/19/2014 09/19/2014  Height - - 163 cm 163 cm - - -  Weight 76.885 kg - 74.208 kg 75.025 kg - - -  Weight (lbs) 169 lbs 8 oz - 163 lbs 10 oz 165 lbs 6 oz - - -  BMI (kg/m2) - - 28.08 kg/m2 28.39 kg/m2 - - -  Temp 97.7 97.6 98.2 98.5 97.6 98.1 97.2  Pulse 73 71 74 82 77 78 73  Resp _0 SpO2 100 100 100 99 - - 100  BSA (m2) - - 1.83 m2 1.84 m2 - - -   BP Readings from Last 3 Encounters:  10/17/14 127/63  10/10/14 118/67  10/03/14 126/68    Physical Exam  Constitutional: She is oriented to person, place, and time and well-developed, well-nourished, and in no distress.  HENT:  Head: Normocephalic and atraumatic.  Mouth/Throat: Oropharynx is clear and moist.  Eyes: Conjunctivae and EOM are normal. Pupils are equal, round, and reactive to light. Right eye exhibits no  discharge. Left eye exhibits no discharge. No scleral icterus.  Neck: Normal range of motion. Neck supple.  Pulmonary/Chest: Effort normal. No respiratory distress.  Musculoskeletal: Normal range of motion. She exhibits no edema or tenderness.  Neurological: She is alert and oriented to person, place, and time. Gait normal.  Skin: Skin is warm and dry.  Psychiatric: Affect normal.  Nursing note and vitals reviewed.   LABORATORY DATA:. Appointment on 10/17/2014  Component Date Value Ref Range Status  . WBC 10/17/2014 2.6* 3.9 - 10.3 10e3/uL Final  . NEUT# 10/17/2014 1.4* 1.5 - 6.5 10e3/uL Final  . HGB 10/17/2014 9.5* 11.6 - 15.9 g/dL Final  . HCT 10/17/2014 28.3* 34.8 - 46.6 % Final  . Platelets 10/17/2014 163  145 - 400 10e3/uL Final  . MCV 10/17/2014 101.1* 79.5 - 101.0 fL Final  . MCH 10/17/2014 33.9  25.1 - 34.0 pg Final  . MCHC 10/17/2014 33.6  31.5 - 36.0 g/dL Final  . RBC 10/17/2014 2.80* 3.70 - 5.45 10e6/uL Final  . RDW 10/17/2014 15.7* 11.2 - 14.5 % Final  . lymph# 10/17/2014 0.6* 0.9 - 3.3 10e3/uL Final  . MONO# 10/17/2014 0.3  0.1 - 0.9 10e3/uL Final  . Eosinophils Absolute 10/17/2014 0.2  0.0 - 0.5 10e3/uL Final  . Basophils Absolute 10/17/2014 0.0  0.0 - 0.1 10e3/uL Final  . NEUT% 10/17/2014 55.1  38.4 - 76.8 % Final  . LYMPH% 10/17/2014 24.6  14.0 - 49.7 % Final  . MONO% 10/17/2014 12.1  0.0 - 14.0 % Final  . EOS% 10/17/2014 7.0  0.0 - 7.0 % Final  . BASO% 10/17/2014 1.2  0.0 - 2.0 % Final  . Sodium 10/17/2014 140  136 - 145 mEq/L Final  . Potassium 10/17/2014 4.0  3.5 - 5.1 mEq/L Final  . Chloride 10/17/2014 105  98 - 109 mEq/L Final  . CO2 10/17/2014 28  22 - 29 mEq/L Final  . Glucose 10/17/2014 113  70 - 140 mg/dl Final  . BUN 10/17/2014 13.7  7.0 - 26.0 mg/dL Final  . Creatinine 10/17/2014 0.7  0.6 - 1.1 mg/dL Final  . Total Bilirubin 10/17/2014 0.24  0.20 - 1.20 mg/dL Final  . Alkaline Phosphatase 10/17/2014 92  40 - 150 U/L Final  . AST 10/17/2014 17  5 -  34 U/L Final  . ALT 10/17/2014 13  0 - 55 U/L Final  . Total Protein 10/17/2014 6.6  6.4 - 8.3 g/dL Final  .  Albumin 10/17/2014 3.4* 3.5 - 5.0 g/dL Final  . Calcium 10/17/2014 8.5  8.4 - 10.4 mg/dL Final  . Anion Gap 10/17/2014 7  3 - 11 mEq/L Final  . EGFR 10/17/2014 90* >90 ml/min/1.73 m2 Final   eGFR is calculated using the CKD-EPI Creatinine Equation (2009)     RADIOGRAPHIC STUDIES: No results found.  ASSESSMENT/PLAN:    Breast cancer of upper-inner quadrant of right female breast Patient presents to the [facility] today to receive cycle 4 of her carboplatin/Taxol chemotherapy.  Patient states that she has been tolerating her chemotherapy fairly well; with excessive of some mild reflux symptoms.  She denies any nausea, vomiting, diarrhea, or constipation.  Chills denies any recent fevers or chills.  On exam.  Patient appears well.  Labs obtained today revealed a WBC of 2.6, ANC of 1.4, hemoglobin 9.5, platelet count of 163.  This provider reviewed all lab results with Dr.Feng via telephone.  Patient will proceed with cycle 4 of her chemotherapy today as planned.  Patient has plans to return on 10/31/2014 for labs and [place] a chemotherapy.  She'll also continue with weekly labs.  Of note-patient is requesting all future appointments be scheduled in the early morning if at all possible.  GERD (gastroesophageal reflux disease) Patient is complaining of some mild reflux symptoms for the past few days.  She has taken no over-the-counter medications for reflux as of yet.  Advised patient to try over-the-counter Prilosec to see if that helps.  Patient stated understanding of all instructions; and was in agreement with this plan of care. The patient knows to call the clinic with any problems, questions or concerns.   Review/collaboration with Dr. Burr Medico regarding all aspects of patient's visit today.   Total time spent with patient was 25 minutes;  with greater than 75 percent of  that time spent in face to face counseling regarding patient's symptoms,  and coordination of care and follow up.  Disclaimer: This note was dictated with voice recognition software. Similar sounding words can inadvertently be transcribed and may not be corrected upon review.   Drue Second, NP 10/18/2014

## 2014-10-18 NOTE — Assessment & Plan Note (Signed)
Patient is complaining of some mild reflux symptoms for the past few days.  She has taken no over-the-counter medications for reflux as of yet.  Advised patient to try over-the-counter Prilosec to see if that helps.

## 2014-10-20 ENCOUNTER — Telehealth: Payer: Self-pay | Admitting: Hematology

## 2014-10-20 NOTE — Telephone Encounter (Signed)
Confirmed appointments moved to earlier on 08/05

## 2014-10-21 ENCOUNTER — Other Ambulatory Visit: Payer: Self-pay | Admitting: Hematology

## 2014-10-21 ENCOUNTER — Telehealth: Payer: Self-pay | Admitting: Hematology

## 2014-10-21 ENCOUNTER — Telehealth: Payer: Self-pay | Admitting: *Deleted

## 2014-10-21 ENCOUNTER — Telehealth: Payer: Self-pay | Admitting: Hematology and Oncology

## 2014-10-21 NOTE — Telephone Encounter (Signed)
Per staff message and POF I have scheduled appts. Advised scheduler of appts. JMW  

## 2014-10-21 NOTE — Telephone Encounter (Signed)
per pof to sch pt MD appt-sent MW email to move trmt to coordinate w/MD appt-will call pt once reply

## 2014-10-21 NOTE — Telephone Encounter (Signed)
per pof to sch pt appt-sent MW email to sch pt taxol-sent Dr Burr Medico a message to see if pt needs to be seen-adv pt will call once reply

## 2014-10-22 ENCOUNTER — Telehealth: Payer: Self-pay | Admitting: *Deleted

## 2014-10-22 ENCOUNTER — Telehealth: Payer: Self-pay | Admitting: Hematology

## 2014-10-22 NOTE — Telephone Encounter (Signed)
per pof to sch pt appt-cld pt to adv of appt ime & appt added w/Feng on 7/29

## 2014-10-22 NOTE — Telephone Encounter (Signed)
I have adjusted 7/29

## 2014-10-24 ENCOUNTER — Telehealth: Payer: Self-pay | Admitting: Hematology

## 2014-10-24 ENCOUNTER — Other Ambulatory Visit (HOSPITAL_BASED_OUTPATIENT_CLINIC_OR_DEPARTMENT_OTHER): Payer: Medicare Other

## 2014-10-24 ENCOUNTER — Ambulatory Visit: Payer: Medicare Other

## 2014-10-24 ENCOUNTER — Encounter: Payer: Self-pay | Admitting: Hematology

## 2014-10-24 ENCOUNTER — Ambulatory Visit (HOSPITAL_BASED_OUTPATIENT_CLINIC_OR_DEPARTMENT_OTHER): Payer: Medicare Other

## 2014-10-24 ENCOUNTER — Ambulatory Visit (HOSPITAL_BASED_OUTPATIENT_CLINIC_OR_DEPARTMENT_OTHER): Payer: Medicare Other | Admitting: Hematology

## 2014-10-24 VITALS — BP 113/88 | HR 77 | Temp 98.3°F | Resp 18 | Ht 64.0 in | Wt 165.9 lb

## 2014-10-24 DIAGNOSIS — C50211 Malignant neoplasm of upper-inner quadrant of right female breast: Secondary | ICD-10-CM | POA: Diagnosis not present

## 2014-10-24 DIAGNOSIS — Z5111 Encounter for antineoplastic chemotherapy: Secondary | ICD-10-CM

## 2014-10-24 LAB — CBC WITH DIFFERENTIAL/PLATELET
BASO%: 2.3 % — ABNORMAL HIGH (ref 0.0–2.0)
Basophils Absolute: 0.1 10*3/uL (ref 0.0–0.1)
EOS%: 4.3 % (ref 0.0–7.0)
Eosinophils Absolute: 0.1 10*3/uL (ref 0.0–0.5)
HCT: 29.1 % — ABNORMAL LOW (ref 34.8–46.6)
HGB: 9.9 g/dL — ABNORMAL LOW (ref 11.6–15.9)
LYMPH%: 27.3 % (ref 14.0–49.7)
MCH: 34.7 pg — ABNORMAL HIGH (ref 25.1–34.0)
MCHC: 34 g/dL (ref 31.5–36.0)
MCV: 102 fL — ABNORMAL HIGH (ref 79.5–101.0)
MONO#: 0.4 10*3/uL (ref 0.1–0.9)
MONO%: 15.1 % — ABNORMAL HIGH (ref 0.0–14.0)
NEUT#: 1.3 10*3/uL — ABNORMAL LOW (ref 1.5–6.5)
NEUT%: 51 % (ref 38.4–76.8)
Platelets: 202 10*3/uL (ref 145–400)
RBC: 2.85 10*6/uL — ABNORMAL LOW (ref 3.70–5.45)
RDW: 16.5 % — ABNORMAL HIGH (ref 11.2–14.5)
WBC: 2.6 10*3/uL — ABNORMAL LOW (ref 3.9–10.3)
lymph#: 0.7 10*3/uL — ABNORMAL LOW (ref 0.9–3.3)

## 2014-10-24 LAB — COMPREHENSIVE METABOLIC PANEL (CC13)
ALT: 13 U/L (ref 0–55)
AST: 16 U/L (ref 5–34)
Albumin: 3.5 g/dL (ref 3.5–5.0)
Alkaline Phosphatase: 96 U/L (ref 40–150)
Anion Gap: 9 mEq/L (ref 3–11)
BUN: 13.7 mg/dL (ref 7.0–26.0)
CO2: 26 mEq/L (ref 22–29)
Calcium: 8.8 mg/dL (ref 8.4–10.4)
Chloride: 103 mEq/L (ref 98–109)
Creatinine: 0.6 mg/dL (ref 0.6–1.1)
EGFR: >90 mL/min/{1.73_m2} (ref >90)
Glucose: 90 mg/dl (ref 70–140)
Potassium: 4.2 mEq/L (ref 3.5–5.1)
Sodium: 138 mEq/L (ref 136–145)
Total Bilirubin: 0.23 mg/dL (ref 0.20–1.20)
Total Protein: 6.8 g/dL (ref 6.4–8.3)

## 2014-10-24 MED ORDER — DIPHENHYDRAMINE HCL 50 MG/ML IJ SOLN
25.0000 mg | Freq: Once | INTRAMUSCULAR | Status: AC
  Administered 2014-10-24: 25 mg via INTRAVENOUS

## 2014-10-24 MED ORDER — SODIUM CHLORIDE 0.9 % IV SOLN
Freq: Once | INTRAVENOUS | Status: AC
  Administered 2014-10-24: 13:00:00 via INTRAVENOUS

## 2014-10-24 MED ORDER — FAMOTIDINE IN NACL 20-0.9 MG/50ML-% IV SOLN
INTRAVENOUS | Status: AC
Start: 2014-10-24 — End: 2014-10-24
  Filled 2014-10-24: qty 50

## 2014-10-24 MED ORDER — PACLITAXEL CHEMO INJECTION 300 MG/50ML
80.0000 mg/m2 | Freq: Once | INTRAVENOUS | Status: AC
  Administered 2014-10-24: 144 mg via INTRAVENOUS
  Filled 2014-10-24: qty 24

## 2014-10-24 MED ORDER — SODIUM CHLORIDE 0.9 % IV SOLN
Freq: Once | INTRAVENOUS | Status: AC
  Administered 2014-10-24: 13:00:00 via INTRAVENOUS
  Filled 2014-10-24: qty 8

## 2014-10-24 MED ORDER — SODIUM CHLORIDE 0.9 % IJ SOLN
10.0000 mL | INTRAMUSCULAR | Status: DC | PRN
  Administered 2014-10-24: 10 mL
  Filled 2014-10-24: qty 10

## 2014-10-24 MED ORDER — DIPHENHYDRAMINE HCL 50 MG/ML IJ SOLN
INTRAMUSCULAR | Status: AC
  Filled 2014-10-24: qty 1

## 2014-10-24 MED ORDER — FAMOTIDINE IN NACL 20-0.9 MG/50ML-% IV SOLN
20.0000 mg | Freq: Once | INTRAVENOUS | Status: AC
  Administered 2014-10-24: 20 mg via INTRAVENOUS

## 2014-10-24 MED ORDER — SODIUM CHLORIDE 0.9 % IV SOLN
171.2000 mg | Freq: Once | INTRAVENOUS | Status: AC
  Administered 2014-10-24: 170 mg via INTRAVENOUS
  Filled 2014-10-24: qty 17

## 2014-10-24 MED ORDER — HEPARIN SOD (PORK) LOCK FLUSH 100 UNIT/ML IV SOLN
500.0000 [IU] | Freq: Once | INTRAVENOUS | Status: AC | PRN
  Administered 2014-10-24: 500 [IU]
  Filled 2014-10-24: qty 5

## 2014-10-24 NOTE — Patient Instructions (Signed)
[facility] Discharge Instructions for Patients Receiving Chemotherapy  Today you received the following chemotherapy agents:  Taxol and Carboplatin  To help prevent nausea and vomiting after your treatment, we encourage you to take your nausea medication as ordered per MD.   If you develop nausea and vomiting that is not controlled by your nausea medication, call the clinic.   BELOW ARE SYMPTOMS THAT SHOULD BE REPORTED IMMEDIATELY:  *FEVER GREATER THAN 100.5 F  *CHILLS WITH OR WITHOUT FEVER  NAUSEA AND VOMITING THAT IS NOT CONTROLLED WITH YOUR NAUSEA MEDICATION  *UNUSUAL SHORTNESS OF BREATH  *UNUSUAL BRUISING OR BLEEDING  TENDERNESS IN MOUTH AND THROAT WITH OR WITHOUT PRESENCE OF ULCERS  *URINARY PROBLEMS  *BOWEL PROBLEMS  UNUSUAL RASH Items with * indicate a potential emergency and should be followed up as soon as possible.  Feel free to call the clinic you have any questions or concerns. The clinic phone number is [phone].  Please show the CHEMO ALERT CARD at check-in to the Emergency Department and triage nurse.   

## 2014-10-24 NOTE — Telephone Encounter (Signed)
Pt confirmed labs/ov per 07/29 POF, gave pt AVS and Calendar... KJ

## 2014-10-24 NOTE — Progress Notes (Signed)
[facility]  Telephone:([phone]) [phone] Fax:([phone]) [facility] Note   Patient Care Team: Lonzo Cloud, MD as PCP - General (General Practice) Lonzo Cloud, MD (General Practice) Fanny Skates, MD as Consulting Physician (General Surgery) Truitt Merle, MD as Consulting Physician (Hematology) Thea Silversmith, MD as Consulting Physician (Radiation Oncology) Rockwell Germany, RN as Registered Nurse Mauro Kaufmann, RN as Registered Nurse Holley Bouche, NP as Nurse Practitioner (Nurse Practitioner) 10/24/2014  CHIEF COMPLAINTS:  Follow-up triple negative breast cancer  Oncology History   Breast cancer of upper-inner quadrant of right female breast   Staging form: Breast, AJCC 7th Edition     Clinical stage from 06/18/2014: Stage IIA (T2, N0, M0) - Unsigned       Staging comments: Staged at breast conference on 3.23.16        Breast cancer of upper-inner quadrant of right female breast   06/05/2014 Breast US ultrasound is performed, showing a 2.4 x 1.5 x 2.3 cm heterogeneous mass at the right breast 1 o'clock 15 cm from nipple palpable area correlating to the mammographic finding. Ultrasound of the right axilla is negative   06/05/2014 Pathology Results INVASIVE MAMMARY CARCINOMA   06/05/2014 Receptors her2 Estrogen Receptor: 0%, NEGATIVE Progesterone Receptor: 0%, NEGATIVE Proliferation Marker Ki67: 98%  HER-2/NEU BY CISH - NEGATIVE   06/11/2014 Initial Diagnosis Breast cancer of upper-inner quadrant of right female breast   07/02/2014 PET scan 1. Intensely hypermetabolic mass in the medial RIGHT chest wall with hypermetabolic right axillary node 2. Hypermetabolic nodal metastasis in the LEFT level 2 and LEFT supraclavicular and periportal node (SUV 5).    07/04/2014 Pathology Results Left breast mass biopsy showed DCIS, ER100%, PR 48%   07/10/2014 Pathology Results left [place] node biopsy was negative for malignant cells, benign reactive lymph tissue    07/18/2014 -   Chemotherapy Dose dense Adriamycin and Cytoxan, every 2 weeks, X4, followed by weekly Taxol for 12 weeks.   07/28/2014 Pathology Results Right hilar and are lymph node biopsy showed no malignant cells, mixed lymphoid population.   08/11/2014 Miscellaneous nadir CBC: WBC 0.54, hemoglobin 8.9, hematocrit 26%, platelet 125    HISTORY OF PRESENTING ILLNESS:  Kristin Pennington 69 y.o. female is here because of recently diagnosed poorly differentiated carcinoma in her right breast.  She found a lump in her right breast 3 weeks ago, mild tenderness,  No other complains. Last mammo was in oct 2015 which was negative. She feels well overall. She denies any pain, cough, shortness breast, GI symptoms. She has good energy level and appetite, no recent weight loss.  Her last colonoscopy was for 5 years ago which was negative per patient. Her last Pap smear was 3 years ago which was negative per patient.  INTERIM HISTORY: Mrs. Cando returns for follow-up. She is tolerating chemotherapy well. She has mild fatigue, no significant nausea, vomiting, diarrhea, or other complaints. No fever or chills, she is eating well and her weight is stable.  MEDICAL HISTORY:  Past Medical History  Diagnosis Date  . Wears glasses   . Wears partial dentures     top  . 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 Arthritis     knee  . Breast cancer     bilateral, right breast mass, cancer both breast, x1 chemo  a week ago.     SURGICAL HISTORY: Past Surgical History  Procedure Laterality Date  . Appendectomy    . Cholecystectomy    .  Abdominal hysterectomy    . Tonsillectomy    . Colonoscopy    . Portacath placement N/A 06/30/2014    Procedure: INSERTION PORT-A-CATH WITH ULTRA SOUND ON STANDBY;  Surgeon: Fanny Skates, MD;  Location: [facility];  Service: General;  Laterality: N/A;  . Endobronchial ultrasound Bilateral 07/28/2014    Procedure: ENDOBRONCHIAL ULTRASOUND;  Surgeon: Collene Gobble, MD;  Location:  [facility];  Service: Cardiopulmonary;  Laterality: Bilateral;   SOCIAL HISTORY: History   Social History  . Marital Status: Married    Spouse Name: N/A  . Number of Children: N/A  . Years of Education: N/A   Occupational History  . Not on file.   Social History Main Topics  . Smoking status: Former Smoker -- 1.00 packs/day for 18 years    Quit date: 03/28/1988  . Smokeless tobacco: Not on file  . Alcohol Use: No  . Drug Use: No  . Sexual Activity: Not on file   Other Topics Concern  . Not on file   Social History Narrative     GYN HISTORY  Menarchal: 11 LMP: 1994, hysrectomy  Contraceptive: no  HRT: since 43  G2P1, one miscarriage     FAMILY HISTORY: Family History  Problem Relation Age of Onset  . Cancer Father 88    gastric cancer   . Cancer Sister 8    lung cancer     ALLERGIES:  is allergic to aspirin; codeine; diclofenac; meloxicam; nitrofurantoin; other; pyridium; sulfa antibiotics; tetracyclines & related; and tape.  MEDICATIONS:  Current Outpatient Prescriptions  Medication Sig Dispense Refill  . aspirin EC 81 MG tablet Take 81 mg by mouth daily.     . calcium carbonate (OS-CAL) 600 MG TABS tablet Take 600 mg by mouth 2 (two) times daily with a meal.    . cyanocobalamin (,VITAMIN B-12,) 1000 MCG/ML injection Inject 1,000 mcg into the muscle every 30 (thirty) days.     Marland Kitchen HYDROcodone-acetaminophen (NORCO) 5-325 MG per tablet Take 1-2 tablets by mouth every 6 (six) hours as needed for moderate pain or severe pain. 30 tablet 0  . levETIRAcetam (KEPPRA) 500 MG tablet Take 1 tablet by mouth 2 (two) times daily.    Marland Kitchen lidocaine-prilocaine (EMLA) cream Apply 1 application topically as needed. Apply to portacath  1 hour to 1 1/2 hours prior to procedures as needed.   Cover with clear plastic. 30 g 1  . oxaprozin (DAYPRO) 600 MG tablet Take 1 tablet by mouth 2 (two) times daily.    . phenytoin (DILANTIN) 100 MG ER capsule Take 1 tablet by mouth 4 (four)  times daily. On  Sat , Sun, Tues, Wed, and  Thursdays    -  Take   119m  QID.    .Marland Kitchenzolpidem (AMBIEN) 5 MG tablet Take 1 tablet (5 mg total) by mouth at bedtime as needed for sleep. 30 tablet 0   No current facility-administered medications for this visit.    REVIEW OF SYSTEMS:   Constitutional: Denies fevers, chills or abnormal night sweats Eyes: Denies blurriness of vision, double vision or watery eyes Ears, nose, mouth, throat, and face: Denies mucositis or sore throat Respiratory: Denies cough, dyspnea or wheezes Cardiovascular: Denies palpitation, chest discomfort or lower extremity swelling Gastrointestinal:  Denies nausea, heartburn or change in bowel habits Skin: Denies abnormal skin rashes Lymphatics: Denies new lymphadenopathy or easy bruising Neurological:Denies numbness, tingling or new weaknesses Behavioral/Psych: Mood is stable, no new changes  All other systems were reviewed with  the patient and are negative.  PHYSICAL EXAMINATION: ECOG PERFORMANCE STATUS: 0 - Asymptomatic  BP 113/88 mmHg  Pulse 77  Temp(Src) 98.3 F (36.8 C) (Oral)  Resp 18  Ht _0  (1.626 m)  Wt 165 lb 14.4 oz (75.252 kg)  BMI 28.46 kg/m2  SpO2 100%  GENERAL:alert, no distress and comfortable SKIN: skin color, texture, turgor are normal, no rashes or significant lesions EYES: normal, conjunctiva are pink and non-injected, sclera clear OROPHARYNX:no exudate, no erythema and lips, buccal mucosa, and tongue normal  NECK: supple, thyroid normal size, non-tender, without nodularity LYMPH:  no palpable lymphadenopathy in the cervical, axillary or inguinal LUNGS: clear to auscultation and percussion with normal breathing effort HEART: regular rate & rhythm and no murmurs and no lower extremity edema ABDOMEN:abdomen soft, non-tender and normal bowel sounds Musculoskeletal:no cyanosis of digits and no clubbing  PSYCH: alert & oriented x 3 with fluent speech NEURO: no focal motor/sensory  deficits Breasts: Breast inspection showed them to be symmetrical with no nipple discharge. There is a 2X3.5cm (initially 4.5X6.0cm) mass in the 10 clock positionof her right breast, non-tender. Palpation of the left breasts and axilla revealed no obvious mass that I could appreciate.   LABORATORY DATA:  I have reviewed the data as listed CBC Latest Ref Rng 10/24/2014 10/17/2014 10/10/2014  WBC 3.9 - 10.3 10e3/uL 2.6(L) 2.6(L) 3.4(L)  Hemoglobin 11.6 - 15.9 g/dL 9.9(L) 9.5(L) 9.9(L)  Hematocrit 34.8 - 46.6 % 29.1(L) 28.3(L) 29.2(L)  Platelets 145 - 400 10e3/uL 202 163 236    CMP Latest Ref Rng 10/24/2014 10/17/2014 10/10/2014  Glucose 70 - 140 mg/dl 90 113 92  BUN 7.0 - 26.0 mg/dL 13.7 13.7 14.2  Creatinine 0.6 - 1.1 mg/dL 0.6 0.7 0.7  Sodium 136 - 145 mEq/L 138 140 139  Potassium 3.5 - 5.1 mEq/L 4.2 4.0 4.0  Chloride 96 - 112 mmol/L - - -  CO2 22 - 29 mEq/L _1 Calcium 8.4 - 10.4 mg/dL 8.8 8.5 9.2  Total Protein 6.4 - 8.3 g/dL 6.8 6.6 6.8  Total Bilirubin 0.20 - 1.20 mg/dL 0.23 0.24 0.23  Alkaline Phos 40 - 150 U/L 96 92 109  AST 5 - 34 U/L _2 ALT 0 - 55 U/L _3 PATHOLOGY REPORT 06/05/2014 Diagnosis Breast, right, needle core biopsy - INVASIVE MAMMARY CARCINOMA, SEE COMMENT. Microscopic Comment Although the grade of tumor is best assessed at resection, with these biopsies, the invasive tumor is grade III. The tumor expresses cytokeratin AE1/3 immunostain and is negative for S-100 and melanin A immunostains. Breast prognostic studies  Results: Estrogen Receptor: 0%, NEGATIVE Progesterone Receptor: 0%, NEGATIVE Proliferation Marker Ki67: 98% COMMENT: The negative hormone receptor study(ies) in this case have an internal positive control.  HER-2/NEU BY CISH - NEGATIVE. RESULT RATIO OF HER2: CEP 17 SIGNALS 1.31 AVERAGE HER2 COPY NUMBER PER CELL 2.10  Diagnosis Breast, left, needle core biopsy, UOQ - DUCTAL CARCINOMA IN-SITU. - SEE  COMMENT. Microscopic Comment The ductal carcinoma in situ is low grade. The carcinoma cells are positive for e-cadherin and cytokeratin AE1/AE3. Smooth muscle myosin, calponin, and p63 stains highlight the presence of myoepithelial cells. Results: IMMUNOHISTOCHEMICAL AND MORPHOMETRIC ANALYSIS BY THE AUTOMATED CELLULAR IMAGING SYSTEM (ACIS) Estrogen Receptor: 100%, POSITIVE, STRONG STAINING INTENSITY Progesterone Receptor: 28%, POSITIVE, WEAK STAINING INTENSITY  Diagnosis 07/10/2014 Lymph node, needle/core biopsy, Left supraclavicular - BENIGN REACTIVE LYMPH NODE TISSUE. - NO METASTATIC CARCINOMA IDENTIFIED.  Diagnosis 07/28/2014  FINE NEEDLE  ASPIRATION, ENDOSCOPIC, 10R LYMPH NODE(SPECIMEN 1 OF 3 COLLECTED (07/28/14): NO MALIGNANT CELLS IDENTIFIED. MIXED LYMPHOID POPULATION.  RADIOGRAPHIC STUDIES: I have personally reviewed the radiological images as listed and agreed with the findings in the report.  ASSESSMENT & PLAN:  70 year old postmenopausal woman, with past medical history of seizure and arthritis, presented with a palpable mass in the inner upper quadrant of her right breast. Biopsy revealed poorly differentiated carcinoma, triple negative.  1. 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 -The PET scan showed hypermetabolic lymph nodes in the left supraclavicular, left cervical level II, right hilar and a periportal area. Although they are suspicious for distant note metastasis, both left [place] and right hilar node biopsy were negaative, so likely reactive adenopathy. -I discussed the [place] node and right hilar node biopsy which showed benign reactive lymph tissue -we have discussed her case in our breast tumor board, the consensus is proceed with neoadjuvant chemotherapy, followed by surgery and  radiation. -I recommended to start chemotherapy with dose dense Adriamycin and Cytoxan X4, followed by by biweekly TaxolX4, to maximally control her breast cancer. She agrees -She has completed 4 cycles of dose dense AC, however her breast mass has only shunk by 20-30%. Given the suboptimal response to [facility], I recommend the next chemotherapy to be weekly carbo and Taxol, giving her triple negative disease. Potential side effects from [place] and Taxol were discussed with patient, she agrees to proceed. -will repeat PET scan after she finishes chemotherapy -She is tolerating weekly chemotherapy well, we'll continue. Her right breast mass is getting smaller, good clinical response -lab reviewed, mild neutropenia, adequate for treatment. If her [place] gets lower neck week, I may hold carbol and continue Taxol. Neutropenia fever precaution reviewed with her again  Plan: -Week 4 carbo and Taxol today -RTC in one week for next treatment   All questions were answered. The patient knows to call the clinic with any problems, questions or concerns. I spent 20 minutes counseling the patient face to face. The total time spent in the appointment was 30 minutes and more than 50% was on counseling.     Truitt Merle, MD 10/24/2014 12:15 PM

## 2014-10-24 NOTE — Telephone Encounter (Signed)
Added appt per pof....KJ gave print out to pt

## 2014-10-24 NOTE — Progress Notes (Signed)
OK to treat with ANC-1.3 and WBC-2.6 per Dr. Burr Medico.

## 2014-10-27 ENCOUNTER — Telehealth: Payer: Self-pay | Admitting: *Deleted

## 2014-10-27 NOTE — Telephone Encounter (Signed)
Pt called and left message informed nurse re:  For past few days, pt has noted bloody mucus every time pt blows her nose - bright red blood.  Stated she had 1 episode of bloody mucus on Fri 7/29 before chemo, pt did not think to tell infusion nurse.  Pt also noted some old dry blood mixed with fresh blood when blowing her nose.  Denied any bleeding elsewhere, denied chest congestion, denied coughing.  Dr. Burr Medico notified. Pt's  Phone    [phone].

## 2014-10-27 NOTE — Telephone Encounter (Signed)
Spoke with pt again and informed pt re: Per Dr. Burr Medico, platelet counts normal pre chemo on 10/24/14.  Instructed pt to use normal saline spray to nostrils to keep it moist.  Instructed pt to go to ER if bleeding worsened and/or if pt noted blood dripping down when sitting still.  Confirmed office visit appt with md on 10/31/14.  Pt voiced understanding.

## 2014-10-31 ENCOUNTER — Ambulatory Visit (HOSPITAL_BASED_OUTPATIENT_CLINIC_OR_DEPARTMENT_OTHER): Payer: Medicare Other

## 2014-10-31 ENCOUNTER — Ambulatory Visit: Payer: Medicare Other

## 2014-10-31 ENCOUNTER — Other Ambulatory Visit: Payer: Medicare Other

## 2014-10-31 ENCOUNTER — Ambulatory Visit (HOSPITAL_BASED_OUTPATIENT_CLINIC_OR_DEPARTMENT_OTHER): Payer: Medicare Other | Admitting: Hematology

## 2014-10-31 ENCOUNTER — Encounter: Payer: Self-pay | Admitting: Hematology

## 2014-10-31 ENCOUNTER — Other Ambulatory Visit (HOSPITAL_BASED_OUTPATIENT_CLINIC_OR_DEPARTMENT_OTHER): Payer: Medicare Other

## 2014-10-31 VITALS — BP 128/70 | HR 74 | Temp 98.5°F | Resp 18 | Ht 64.0 in | Wt 168.1 lb

## 2014-10-31 DIAGNOSIS — D709 Neutropenia, unspecified: Secondary | ICD-10-CM

## 2014-10-31 DIAGNOSIS — Z5111 Encounter for antineoplastic chemotherapy: Secondary | ICD-10-CM | POA: Diagnosis present

## 2014-10-31 DIAGNOSIS — C50211 Malignant neoplasm of upper-inner quadrant of right female breast: Secondary | ICD-10-CM

## 2014-10-31 LAB — CBC WITH DIFFERENTIAL/PLATELET
BASO%: 2.6 % — ABNORMAL HIGH (ref 0.0–2.0)
Basophils Absolute: 0.1 10*3/uL (ref 0.0–0.1)
EOS%: 3.4 % (ref 0.0–7.0)
Eosinophils Absolute: 0.1 10*3/uL (ref 0.0–0.5)
HCT: 30.4 % — ABNORMAL LOW (ref 34.8–46.6)
HGB: 10.3 g/dL — ABNORMAL LOW (ref 11.6–15.9)
LYMPH%: 30 % (ref 14.0–49.7)
MCH: 35.1 pg — ABNORMAL HIGH (ref 25.1–34.0)
MCHC: 33.9 g/dL (ref 31.5–36.0)
MCV: 103.4 fL — ABNORMAL HIGH (ref 79.5–101.0)
MONO#: 0.3 10*3/uL (ref 0.1–0.9)
MONO%: 14.8 % — ABNORMAL HIGH (ref 0.0–14.0)
NEUT#: 1 10*3/uL — ABNORMAL LOW (ref 1.5–6.5)
NEUT%: 49.2 % (ref 38.4–76.8)
Platelets: 150 10*3/uL (ref 145–400)
RBC: 2.93 10*6/uL — ABNORMAL LOW (ref 3.70–5.45)
RDW: 17.1 % — ABNORMAL HIGH (ref 11.2–14.5)
WBC: 2.1 10*3/uL — ABNORMAL LOW (ref 3.9–10.3)
lymph#: 0.6 10*3/uL — ABNORMAL LOW (ref 0.9–3.3)

## 2014-10-31 LAB — COMPREHENSIVE METABOLIC PANEL (CC13)
ALT: 15 U/L (ref 0–55)
AST: 16 U/L (ref 5–34)
Albumin: 3.5 g/dL (ref 3.5–5.0)
Alkaline Phosphatase: 86 U/L (ref 40–150)
Anion Gap: 5 mEq/L (ref 3–11)
BUN: 15.2 mg/dL (ref 7.0–26.0)
CO2: 27 mEq/L (ref 22–29)
Calcium: 8.7 mg/dL (ref 8.4–10.4)
Chloride: 107 mEq/L (ref 98–109)
Creatinine: 0.8 mg/dL (ref 0.6–1.1)
EGFR: 81 mL/min/{1.73_m2} — ABNORMAL LOW (ref >90)
Glucose: 85 mg/dl (ref 70–140)
Potassium: 3.9 mEq/L (ref 3.5–5.1)
Sodium: 139 mEq/L (ref 136–145)
Total Bilirubin: 0.23 mg/dL (ref 0.20–1.20)
Total Protein: 6.7 g/dL (ref 6.4–8.3)

## 2014-10-31 MED ORDER — DIPHENHYDRAMINE HCL 50 MG/ML IJ SOLN
25.0000 mg | Freq: Once | INTRAMUSCULAR | Status: AC
  Administered 2014-10-31: 25 mg via INTRAVENOUS

## 2014-10-31 MED ORDER — SODIUM CHLORIDE 0.9 % IJ SOLN
10.0000 mL | INTRAMUSCULAR | Status: DC | PRN
  Administered 2014-10-31: 10 mL
  Filled 2014-10-31: qty 10

## 2014-10-31 MED ORDER — FAMOTIDINE IN NACL 20-0.9 MG/50ML-% IV SOLN
20.0000 mg | Freq: Once | INTRAVENOUS | Status: AC
  Administered 2014-10-31: 20 mg via INTRAVENOUS

## 2014-10-31 MED ORDER — SODIUM CHLORIDE 0.9 % IV SOLN
Freq: Once | INTRAVENOUS | Status: AC
  Administered 2014-10-31: 10:00:00 via INTRAVENOUS
  Filled 2014-10-31: qty 8

## 2014-10-31 MED ORDER — HEPARIN SOD (PORK) LOCK FLUSH 100 UNIT/ML IV SOLN
500.0000 [IU] | Freq: Once | INTRAVENOUS | Status: AC | PRN
  Administered 2014-10-31: 500 [IU]
  Filled 2014-10-31: qty 5

## 2014-10-31 MED ORDER — PACLITAXEL CHEMO INJECTION 300 MG/50ML
80.0000 mg/m2 | Freq: Once | INTRAVENOUS | Status: AC
  Administered 2014-10-31: 144 mg via INTRAVENOUS
  Filled 2014-10-31: qty 24

## 2014-10-31 MED ORDER — FAMOTIDINE IN NACL 20-0.9 MG/50ML-% IV SOLN
INTRAVENOUS | Status: AC
  Filled 2014-10-31: qty 50

## 2014-10-31 MED ORDER — SODIUM CHLORIDE 0.9 % IV SOLN
Freq: Once | INTRAVENOUS | Status: AC
  Administered 2014-10-31: 10:00:00 via INTRAVENOUS

## 2014-10-31 MED ORDER — DIPHENHYDRAMINE HCL 50 MG/ML IJ SOLN
INTRAMUSCULAR | Status: AC
  Filled 2014-10-31: qty 1

## 2014-10-31 NOTE — Progress Notes (Signed)
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 Ultrasound of the right axilla is negative   06/05/2014 Pathology Results INVASIVE MAMMARY CARCINOMA   06/05/2014 Receptors her2 Estrogen Receptor: 0%, NEGATIVE Progesterone Receptor: 0%, NEGATIVE Proliferation Marker Ki67: 98%  HER-2/NEU BY CISH - NEGATIVE   06/11/2014 Initial Diagnosis Breast cancer of upper-inner quadrant of right female breast   07/02/2014 PET scan 1. Intensely hypermetabolic mass in the medial RIGHT chest wall with hypermetabolic right axillary node 2. Hypermetabolic nodal metastasis in the LEFT level 2 and LEFT supraclavicular and periportal node (SUV 5).    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   07/28/2014 Pathology Results Right hilar and are lymph node biopsy showed no malignant cells, mixed lymphoid population.   08/11/2014 Miscellaneous nadir CBC: WBC 0.54, hemoglobin 8.9, hematocrit 26%, platelet 125    HISTORY OF PRESENTING ILLNESS:  Kristin Pennington 69 y.o. female is here because of recently diagnosed poorly differentiated carcinoma in her right breast.  She found a lump in her right breast 3 weeks ago, mild tenderness,  No other complains. Last mammo was in oct 2015 which was negative. She feels well overall. She denies any pain, cough, shortness breast, GI symptoms. She has good energy level and appetite, no recent weight loss.  Her last colonoscopy was for 5 years ago which was negative per patient. Her last Pap smear was 3 years ago which was negative per patient.  INTERIM HISTORY: Kristin Pennington returns for follow-up and cycle 5 chemotherapy .  She is doing well overall. She noticed some mild bleeding when she blows her nose,  Improved after using saline.  She otherwise been tolerating chemotherapy very well, mild fatigue, function well at home. No significant nausea, diarrhea, pain, or other symptoms. No fever or chills.  MEDICAL HISTORY:  Past Medical History  Diagnosis Date  . Wears glasses   . Wears partial dentures     top  . 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 Arthritis     knee  . Breast cancer     bilateral, right breast mass, cancer both breast, x1 chemo  a week ago.  SURGICAL HISTORY: Past Surgical History  Procedure Laterality Date  . Appendectomy    . Cholecystectomy    . Abdominal hysterectomy    . Tonsillectomy    . Colonoscopy    . Portacath placement N/A 06/30/2014    Procedure: INSERTION PORT-A-CATH WITH ULTRA SOUND ON STANDBY;  Surgeon: Fanny Skates, MD;  Location: [facility];  Service: General;  Laterality: N/A;  .  Endobronchial ultrasound Bilateral 07/28/2014    Procedure: ENDOBRONCHIAL ULTRASOUND;  Surgeon: Collene Gobble, MD;  Location: [facility];  Service: Cardiopulmonary;  Laterality: Bilateral;   SOCIAL HISTORY: History   Social History  . Marital Status: Married    Spouse Name: N/A  . Number of Children: N/A  . Years of Education: N/A   Occupational History  . Not on file.   Social History Main Topics  . Smoking status: Former Smoker -- 1.00 packs/day for 18 years    Quit date: 03/28/1988  . Smokeless tobacco: Not on file  . Alcohol Use: No  . Drug Use: No  . Sexual Activity: Not on file   Other Topics Concern  . Not on file   Social History Narrative     GYN HISTORY  Menarchal: 11 LMP: 1994, hysrectomy  Contraceptive: no  HRT: since 32  G2P1, one miscarriage     FAMILY HISTORY: Family History  Problem Relation Age of Onset  . Cancer Father 68    gastric cancer   . Cancer Sister 41    lung cancer     ALLERGIES:  is allergic to aspirin; codeine; diclofenac; meloxicam; nitrofurantoin; other; pyridium; sulfa antibiotics; tetracyclines & related; and tape.  MEDICATIONS:  Current Outpatient Prescriptions  Medication Sig Dispense Refill  . aspirin EC 81 MG tablet Take 81 mg by mouth daily.     . calcium carbonate (OS-CAL) 600 MG TABS tablet Take 600 mg by mouth 2 (two) times daily with a meal.    . cyanocobalamin (,VITAMIN B-12,) 1000 MCG/ML injection Inject 1,000 mcg into the muscle every 30 (thirty) days.     Marland Kitchen HYDROcodone-acetaminophen (NORCO) 5-325 MG per tablet Take 1-2 tablets by mouth every 6 (six) hours as needed for moderate pain or severe pain. 30 tablet 0  . levETIRAcetam (KEPPRA) 500 MG tablet Take 1 tablet by mouth 2 (two) times daily.    Marland Kitchen lidocaine-prilocaine (EMLA) cream Apply 1 application topically as needed. Apply to portacath  1 hour to 1 1/2 hours prior to procedures as needed.   Cover with clear plastic. 30 g 1  . oxaprozin (DAYPRO) 600 MG  tablet Take 1 tablet by mouth 2 (two) times daily.    . phenytoin (DILANTIN) 100 MG ER capsule Take 1 tablet by mouth 4 (four) times daily. On  Sat , Sun, Tues, Wed, and  Thursdays    -  Take   14m  QID.    .Marland Kitchensodium chloride (OCEAN) 0.65 % SOLN nasal spray Place 1 spray into both nostrils as needed for congestion.    .Marland Kitchenzolpidem (AMBIEN) 5 MG tablet Take 1 tablet (5 mg total) by mouth at bedtime as needed for sleep. 30 tablet 0   No current facility-administered medications for this visit.    REVIEW OF SYSTEMS:   Constitutional: Denies fevers, chills or abnormal night sweats Eyes: Denies blurriness of vision, double vision or watery eyes Ears, nose, mouth, throat, and face: Denies mucositis or sore throat Respiratory: Denies cough, dyspnea or wheezes Cardiovascular: Denies palpitation, chest discomfort or lower extremity  swelling Gastrointestinal:  Denies nausea, heartburn or change in bowel habits Skin: Denies abnormal skin rashes Lymphatics: Denies new lymphadenopathy or easy bruising Neurological:Denies numbness, tingling or new weaknesses Behavioral/Psych: Mood is stable, no new changes  All other systems were reviewed with the patient and are negative.  PHYSICAL EXAMINATION: ECOG PERFORMANCE STATUS: 1  BP 128/70 mmHg  Pulse 74  Temp(Src) 98.5 F (36.9 C) (Oral)  Resp 18  Ht '5\' 4"'  (1.626 m)  Wt 168 lb 1.6 oz (76.25 kg)  BMI 28.84 kg/m2  SpO2 99%  GENERAL:alert, no distress and comfortable SKIN: skin color, texture, turgor are normal, no rashes or significant lesions EYES: normal, conjunctiva are pink and non-injected, sclera clear OROPHARYNX:no exudate, no erythema and lips, buccal mucosa, and tongue normal  NECK: supple, thyroid normal size, non-tender, without nodularity LYMPH:  no palpable lymphadenopathy in the cervical, axillary or inguinal LUNGS: clear to auscultation and percussion with normal breathing effort HEART: regular rate & rhythm and no murmurs and no lower  extremity edema ABDOMEN:abdomen soft, non-tender and normal bowel sounds Musculoskeletal:no cyanosis of digits and no clubbing  PSYCH: alert & oriented x 3 with fluent speech NEURO: no focal motor/sensory deficits Breasts: Breast inspection showed them to be symmetrical with no nipple discharge. There is a 1.8X3.2cm (initially 4.5X6.0cm) mass in the 10 clock positionof her right breast, non-tender. Palpation of the left breasts and axilla revealed no obvious mass that I could appreciate.   weight on LABORATORY DATA:  I have reviewed the data as listed CBC Latest Ref Rng 10/31/2014 10/24/2014 10/17/2014  WBC 3.9 - 10.3 10e3/uL 2.1(L) 2.6(L) 2.6(L)  Hemoglobin 11.6 - 15.9 g/dL 10.3(L) 9.9(L) 9.5(L)  Hematocrit 34.8 - 46.6 % 30.4(L) 29.1(L) 28.3(L)  Platelets 145 - 400 10e3/uL 150 202 163    CMP Latest Ref Rng 10/24/2014 10/17/2014 10/10/2014  Glucose 70 - 140 mg/dl 90 113 92  BUN 7.0 - 26.0 mg/dL 13.7 13.7 14.2  Creatinine 0.6 - 1.1 mg/dL 0.6 0.7 0.7  Sodium 136 - 145 mEq/L 138 140 139  Potassium 3.5 - 5.1 mEq/L 4.2 4.0 4.0  Chloride 96 - 112 mmol/L - - -  CO2 22 - 29 mEq/L '26 28 27  ' Calcium 8.4 - 10.4 mg/dL 8.8 8.5 9.2  Total Protein 6.4 - 8.3 g/dL 6.8 6.6 6.8  Total Bilirubin 0.20 - 1.20 mg/dL 0.23 0.24 0.23  Alkaline Phos 40 - 150 U/L 96 92 109  AST 5 - 34 U/L '16 17 16  ' ALT 0 - 55 U/L '13 13 15      ' PATHOLOGY REPORT 06/05/2014 Diagnosis Breast, right, needle core biopsy - INVASIVE MAMMARY CARCINOMA, SEE COMMENT. Microscopic Comment Although the grade of tumor is best assessed at resection, with these biopsies, the invasive tumor is grade III. The tumor expresses cytokeratin AE1/3 immunostain and is negative for S-100 and melanin A immunostains. Breast prognostic studies  Results: Estrogen Receptor: 0%, NEGATIVE Progesterone Receptor: 0%, NEGATIVE Proliferation Marker Ki67: 98% COMMENT: The negative hormone receptor study(ies) in this case have an internal positive  control.  HER-2/NEU BY CISH - NEGATIVE. RESULT RATIO OF HER2: CEP 17 SIGNALS 1.31 AVERAGE HER2 COPY NUMBER PER CELL 2.10  Diagnosis Breast, left, needle core biopsy, UOQ - DUCTAL CARCINOMA IN-SITU. - SEE COMMENT. Microscopic Comment The ductal carcinoma in situ is low grade. The carcinoma cells are positive for e-cadherin and cytokeratin AE1/AE3. Smooth muscle myosin, calponin, and p63 stains highlight the presence of myoepithelial cells. Results: IMMUNOHISTOCHEMICAL AND MORPHOMETRIC ANALYSIS BY THE AUTOMATED CELLULAR  IMAGING SYSTEM (ACIS) Estrogen Receptor: 100%, POSITIVE, STRONG STAINING INTENSITY Progesterone Receptor: 28%, POSITIVE, WEAK STAINING INTENSITY  Diagnosis 07/10/2014 Lymph node, needle/core biopsy, Left supraclavicular - BENIGN REACTIVE LYMPH NODE TISSUE. - NO METASTATIC CARCINOMA IDENTIFIED.  Diagnosis 07/28/2014  FINE NEEDLE ASPIRATION, ENDOSCOPIC, 10R LYMPH NODE(SPECIMEN 1 OF 3 COLLECTED (07/28/14): NO MALIGNANT CELLS IDENTIFIED. MIXED LYMPHOID POPULATION.  RADIOGRAPHIC STUDIES: I have personally reviewed the radiological images as listed and agreed with the findings in the report.  ASSESSMENT & PLAN:  69 year old postmenopausal woman, with past medical history of seizure and arthritis, presented with a palpable mass in the inner upper quadrant of her right breast. Biopsy revealed poorly differentiated carcinoma, triple negative.  1. 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 -The PET scan showed hypermetabolic lymph nodes in the left supraclavicular, left cervical level II, right hilar and a periportal area. Although they are suspicious for distant note metastasis, both left [place] and right hilar node biopsy were negaative, so likely reactive adenopathy. -I discussed the [place] node  and right hilar node biopsy which showed benign reactive lymph tissue -we have discussed her case in our breast tumor board, the consensus is proceed with neoadjuvant chemotherapy, followed by surgery and radiation. -I recommended to start chemotherapy with dose dense Adriamycin and Cytoxan X4, followed by by biweekly TaxolX4, to maximally control her breast cancer. She agrees -She has completed 4 cycles of dose dense AC, however her breast mass has only shunk by 20-30%. Given the suboptimal response to [facility], I recommend the next chemotherapy to be weekly carbo and Taxol, giving her triple negative disease. Potential side effects from [place] and Taxol were discussed with patient, she agrees to proceed. -will repeat PET scan after she finishes chemotherapy -She is tolerating weekly chemotherapy well, we'll continue. Her right breast mass is getting smaller, good clinical response -lab reviewed, mild neutropenia,  With ANC 1.0 today, gradually trending down, we'll hold carboplatin today proceeded with Taxol alone. Neutropenia fever precaution reviewed with her again  Plan: -Week 5 chemo with Taxol alone today,  Hold on carboplatin due to the neutropenia - if her [place] recovers, or I'll add  Carboplatin back - continue weekly treatment, I'll see her back in 2 weeks.  All questions were answered. The patient knows to call the clinic with any problems, questions or concerns. I spent 20 minutes counseling the patient face to face. The total time spent in the appointment was 30 minutes and more than 50% was on counseling.     Truitt Merle, MD 10/31/2014 8:59 AM

## 2014-10-31 NOTE — Patient Instructions (Signed)
[facility] Discharge Instructions for Patients Receiving Chemotherapy  Today you received the following chemotherapy agents Taxol   To help prevent nausea and vomiting after your treatment, we encourage you to take your nausea medication as directed.   If you develop nausea and vomiting that is not controlled by your nausea medication, call the clinic.   BELOW ARE SYMPTOMS THAT SHOULD BE REPORTED IMMEDIATELY:  *FEVER GREATER THAN 100.5 F  *CHILLS WITH OR WITHOUT FEVER  NAUSEA AND VOMITING THAT IS NOT CONTROLLED WITH YOUR NAUSEA MEDICATION  *UNUSUAL SHORTNESS OF BREATH  *UNUSUAL BRUISING OR BLEEDING  TENDERNESS IN MOUTH AND THROAT WITH OR WITHOUT PRESENCE OF ULCERS  *URINARY PROBLEMS  *BOWEL PROBLEMS  UNUSUAL RASH Items with * indicate a potential emergency and should be followed up as soon as possible.  Feel free to call the clinic you have any questions or concerns. The clinic phone number is [phone].  Please show the CHEMO ALERT CARD at check-in to the Emergency Department and triage nurse.   

## 2014-10-31 NOTE — Progress Notes (Signed)
Per Janifer Adie, Dr. Ernestina Penna RN, okay to treat today with ANC: 1.0 with Taxol only. Carbo will be held today.

## 2014-10-31 NOTE — Progress Notes (Signed)
Labs faxed to [employer] per request of Patient.

## 2014-11-06 ENCOUNTER — Telehealth: Payer: Self-pay | Admitting: Hematology

## 2014-11-06 NOTE — Telephone Encounter (Signed)
per staff message from Dr Burr Medico to move pt to her sch from [facility] @ same time-pt moved-notified Dr Burr Medico

## 2014-11-07 ENCOUNTER — Telehealth: Payer: Self-pay | Admitting: Hematology

## 2014-11-07 ENCOUNTER — Encounter: Payer: Self-pay | Admitting: Hematology

## 2014-11-07 ENCOUNTER — Telehealth: Payer: Self-pay | Admitting: *Deleted

## 2014-11-07 ENCOUNTER — Other Ambulatory Visit (HOSPITAL_BASED_OUTPATIENT_CLINIC_OR_DEPARTMENT_OTHER): Payer: Medicare Other

## 2014-11-07 ENCOUNTER — Ambulatory Visit (HOSPITAL_BASED_OUTPATIENT_CLINIC_OR_DEPARTMENT_OTHER): Payer: Medicare Other | Admitting: Hematology

## 2014-11-07 ENCOUNTER — Ambulatory Visit: Payer: Medicare Other | Admitting: Nurse Practitioner

## 2014-11-07 ENCOUNTER — Ambulatory Visit (HOSPITAL_BASED_OUTPATIENT_CLINIC_OR_DEPARTMENT_OTHER): Payer: Medicare Other

## 2014-11-07 VITALS — BP 124/72 | HR 78 | Temp 98.0°F | Resp 18 | Ht 64.0 in | Wt 165.6 lb

## 2014-11-07 DIAGNOSIS — C50211 Malignant neoplasm of upper-inner quadrant of right female breast: Secondary | ICD-10-CM | POA: Diagnosis not present

## 2014-11-07 DIAGNOSIS — R591 Generalized enlarged lymph nodes: Secondary | ICD-10-CM

## 2014-11-07 DIAGNOSIS — Z5111 Encounter for antineoplastic chemotherapy: Secondary | ICD-10-CM | POA: Diagnosis present

## 2014-11-07 LAB — CBC WITH DIFFERENTIAL/PLATELET
BASO%: 2.1 % — ABNORMAL HIGH (ref 0.0–2.0)
Basophils Absolute: 0.1 10*3/uL (ref 0.0–0.1)
EOS%: 3.8 % (ref 0.0–7.0)
Eosinophils Absolute: 0.1 10*3/uL (ref 0.0–0.5)
HCT: 31.2 % — ABNORMAL LOW (ref 34.8–46.6)
HGB: 10.7 g/dL — ABNORMAL LOW (ref 11.6–15.9)
LYMPH%: 30.5 % (ref 14.0–49.7)
MCH: 35.4 pg — ABNORMAL HIGH (ref 25.1–34.0)
MCHC: 34.2 g/dL (ref 31.5–36.0)
MCV: 103.8 fL — ABNORMAL HIGH (ref 79.5–101.0)
MONO#: 0.3 10*3/uL (ref 0.1–0.9)
MONO%: 13.6 % (ref 0.0–14.0)
NEUT#: 1.3 10*3/uL — ABNORMAL LOW (ref 1.5–6.5)
NEUT%: 50 % (ref 38.4–76.8)
Platelets: 142 10*3/uL — ABNORMAL LOW (ref 145–400)
RBC: 3.01 10*6/uL — ABNORMAL LOW (ref 3.70–5.45)
RDW: 17.3 % — ABNORMAL HIGH (ref 11.2–14.5)
WBC: 2.5 10*3/uL — ABNORMAL LOW (ref 3.9–10.3)
lymph#: 0.8 10*3/uL — ABNORMAL LOW (ref 0.9–3.3)

## 2014-11-07 LAB — COMPREHENSIVE METABOLIC PANEL (CC13)
ALT: 13 U/L (ref 0–55)
AST: 15 U/L (ref 5–34)
Albumin: 3.6 g/dL (ref 3.5–5.0)
Alkaline Phosphatase: 105 U/L (ref 40–150)
Anion Gap: 8 mEq/L (ref 3–11)
BUN: 15.4 mg/dL (ref 7.0–26.0)
CO2: 26 mEq/L (ref 22–29)
Calcium: 8.7 mg/dL (ref 8.4–10.4)
Chloride: 104 mEq/L (ref 98–109)
Creatinine: 0.7 mg/dL (ref 0.6–1.1)
EGFR: 88 mL/min/{1.73_m2} — ABNORMAL LOW (ref >90)
Glucose: 84 mg/dl (ref 70–140)
Potassium: 3.8 mEq/L (ref 3.5–5.1)
Sodium: 138 mEq/L (ref 136–145)
Total Bilirubin: 0.21 mg/dL (ref 0.20–1.20)
Total Protein: 6.8 g/dL (ref 6.4–8.3)

## 2014-11-07 MED ORDER — FAMOTIDINE IN NACL 20-0.9 MG/50ML-% IV SOLN
INTRAVENOUS | Status: AC
  Filled 2014-11-07: qty 50

## 2014-11-07 MED ORDER — SODIUM CHLORIDE 0.9 % IV SOLN
Freq: Once | INTRAVENOUS | Status: AC
  Administered 2014-11-07: 12:00:00 via INTRAVENOUS
  Filled 2014-11-07: qty 8

## 2014-11-07 MED ORDER — DIPHENHYDRAMINE HCL 50 MG/ML IJ SOLN
25.0000 mg | Freq: Once | INTRAMUSCULAR | Status: AC
  Administered 2014-11-07: 25 mg via INTRAVENOUS

## 2014-11-07 MED ORDER — SODIUM CHLORIDE 0.9 % IJ SOLN
10.0000 mL | INTRAMUSCULAR | Status: DC | PRN
  Administered 2014-11-07: 10 mL
  Filled 2014-11-07: qty 10

## 2014-11-07 MED ORDER — FAMOTIDINE IN NACL 20-0.9 MG/50ML-% IV SOLN
20.0000 mg | Freq: Once | INTRAVENOUS | Status: AC
  Administered 2014-11-07: 20 mg via INTRAVENOUS

## 2014-11-07 MED ORDER — HEPARIN SOD (PORK) LOCK FLUSH 100 UNIT/ML IV SOLN
500.0000 [IU] | Freq: Once | INTRAVENOUS | Status: AC | PRN
  Administered 2014-11-07: 500 [IU]
  Filled 2014-11-07: qty 5

## 2014-11-07 MED ORDER — PACLITAXEL CHEMO INJECTION 300 MG/50ML
80.0000 mg/m2 | Freq: Once | INTRAVENOUS | Status: AC
  Administered 2014-11-07: 144 mg via INTRAVENOUS
  Filled 2014-11-07: qty 24

## 2014-11-07 MED ORDER — SODIUM CHLORIDE 0.9 % IV SOLN
Freq: Once | INTRAVENOUS | Status: AC
  Administered 2014-11-07: 11:00:00 via INTRAVENOUS

## 2014-11-07 MED ORDER — SODIUM CHLORIDE 0.9 % IV SOLN
171.2000 mg | Freq: Once | INTRAVENOUS | Status: AC
  Administered 2014-11-07: 170 mg via INTRAVENOUS
  Filled 2014-11-07: qty 17

## 2014-11-07 NOTE — Progress Notes (Signed)
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 Ultrasound of the right axilla is negative   06/05/2014 Pathology Results INVASIVE MAMMARY CARCINOMA   06/05/2014 Receptors her2 Estrogen Receptor: 0%, NEGATIVE Progesterone Receptor: 0%, NEGATIVE Proliferation Marker Ki67: 98%  HER-2/NEU BY CISH - NEGATIVE   06/11/2014 Initial Diagnosis Breast cancer of upper-inner quadrant of right female breast   07/02/2014 PET scan 1. Intensely hypermetabolic mass in the medial RIGHT chest wall with hypermetabolic right axillary node 2. Hypermetabolic nodal metastasis in the LEFT level 2 and LEFT supraclavicular and periportal node (SUV 5).    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   07/28/2014 Pathology Results Right hilar and are lymph node biopsy showed no malignant cells, mixed lymphoid population.   08/11/2014 Miscellaneous nadir CBC: WBC 0.54, hemoglobin 8.9, hematocrit 26%, platelet 125    HISTORY OF PRESENTING ILLNESS:  Kristin Pennington 69 y.o. female is here because of recently diagnosed poorly differentiated carcinoma in her right breast.  She found a lump in her right breast 3 weeks ago, mild tenderness,  No other complains. Last mammo was in oct 2015 which was negative. She feels well overall. She denies any pain, cough, shortness breast, GI symptoms. She has good energy level and appetite, no recent weight loss.  Her last colonoscopy was for 5 years ago which was negative per patient. Her last Pap smear was 3 years ago which was negative per patient.  INTERIM HISTORY: Kristin Pennington returns for follow-up and cycle 6 chemotherapy .  She has been tolerating the treatment very well, mild fatigue, able tolerate routine activity without difficulty. No fever or chills. She is eating well, no nausea, pain, abdominal discomfort, change of her bowel habits or other new symptoms.   MEDICAL HISTORY:  Past Medical History  Diagnosis Date  . Wears glasses   . Wears partial dentures     top  . 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 Arthritis     knee  . Breast cancer     bilateral, right breast mass, cancer both breast, x1 chemo  a week ago.     SURGICAL HISTORY: Past Surgical History  Procedure Laterality Date  . Appendectomy    . Cholecystectomy    . Abdominal hysterectomy    . Tonsillectomy    . Colonoscopy    . Portacath placement N/A 06/30/2014    Procedure: INSERTION PORT-A-CATH WITH ULTRA SOUND ON STANDBY;  Surgeon: Fanny Skates, MD;  Location: [facility];  Service: General;  Laterality: N/A;  . Endobronchial ultrasound Bilateral 07/28/2014     Procedure: ENDOBRONCHIAL ULTRASOUND;  Surgeon: Collene Gobble, MD;  Location: [facility];  Service: Cardiopulmonary;  Laterality: Bilateral;   SOCIAL HISTORY: Social History   Social History  . Marital Status: Married    Spouse Name: N/A  . Number of Children: N/A  . Years of Education: N/A   Occupational History  . Not on file.   Social History Main Topics  . Smoking status: Former Smoker -- 1.00 packs/day for 18 years    Quit date: 03/28/1988  . Smokeless tobacco: Not on file  . Alcohol Use: No  . Drug Use: No  . Sexual Activity: Not on file   Other Topics Concern  . Not on file   Social History Narrative     GYN HISTORY  Menarchal: 11 LMP: 1994, hysrectomy  Contraceptive: no  HRT: since 1  G2P1, one miscarriage     FAMILY HISTORY: Family History  Problem Relation Age of Onset  . Cancer Father 40    gastric cancer   . Cancer Sister 23    lung cancer     ALLERGIES:  is allergic to aspirin; codeine; diclofenac; meloxicam; nitrofurantoin; other; pyridium; sulfa antibiotics; tetracyclines & related; and tape.  MEDICATIONS:  Current Outpatient Prescriptions  Medication Sig Dispense Refill  . aspirin EC 81 MG tablet Take 81 mg by mouth daily.     . calcium carbonate (OS-CAL) 600 MG TABS tablet Take 600 mg by mouth 2 (two) times daily with a meal.    . cyanocobalamin (,VITAMIN B-12,) 1000 MCG/ML injection Inject 1,000 mcg into the muscle every 30 (thirty) days.     Marland Kitchen HYDROcodone-acetaminophen (NORCO) 5-325 MG per tablet Take 1-2 tablets by mouth every 6 (six) hours as needed for moderate pain or severe pain. 30 tablet 0  . levETIRAcetam (KEPPRA) 500 MG tablet Take 1 tablet by mouth 2 (two) times daily.    Marland Kitchen lidocaine-prilocaine (EMLA) cream Apply 1 application topically as needed. Apply to portacath  1 hour to 1 1/2 hours prior to procedures as needed.   Cover with clear plastic. 30 g 1  . oxaprozin (DAYPRO) 600 MG tablet Take 1 tablet by mouth 2 (two)  times daily.    . phenytoin (DILANTIN) 100 MG ER capsule Take 1 tablet by mouth 4 (four) times daily. On  Sat , Sun, Tues, Wed, and  Thursdays    -  Take   128m  QID.    .Marland Kitchensodium chloride (OCEAN) 0.65 % SOLN nasal spray Place 1 spray into both nostrils as needed for congestion.    .Marland Kitchenzolpidem (AMBIEN) 5 MG tablet Take 1 tablet (5 mg total) by mouth at bedtime as needed for sleep. 30 tablet 0   No current facility-administered medications for this visit.    REVIEW OF SYSTEMS:   Constitutional: Denies fevers, chills or abnormal night sweats Eyes: Denies blurriness of vision, double vision or watery eyes Ears, nose, mouth, throat, and face: Denies mucositis or sore throat Respiratory: Denies cough, dyspnea or wheezes Cardiovascular: Denies palpitation, chest discomfort or lower extremity swelling Gastrointestinal:  Denies nausea,  heartburn or change in bowel habits Skin: Denies abnormal skin rashes Lymphatics: Denies new lymphadenopathy or easy bruising Neurological:Denies numbness, tingling or new weaknesses Behavioral/Psych: Mood is stable, no new changes  All other systems were reviewed with the patient and are negative.  PHYSICAL EXAMINATION: ECOG PERFORMANCE STATUS: 1  There were no vitals taken for this visit.  GENERAL:alert, no distress and comfortable SKIN: skin color, texture, turgor are normal, no rashes or significant lesions EYES: normal, conjunctiva are pink and non-injected, sclera clear OROPHARYNX:no exudate, no erythema and lips, buccal mucosa, and tongue normal  NECK: supple, thyroid normal size, non-tender, without nodularity LYMPH:  no palpable lymphadenopathy in the cervical, axillary or inguinal LUNGS: clear to auscultation and percussion with normal breathing effort HEART: regular rate & rhythm and no murmurs and no lower extremity edema ABDOMEN:abdomen soft, non-tender and normal bowel sounds Musculoskeletal:no cyanosis of digits and no clubbing  PSYCH: alert &  oriented x 3 with fluent speech NEURO: no focal motor/sensory deficits Breasts: Breast inspection showed them to be symmetrical with no nipple discharge. There is a 1.5X3.0cm (initially 4.5X6.0cm) mass in the 10 clock positionof her right breast, non-tender. Palpation of the left breasts and axilla revealed no obvious mass that I could appreciate.   weight on LABORATORY DATA:  I have reviewed the data as listed CBC Latest Ref Rng 10/31/2014 10/24/2014 10/17/2014  WBC 3.9 - 10.3 10e3/uL 2.1(L) 2.6(L) 2.6(L)  Hemoglobin 11.6 - 15.9 g/dL 10.3(L) 9.9(L) 9.5(L)  Hematocrit 34.8 - 46.6 % 30.4(L) 29.1(L) 28.3(L)  Platelets 145 - 400 10e3/uL 150 202 163    CMP Latest Ref Rng 10/31/2014 10/24/2014 10/17/2014  Glucose 70 - 140 mg/dl 85 90 113  BUN 7.0 - 26.0 mg/dL 15.2 13.7 13.7  Creatinine 0.6 - 1.1 mg/dL 0.8 0.6 0.7  Sodium 136 - 145 mEq/L 139 138 140  Potassium 3.5 - 5.1 mEq/L 3.9 4.2 4.0  Chloride 96 - 112 mmol/L - - -  CO2 22 - 29 mEq/L _0 Calcium 8.4 - 10.4 mg/dL 8.7 8.8 8.5  Total Protein 6.4 - 8.3 g/dL 6.7 6.8 6.6  Total Bilirubin 0.20 - 1.20 mg/dL 0.23 0.23 0.24  Alkaline Phos 40 - 150 U/L 86 96 92  AST 5 - 34 U/L _1 ALT 0 - 55 U/L _2 PATHOLOGY REPORT 06/05/2014 Diagnosis Breast, right, needle core biopsy - INVASIVE MAMMARY CARCINOMA, SEE COMMENT. Microscopic Comment Although the grade of tumor is best assessed at resection, with these biopsies, the invasive tumor is grade III. The tumor expresses cytokeratin AE1/3 immunostain and is negative for S-100 and melanin A immunostains. Breast prognostic studies  Results: Estrogen Receptor: 0%, NEGATIVE Progesterone Receptor: 0%, NEGATIVE Proliferation Marker Ki67: 98% COMMENT: The negative hormone receptor study(ies) in this case have an internal positive control.  HER-2/NEU BY CISH - NEGATIVE. RESULT RATIO OF HER2: CEP 17 SIGNALS 1.31 AVERAGE HER2 COPY NUMBER PER CELL 2.10  Diagnosis Breast, left, needle  core biopsy, UOQ - DUCTAL CARCINOMA IN-SITU. - SEE COMMENT. Microscopic Comment The ductal carcinoma in situ is low grade. The carcinoma cells are positive for e-cadherin and cytokeratin AE1/AE3. Smooth muscle myosin, calponin, and p63 stains highlight the presence of myoepithelial cells. Results: IMMUNOHISTOCHEMICAL AND MORPHOMETRIC ANALYSIS BY THE AUTOMATED CELLULAR IMAGING SYSTEM (ACIS) Estrogen Receptor: 100%, POSITIVE, STRONG STAINING INTENSITY Progesterone Receptor: 28%, POSITIVE, WEAK STAINING INTENSITY  Diagnosis 07/10/2014 Lymph node, needle/core biopsy, Left supraclavicular - BENIGN REACTIVE LYMPH NODE TISSUE. - NO  METASTATIC CARCINOMA IDENTIFIED.  Diagnosis 07/28/2014  FINE NEEDLE ASPIRATION, ENDOSCOPIC, 10R LYMPH NODE(SPECIMEN 1 OF 3 COLLECTED (07/28/14): NO MALIGNANT CELLS IDENTIFIED. MIXED LYMPHOID POPULATION.  RADIOGRAPHIC STUDIES: I have personally reviewed the radiological images as listed and agreed with the findings in the report.  ASSESSMENT & PLAN:  69 year old postmenopausal woman, with past medical history of seizure and arthritis, presented with a palpable mass in the inner upper quadrant of her right breast. Biopsy revealed poorly differentiated carcinoma, triple negative.  1. 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 -The PET scan showed hypermetabolic lymph nodes in the left supraclavicular, left cervical level II, right hilar and a periportal area. Although they are suspicious for distant note metastasis, both left [place] and right hilar node biopsy were negaative, so likely reactive adenopathy. -I discussed the [place] node and right hilar node biopsy which showed benign reactive lymph tissue -we have discussed her case in our breast tumor board, the consensus is proceed with  neoadjuvant chemotherapy, followed by surgery and radiation. -I recommended to start chemotherapy with dose dense Adriamycin and Cytoxan X4, followed by by biweekly TaxolX4, to maximally control her breast cancer. She agrees -She has completed 4 cycles of dose dense AC, however her breast mass has only shunk by 20-30%. Given the suboptimal response to [facility], I recommend the next chemotherapy to be weekly carbo and Taxol, giving her triple negative disease. Potential side effects from [place] and Taxol were discussed with patient, she agrees to proceed. -will repeat PET scan after she finishes chemotherapy -She is tolerating weekly chemotherapy well, we'll continue. Her right breast mass is getting smaller, good clinical response -lab reviewed, neutropenia improved, ANC 1.3 today, we'll proceed with [place] and Taxol today  Plan: -Week 6 chemo with  carbo and Taxol, continue weekly, hold carbo if ANC<=1 or consider GCSF  -last chemo on 9/16 -I'll see her back in 2 weeks.  All questions were answered. The patient knows to call the clinic with any problems, questions or concerns. I spent 20 minutes counseling the patient face to face. The total time spent in the appointment was 30 minutes and more than 50% was on counseling.     Truitt Merle, MD 11/07/2014 7:15 AM

## 2014-11-07 NOTE — Progress Notes (Signed)
OK to treat despite low ANC per Dr Feng. 

## 2014-11-07 NOTE — Patient Instructions (Signed)
[facility] Discharge Instructions for Patients Receiving Chemotherapy  Today you received the following chemotherapy agents Taxol/Carboplatin  To help prevent nausea and vomiting after your treatment, we encourage you to take your nausea medication    If you develop nausea and vomiting that is not controlled by your nausea medication, call the clinic.   BELOW ARE SYMPTOMS THAT SHOULD BE REPORTED IMMEDIATELY:  *FEVER GREATER THAN 100.5 F  *CHILLS WITH OR WITHOUT FEVER  NAUSEA AND VOMITING THAT IS NOT CONTROLLED WITH YOUR NAUSEA MEDICATION  *UNUSUAL SHORTNESS OF BREATH  *UNUSUAL BRUISING OR BLEEDING  TENDERNESS IN MOUTH AND THROAT WITH OR WITHOUT PRESENCE OF ULCERS  *URINARY PROBLEMS  *BOWEL PROBLEMS  UNUSUAL RASH Items with * indicate a potential emergency and should be followed up as soon as possible.  Feel free to call the clinic you have any questions or concerns. The clinic phone number is [phone].  Please show the CHEMO ALERT CARD at check-in to the Emergency Department and triage nurse.   

## 2014-11-07 NOTE — Telephone Encounter (Signed)
per pof to sch pt appt-gave pt copy of avs-sent MW emailt o sch trmt-pt to get updated sch next appt per her req

## 2014-11-07 NOTE — Telephone Encounter (Signed)
Message faxed to [place] stating pt was here today for chemo per pt request to [phone].

## 2014-11-14 ENCOUNTER — Other Ambulatory Visit: Payer: Medicare Other

## 2014-11-14 ENCOUNTER — Ambulatory Visit (HOSPITAL_BASED_OUTPATIENT_CLINIC_OR_DEPARTMENT_OTHER): Payer: Medicare Other

## 2014-11-14 ENCOUNTER — Other Ambulatory Visit (HOSPITAL_BASED_OUTPATIENT_CLINIC_OR_DEPARTMENT_OTHER): Payer: Medicare Other

## 2014-11-14 ENCOUNTER — Ambulatory Visit: Payer: Medicare Other | Admitting: Hematology

## 2014-11-14 ENCOUNTER — Telehealth: Payer: Self-pay | Admitting: *Deleted

## 2014-11-14 VITALS — BP 118/75 | HR 88 | Temp 97.8°F | Resp 20

## 2014-11-14 DIAGNOSIS — C50211 Malignant neoplasm of upper-inner quadrant of right female breast: Secondary | ICD-10-CM

## 2014-11-14 DIAGNOSIS — Z5111 Encounter for antineoplastic chemotherapy: Secondary | ICD-10-CM | POA: Diagnosis present

## 2014-11-14 LAB — CBC WITH DIFFERENTIAL/PLATELET
BASO%: 0.8 % (ref 0.0–2.0)
Basophils Absolute: 0 10*3/uL (ref 0.0–0.1)
EOS%: 4.5 % (ref 0.0–7.0)
Eosinophils Absolute: 0.1 10*3/uL (ref 0.0–0.5)
HCT: 31.6 % — ABNORMAL LOW (ref 34.8–46.6)
HGB: 10.9 g/dL — ABNORMAL LOW (ref 11.6–15.9)
LYMPH%: 35.4 % (ref 14.0–49.7)
MCH: 35.4 pg — ABNORMAL HIGH (ref 25.1–34.0)
MCHC: 34.4 g/dL (ref 31.5–36.0)
MCV: 103.1 fL — ABNORMAL HIGH (ref 79.5–101.0)
MONO#: 0.5 10*3/uL (ref 0.1–0.9)
MONO%: 16 % — ABNORMAL HIGH (ref 0.0–14.0)
NEUT#: 1.2 10*3/uL — ABNORMAL LOW (ref 1.5–6.5)
NEUT%: 43.3 % (ref 38.4–76.8)
Platelets: 181 10*3/uL (ref 145–400)
RBC: 3.06 10*6/uL — ABNORMAL LOW (ref 3.70–5.45)
RDW: 17 % — ABNORMAL HIGH (ref 11.2–14.5)
WBC: 2.8 10*3/uL — ABNORMAL LOW (ref 3.9–10.3)
lymph#: 1 10*3/uL (ref 0.9–3.3)

## 2014-11-14 LAB — COMPREHENSIVE METABOLIC PANEL (CC13)
ALT: 15 U/L (ref 0–55)
AST: 16 U/L (ref 5–34)
Albumin: 3.7 g/dL (ref 3.5–5.0)
Alkaline Phosphatase: 113 U/L (ref 40–150)
Anion Gap: 13 mEq/L — ABNORMAL HIGH (ref 3–11)
BUN: 17.7 mg/dL (ref 7.0–26.0)
CO2: 23 mEq/L (ref 22–29)
Calcium: 9.1 mg/dL (ref 8.4–10.4)
Chloride: 102 mEq/L (ref 98–109)
Creatinine: 0.7 mg/dL (ref 0.6–1.1)
EGFR: 88 mL/min/{1.73_m2} — ABNORMAL LOW (ref >90)
Glucose: 93 mg/dl (ref 70–140)
Potassium: 3.7 mEq/L (ref 3.5–5.1)
Sodium: 138 mEq/L (ref 136–145)
Total Bilirubin: 0.22 mg/dL (ref 0.20–1.20)
Total Protein: 7.2 g/dL (ref 6.4–8.3)

## 2014-11-14 MED ORDER — DIPHENHYDRAMINE HCL 50 MG/ML IJ SOLN
INTRAMUSCULAR | Status: AC
  Filled 2014-11-14: qty 1

## 2014-11-14 MED ORDER — SODIUM CHLORIDE 0.9 % IV SOLN
Freq: Once | INTRAVENOUS | Status: AC
  Administered 2014-11-14: 14:00:00 via INTRAVENOUS
  Filled 2014-11-14: qty 8

## 2014-11-14 MED ORDER — PACLITAXEL CHEMO INJECTION 300 MG/50ML
80.0000 mg/m2 | Freq: Once | INTRAVENOUS | Status: AC
  Administered 2014-11-14: 144 mg via INTRAVENOUS
  Filled 2014-11-14: qty 24

## 2014-11-14 MED ORDER — SODIUM CHLORIDE 0.9 % IV SOLN
Freq: Once | INTRAVENOUS | Status: AC
  Administered 2014-11-14: 14:00:00 via INTRAVENOUS

## 2014-11-14 MED ORDER — HEPARIN SOD (PORK) LOCK FLUSH 100 UNIT/ML IV SOLN
500.0000 [IU] | Freq: Once | INTRAVENOUS | Status: AC | PRN
  Administered 2014-11-14: 500 [IU]
  Filled 2014-11-14: qty 5

## 2014-11-14 MED ORDER — DIPHENHYDRAMINE HCL 50 MG/ML IJ SOLN
25.0000 mg | Freq: Once | INTRAMUSCULAR | Status: AC
  Administered 2014-11-14: 25 mg via INTRAVENOUS

## 2014-11-14 MED ORDER — FAMOTIDINE IN NACL 20-0.9 MG/50ML-% IV SOLN
20.0000 mg | Freq: Once | INTRAVENOUS | Status: AC
  Administered 2014-11-14: 20 mg via INTRAVENOUS

## 2014-11-14 MED ORDER — FAMOTIDINE IN NACL 20-0.9 MG/50ML-% IV SOLN
INTRAVENOUS | Status: AC
  Filled 2014-11-14: qty 50

## 2014-11-14 MED ORDER — SODIUM CHLORIDE 0.9 % IJ SOLN
10.0000 mL | INTRAMUSCULAR | Status: DC | PRN
  Administered 2014-11-14: 10 mL
  Filled 2014-11-14: qty 10

## 2014-11-14 MED ORDER — SODIUM CHLORIDE 0.9 % IV SOLN
171.2000 mg | Freq: Once | INTRAVENOUS | Status: AC
  Administered 2014-11-14: 170 mg via INTRAVENOUS
  Filled 2014-11-14: qty 17

## 2014-11-14 NOTE — Telephone Encounter (Signed)
Faxed note to [place]  @ [phone] stating pt here today for labs & treatment.

## 2014-11-14 NOTE — Patient Instructions (Signed)
[facility] Discharge Instructions for Patients Receiving Chemotherapy  Today you received the following chemotherapy agents:  Taxol and Carboplatin  To help prevent nausea and vomiting after your treatment, we encourage you to take your nausea medication as ordered per MD.   If you develop nausea and vomiting that is not controlled by your nausea medication, call the clinic.   BELOW ARE SYMPTOMS THAT SHOULD BE REPORTED IMMEDIATELY:  *FEVER GREATER THAN 100.5 F  *CHILLS WITH OR WITHOUT FEVER  NAUSEA AND VOMITING THAT IS NOT CONTROLLED WITH YOUR NAUSEA MEDICATION  *UNUSUAL SHORTNESS OF BREATH  *UNUSUAL BRUISING OR BLEEDING  TENDERNESS IN MOUTH AND THROAT WITH OR WITHOUT PRESENCE OF ULCERS  *URINARY PROBLEMS  *BOWEL PROBLEMS  UNUSUAL RASH Items with * indicate a potential emergency and should be followed up as soon as possible.  Feel free to call the clinic you have any questions or concerns. The clinic phone number is [phone].  Please show the CHEMO ALERT CARD at check-in to the Emergency Department and triage nurse.   

## 2014-11-14 NOTE — Progress Notes (Signed)
OK to treat with WBC-2.8 and ANC-1.2 per Dr. Burr Medico.

## 2014-11-21 ENCOUNTER — Ambulatory Visit (HOSPITAL_BASED_OUTPATIENT_CLINIC_OR_DEPARTMENT_OTHER): Payer: Medicare Other

## 2014-11-21 ENCOUNTER — Telehealth: Payer: Self-pay | Admitting: Hematology

## 2014-11-21 ENCOUNTER — Ambulatory Visit (HOSPITAL_BASED_OUTPATIENT_CLINIC_OR_DEPARTMENT_OTHER): Payer: Medicare Other | Admitting: Hematology

## 2014-11-21 ENCOUNTER — Telehealth: Payer: Self-pay | Admitting: *Deleted

## 2014-11-21 ENCOUNTER — Other Ambulatory Visit (HOSPITAL_BASED_OUTPATIENT_CLINIC_OR_DEPARTMENT_OTHER): Payer: Medicare Other

## 2014-11-21 ENCOUNTER — Encounter: Payer: Self-pay | Admitting: Hematology

## 2014-11-21 VITALS — BP 116/68 | HR 82 | Temp 98.5°F | Resp 18 | Ht 64.0 in | Wt 168.8 lb

## 2014-11-21 DIAGNOSIS — Z5111 Encounter for antineoplastic chemotherapy: Secondary | ICD-10-CM

## 2014-11-21 DIAGNOSIS — C50211 Malignant neoplasm of upper-inner quadrant of right female breast: Secondary | ICD-10-CM | POA: Diagnosis not present

## 2014-11-21 LAB — CBC WITH DIFFERENTIAL/PLATELET
BASO%: 1.1 % (ref 0.0–2.0)
Basophils Absolute: 0 10*3/uL (ref 0.0–0.1)
EOS%: 2.9 % (ref 0.0–7.0)
Eosinophils Absolute: 0.1 10*3/uL (ref 0.0–0.5)
HCT: 28.7 % — ABNORMAL LOW (ref 34.8–46.6)
HGB: 10 g/dL — ABNORMAL LOW (ref 11.6–15.9)
LYMPH%: 32.2 % (ref 14.0–49.7)
MCH: 36 pg — ABNORMAL HIGH (ref 25.1–34.0)
MCHC: 34.9 g/dL (ref 31.5–36.0)
MCV: 103.2 fL — ABNORMAL HIGH (ref 79.5–101.0)
MONO#: 0.4 10*3/uL (ref 0.1–0.9)
MONO%: 17.2 % — ABNORMAL HIGH (ref 0.0–14.0)
NEUT#: 1.2 10*3/uL — ABNORMAL LOW (ref 1.5–6.5)
NEUT%: 46.6 % (ref 38.4–76.8)
Platelets: 172 10*3/uL (ref 145–400)
RBC: 2.78 10*6/uL — ABNORMAL LOW (ref 3.70–5.45)
RDW: 17.1 % — ABNORMAL HIGH (ref 11.2–14.5)
WBC: 2.5 10*3/uL — ABNORMAL LOW (ref 3.9–10.3)
lymph#: 0.8 10*3/uL — ABNORMAL LOW (ref 0.9–3.3)

## 2014-11-21 LAB — COMPREHENSIVE METABOLIC PANEL (CC13)
ALT: 12 U/L (ref 0–55)
AST: 15 U/L (ref 5–34)
Albumin: 3.4 g/dL — ABNORMAL LOW (ref 3.5–5.0)
Alkaline Phosphatase: 106 U/L (ref 40–150)
Anion Gap: 8 mEq/L (ref 3–11)
BUN: 15.1 mg/dL (ref 7.0–26.0)
CO2: 26 mEq/L (ref 22–29)
Calcium: 9 mg/dL (ref 8.4–10.4)
Chloride: 104 mEq/L (ref 98–109)
Creatinine: 0.6 mg/dL (ref 0.6–1.1)
EGFR: >90 mL/min/{1.73_m2} (ref >90)
Glucose: 82 mg/dl (ref 70–140)
Potassium: 4.1 mEq/L (ref 3.5–5.1)
Sodium: 138 mEq/L (ref 136–145)
Total Bilirubin: <0.2 mg/dL (ref 0.20–1.20)
Total Protein: 6.6 g/dL (ref 6.4–8.3)

## 2014-11-21 MED ORDER — SODIUM CHLORIDE 0.9 % IV SOLN
Freq: Once | INTRAVENOUS | Status: AC
  Administered 2014-11-21: 09:00:00 via INTRAVENOUS

## 2014-11-21 MED ORDER — DIPHENHYDRAMINE HCL 50 MG/ML IJ SOLN
INTRAMUSCULAR | Status: AC
Start: 2014-11-21 — End: 2014-11-21
  Filled 2014-11-21: qty 1

## 2014-11-21 MED ORDER — PACLITAXEL CHEMO INJECTION 300 MG/50ML
80.0000 mg/m2 | Freq: Once | INTRAVENOUS | Status: AC
  Administered 2014-11-21: 144 mg via INTRAVENOUS
  Filled 2014-11-21: qty 24

## 2014-11-21 MED ORDER — SODIUM CHLORIDE 0.9 % IV SOLN
Freq: Once | INTRAVENOUS | Status: AC
  Administered 2014-11-21: 10:00:00 via INTRAVENOUS
  Filled 2014-11-21: qty 8

## 2014-11-21 MED ORDER — FAMOTIDINE IN NACL 20-0.9 MG/50ML-% IV SOLN
20.0000 mg | Freq: Once | INTRAVENOUS | Status: AC
  Administered 2014-11-21: 20 mg via INTRAVENOUS

## 2014-11-21 MED ORDER — SODIUM CHLORIDE 0.9 % IJ SOLN
10.0000 mL | INTRAMUSCULAR | Status: DC | PRN
  Administered 2014-11-21: 10 mL
  Filled 2014-11-21: qty 10

## 2014-11-21 MED ORDER — SODIUM CHLORIDE 0.9 % IV SOLN
171.2000 mg | Freq: Once | INTRAVENOUS | Status: AC
  Administered 2014-11-21: 170 mg via INTRAVENOUS
  Filled 2014-11-21: qty 17

## 2014-11-21 MED ORDER — DIPHENHYDRAMINE HCL 50 MG/ML IJ SOLN
25.0000 mg | Freq: Once | INTRAMUSCULAR | Status: AC
  Administered 2014-11-21: 25 mg via INTRAVENOUS

## 2014-11-21 MED ORDER — FAMOTIDINE IN NACL 20-0.9 MG/50ML-% IV SOLN
INTRAVENOUS | Status: AC
Start: 2014-11-21 — End: 2014-11-21
  Filled 2014-11-21: qty 50

## 2014-11-21 MED ORDER — HEPARIN SOD (PORK) LOCK FLUSH 100 UNIT/ML IV SOLN
500.0000 [IU] | Freq: Once | INTRAVENOUS | Status: AC | PRN
  Administered 2014-11-21: 500 [IU]
  Filled 2014-11-21: qty 5

## 2014-11-21 NOTE — Patient Instructions (Signed)
[facility] Discharge Instructions for Patients Receiving Chemotherapy  Today you received the following chemotherapy agents taxol/carboplatin  To help prevent nausea and vomiting after your treatment, we encourage you to take your nausea medication as directed   If you develop nausea and vomiting that is not controlled by your nausea medication, call the clinic.   BELOW ARE SYMPTOMS THAT SHOULD BE REPORTED IMMEDIATELY:  *FEVER GREATER THAN 100.5 F  *CHILLS WITH OR WITHOUT FEVER  NAUSEA AND VOMITING THAT IS NOT CONTROLLED WITH YOUR NAUSEA MEDICATION  *UNUSUAL SHORTNESS OF BREATH  *UNUSUAL BRUISING OR BLEEDING  TENDERNESS IN MOUTH AND THROAT WITH OR WITHOUT PRESENCE OF ULCERS  *URINARY PROBLEMS  *BOWEL PROBLEMS  UNUSUAL RASH Items with * indicate a potential emergency and should be followed up as soon as possible.  Feel free to call the clinic you have any questions or concerns. The clinic phone number is [phone].  

## 2014-11-21 NOTE — Telephone Encounter (Signed)
per pof to sch pt appt-sent MW emailt o sch taxol-cld pt to adv to call [facility] [phone] to do screening and make appt-adv will call with completed sch once reply

## 2014-11-21 NOTE — Progress Notes (Signed)
Ok to treat with ANC 1.2 per Dr. Feng. 

## 2014-11-21 NOTE — Progress Notes (Signed)
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 Ultrasound of the right axilla is negative   06/05/2014 Pathology Results INVASIVE MAMMARY CARCINOMA   06/05/2014 Receptors her2 Estrogen Receptor: 0%, NEGATIVE Progesterone Receptor: 0%, NEGATIVE Proliferation Marker Ki67: 98%  HER-2/NEU BY CISH - NEGATIVE   06/11/2014 Initial Diagnosis Breast cancer of upper-inner quadrant of right female breast   07/02/2014 PET scan 1. Intensely hypermetabolic mass in the medial RIGHT chest wall with hypermetabolic right axillary node 2. Hypermetabolic nodal metastasis in the LEFT level 2 and LEFT supraclavicular and periportal node (SUV 5).    07/04/2014 Pathology Results Left breast mass biopsy showed DCIS, ER100%, PR 48%   07/10/2014 Pathology Results left [place] node biopsy was negative for malignant cells, benign reactive lymph tissue    07/18/2014 -   Chemotherapy Dose dense Adriamycin and Cytoxan, every 2 weeks, X4, followed by weekly Taxol for 12 weeks.   07/28/2014 Pathology Results Right hilar and are lymph node biopsy showed no malignant cells, mixed lymphoid population.   08/11/2014 Miscellaneous nadir CBC: WBC 0.54, hemoglobin 8.9, hematocrit 26%, platelet 125    HISTORY OF PRESENTING ILLNESS:  Kristin Pennington 69 y.o. female is here because of recently diagnosed poorly differentiated carcinoma in her right breast.  She found a lump in her right breast 3 weeks ago, mild tenderness,  No other complains. Last mammo was in oct 2015 which was negative. She feels well overall. She denies any pain, cough, shortness breast, GI symptoms. She has good energy level and appetite, no recent weight loss.  Her last colonoscopy was for 5 years ago which was negative per patient. Her last Pap smear was 3 years ago which was negative per patient.  INTERIM HISTORY: Kristin Pennington returns for follow-up and cycle 9 chemotherapy .  She is doing well overall, and tolerating the treatment well. She does notice worsening of her knee and joint pain lately, but she has not need additional pain medication overdose. No significant nausea, neuropathy, or other complaints. Moderate fatigue, able to function well at home. No fever or chills.   MEDICAL HISTORY:  Past Medical History  Diagnosis Date  . Wears glasses   . Wears partial dentures     top  . 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 Arthritis     knee  . Breast cancer     bilateral, right breast mass, cancer both breast, x1 chemo  a week  ago.     SURGICAL HISTORY: Past Surgical History  Procedure Laterality Date  . Appendectomy    . Cholecystectomy    . Abdominal hysterectomy    . Tonsillectomy    . Colonoscopy    . Portacath placement N/A 06/30/2014    Procedure: INSERTION PORT-A-CATH WITH ULTRA SOUND ON STANDBY;  Surgeon: Fanny Skates, MD;  Location: [facility];  Service: General;   Laterality: N/A;  . Endobronchial ultrasound Bilateral 07/28/2014    Procedure: ENDOBRONCHIAL ULTRASOUND;  Surgeon: Collene Gobble, MD;  Location: [facility];  Service: Cardiopulmonary;  Laterality: Bilateral;   SOCIAL HISTORY: Social History   Social History  . Marital Status: Married    Spouse Name: N/A  . Number of Children: N/A  . Years of Education: N/A   Occupational History  . Not on file.   Social History Main Topics  . Smoking status: Former Smoker -- 1.00 packs/day for 18 years    Quit date: 03/28/1988  . Smokeless tobacco: Not on file  . Alcohol Use: No  . Drug Use: No  . Sexual Activity: Not on file   Other Topics Concern  . Not on file   Social History Narrative     GYN HISTORY  Menarchal: 11 LMP: 1994, hysrectomy  Contraceptive: no  HRT: since 33  G2P1, one miscarriage     FAMILY HISTORY: Family History  Problem Relation Age of Onset  . Cancer Father 79    gastric cancer   . Cancer Sister 17    lung cancer     ALLERGIES:  is allergic to aspirin; codeine; diclofenac; meloxicam; nitrofurantoin; other; pyridium; sulfa antibiotics; tetracyclines & related; and tape.  MEDICATIONS:  Current Outpatient Prescriptions  Medication Sig Dispense Refill  . aspirin EC 81 MG tablet Take 81 mg by mouth daily.     . calcium carbonate (OS-CAL) 600 MG TABS tablet Take 600 mg by mouth 2 (two) times daily with a meal.    . cyanocobalamin (,VITAMIN B-12,) 1000 MCG/ML injection Inject 1,000 mcg into the muscle every 30 (thirty) days.     Marland Kitchen HYDROcodone-acetaminophen (NORCO) 5-325 MG per tablet Take 1-2 tablets by mouth every 6 (six) hours as needed for moderate pain or severe pain. (Patient not taking: Reported on 11/07/2014) 30 tablet 0  . levETIRAcetam (KEPPRA) 500 MG tablet Take 1 tablet by mouth 2 (two) times daily.    Marland Kitchen lidocaine-prilocaine (EMLA) cream Apply 1 application topically as needed. Apply to portacath  1 hour to 1 1/2 hours prior to procedures as  needed.   Cover with clear plastic. 30 g 1  . oxaprozin (DAYPRO) 600 MG tablet Take 1 tablet by mouth 2 (two) times daily.    . phenytoin (DILANTIN) 100 MG ER capsule Take 1 tablet by mouth 4 (four) times daily. On  Sat , Sun, Tues, Wed, and  Thursdays    -  Take   168m  QID.    .Marland Kitchensodium chloride (OCEAN) 0.65 % SOLN nasal spray Place 1 spray into both nostrils as needed for congestion.    .Marland Kitchenzolpidem (AMBIEN) 5 MG tablet Take 1 tablet (5 mg total) by mouth at bedtime as needed for sleep. 30 tablet 0   No current facility-administered medications for this visit.    REVIEW OF SYSTEMS:   Constitutional: Denies fevers, chills or abnormal night sweats Eyes: Denies blurriness of vision, double vision or watery eyes Ears, nose, mouth, throat, and face: Denies mucositis or sore throat Respiratory: Denies  cough, dyspnea or wheezes Cardiovascular: Denies palpitation, chest discomfort or lower extremity swelling Gastrointestinal:  Denies nausea, heartburn or change in bowel habits Skin: Denies abnormal skin rashes Lymphatics: Denies new lymphadenopathy or easy bruising Neurological:Denies numbness, tingling or new weaknesses Behavioral/Psych: Mood is stable, no new changes  All other systems were reviewed with the patient and are negative.  PHYSICAL EXAMINATION: ECOG PERFORMANCE STATUS: 1  BP 116/68 mmHg  Pulse 82  Temp(Src) 98.5 F (36.9 C) (Oral)  Resp 18  Ht '5\' 4"'  (1.626 m)  Wt 168 lb 12.8 oz (76.567 kg)  BMI 28.96 kg/m2  SpO2 99%  GENERAL:alert, no distress and comfortable SKIN: skin color, texture, turgor are normal, no rashes or significant lesions EYES: normal, conjunctiva are pink and non-injected, sclera clear OROPHARYNX:no exudate, no erythema and lips, buccal mucosa, and tongue normal  NECK: supple, thyroid normal size, non-tender, without nodularity LYMPH:  no palpable lymphadenopathy in the cervical, axillary or inguinal LUNGS: clear to auscultation and percussion with normal  breathing effort HEART: regular rate & rhythm and no murmurs and no lower extremity edema ABDOMEN:abdomen soft, non-tender and normal bowel sounds Musculoskeletal:no cyanosis of digits and no clubbing  PSYCH: alert & oriented x 3 with fluent speech NEURO: no focal motor/sensory deficits Breasts: Breast inspection showed them to be symmetrical with no nipple discharge.  The previous palpable mass (initially 4.5X6.0cm) mass in the 10 clock positionof her right breast is not palpable anymore. Palpation of the left breasts and axilla revealed no obvious mass that I could appreciate.   weight on LABORATORY DATA:  I have reviewed the data as listed CBC Latest Ref Rng 11/21/2014 11/14/2014 11/07/2014  WBC 3.9 - 10.3 10e3/uL 2.5(L) 2.8(L) 2.5(L)  Hemoglobin 11.6 - 15.9 g/dL 10.0(L) 10.9(L) 10.7(L)  Hematocrit 34.8 - 46.6 % 28.7(L) 31.6(L) 31.2(L)  Platelets 145 - 400 10e3/uL 172 181 142(L)    CMP Latest Ref Rng 11/21/2014 11/14/2014 11/07/2014  Glucose 70 - 140 mg/dl 82 93 84  BUN 7.0 - 26.0 mg/dL 15.1 17.7 15.4  Creatinine 0.6 - 1.1 mg/dL 0.6 0.7 0.7  Sodium 136 - 145 mEq/L 138 138 138  Potassium 3.5 - 5.1 mEq/L 4.1 3.7 3.8  Chloride 96 - 112 mmol/L - - -  CO2 22 - 29 mEq/L '26 23 26  ' Calcium 8.4 - 10.4 mg/dL 9.0 9.1 8.7  Total Protein 6.4 - 8.3 g/dL 6.6 7.2 6.8  Total Bilirubin 0.20 - 1.20 mg/dL <0.20 0.22 0.21  Alkaline Phos 40 - 150 U/L 106 113 105  AST 5 - 34 U/L '15 16 15  ' ALT 0 - 55 U/L '12 15 13      ' PATHOLOGY REPORT 06/05/2014 Diagnosis Breast, right, needle core biopsy - INVASIVE MAMMARY CARCINOMA, SEE COMMENT. Microscopic Comment Although the grade of tumor is best assessed at resection, with these biopsies, the invasive tumor is grade III. The tumor expresses cytokeratin AE1/3 immunostain and is negative for S-100 and melanin A immunostains. Breast prognostic studies  Results: Estrogen Receptor: 0%, NEGATIVE Progesterone Receptor: 0%, NEGATIVE Proliferation Marker Ki67:  98% COMMENT: The negative hormone receptor study(ies) in this case have an internal positive control.  HER-2/NEU BY CISH - NEGATIVE. RESULT RATIO OF HER2: CEP 17 SIGNALS 1.31 AVERAGE HER2 COPY NUMBER PER CELL 2.10  Diagnosis Breast, left, needle core biopsy, UOQ - DUCTAL CARCINOMA IN-SITU. - SEE COMMENT. Microscopic Comment The ductal carcinoma in situ is low grade. The carcinoma cells are positive for e-cadherin and cytokeratin AE1/AE3. Smooth muscle myosin, calponin, and p63  stains highlight the presence of myoepithelial cells. Results: IMMUNOHISTOCHEMICAL AND MORPHOMETRIC ANALYSIS BY THE AUTOMATED CELLULAR IMAGING SYSTEM (ACIS) Estrogen Receptor: 100%, POSITIVE, STRONG STAINING INTENSITY Progesterone Receptor: 28%, POSITIVE, WEAK STAINING INTENSITY  Diagnosis 07/10/2014 Lymph node, needle/core biopsy, Left supraclavicular - BENIGN REACTIVE LYMPH NODE TISSUE. - NO METASTATIC CARCINOMA IDENTIFIED.  Diagnosis 07/28/2014  FINE NEEDLE ASPIRATION, ENDOSCOPIC, 10R LYMPH NODE(SPECIMEN 1 OF 3 COLLECTED (07/28/14): NO MALIGNANT CELLS IDENTIFIED. MIXED LYMPHOID POPULATION.  RADIOGRAPHIC STUDIES: I have personally reviewed the radiological images as listed and agreed with the findings in the report.  ASSESSMENT & PLAN:  70 year old postmenopausal woman, with past medical history of seizure and arthritis, presented with a palpable mass in the inner upper quadrant of her right breast. Biopsy revealed poorly differentiated carcinoma, triple negative.  1. 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 -The PET scan showed hypermetabolic lymph nodes in the left supraclavicular, left cervical level II, right hilar and a periportal area. Although they are suspicious for distant note metastasis, both left [place] and  right hilar node biopsy were negaative, so likely reactive adenopathy. -I discussed the [place] node and right hilar node biopsy which showed benign reactive lymph tissue -we have discussed her case in our breast tumor board, the consensus is proceed with neoadjuvant chemotherapy, followed by surgery and radiation. -I recommended to start chemotherapy with dose dense Adriamycin and Cytoxan X4, followed by by biweekly TaxolX4, to maximally control her breast cancer. She agrees -She has completed 4 cycles of dose dense AC, however her breast mass has only shunk by 20-30%. Given the suboptimal response to [facility], I recommend the next chemotherapy to be weekly carbo and Taxol, giving her triple negative disease. Potential side effects from [place] and Taxol were discussed with patient, she agrees to proceed. -will repeat PET scan after she finishes chemotherapy -She is tolerating weekly chemotherapy well, we'll continue. Her right breast mass is getting smaller, good clinical response -lab reviewed, neutropenia improved, ANC 1.2 today, we'll proceed with [place] and Taxol today  Plan: -Week 9 chemo with  carbo and Taxol, continue weekly, hold carbo if ANC<=1 or consider GCSF  -last chemo on 9/16 -I'll see her back in 2 weeks. -breast MRI around 9/20, she will see Dr. Dalbert Batman on 9/26.   All questions were answered. The patient knows to call the clinic with any problems, questions or concerns. I spent 20 minutes counseling the patient face to face. The total time spent in the appointment was 30 minutes and more than 50% was on counseling.     Truitt Merle, MD 11/21/2014 9:10 AM

## 2014-11-21 NOTE — Telephone Encounter (Signed)
Per staff message and POF I have scheduled appts. Advised scheduler of appts and to move labas  JMW

## 2014-11-25 ENCOUNTER — Telehealth: Payer: Self-pay | Admitting: *Deleted

## 2014-11-25 NOTE — Telephone Encounter (Signed)
Pt left message wanting to know if pt should keep appt for dental cleaning on 11/26/14.   Spoke with pt and instructed pt that she could have very light dental cleaning - no deep cleaning, and nothing invasive.  Instructed pt to let her dentist know that pt is under active chemo.   Pt voiced understanding. Pt's  Phone      [phone].

## 2014-11-28 ENCOUNTER — Other Ambulatory Visit: Payer: Self-pay | Admitting: Hematology

## 2014-11-28 ENCOUNTER — Ambulatory Visit: Payer: Medicare Other

## 2014-11-28 ENCOUNTER — Other Ambulatory Visit (HOSPITAL_BASED_OUTPATIENT_CLINIC_OR_DEPARTMENT_OTHER): Payer: Medicare Other

## 2014-11-28 ENCOUNTER — Other Ambulatory Visit: Payer: Medicare Other

## 2014-11-28 DIAGNOSIS — C50211 Malignant neoplasm of upper-inner quadrant of right female breast: Secondary | ICD-10-CM | POA: Diagnosis present

## 2014-11-28 LAB — COMPREHENSIVE METABOLIC PANEL (CC13)
ALT: 13 U/L (ref 0–55)
AST: 18 U/L (ref 5–34)
Albumin: 3.6 g/dL (ref 3.5–5.0)
Alkaline Phosphatase: 98 U/L (ref 40–150)
Anion Gap: 9 mEq/L (ref 3–11)
BUN: 13.9 mg/dL (ref 7.0–26.0)
CO2: 27 mEq/L (ref 22–29)
Calcium: 9.2 mg/dL (ref 8.4–10.4)
Chloride: 104 mEq/L (ref 98–109)
Creatinine: 0.7 mg/dL (ref 0.6–1.1)
EGFR: 88 mL/min/{1.73_m2} — ABNORMAL LOW (ref >90)
Glucose: 99 mg/dl (ref 70–140)
Potassium: 3.6 mEq/L (ref 3.5–5.1)
Sodium: 140 mEq/L (ref 136–145)
Total Bilirubin: 0.25 mg/dL (ref 0.20–1.20)
Total Protein: 7.1 g/dL (ref 6.4–8.3)

## 2014-11-28 LAB — CBC WITH DIFFERENTIAL/PLATELET
BASO%: 1 % (ref 0.0–2.0)
Basophils Absolute: 0 10*3/uL (ref 0.0–0.1)
EOS%: 2.9 % (ref 0.0–7.0)
Eosinophils Absolute: 0.1 10*3/uL (ref 0.0–0.5)
HCT: 30.3 % — ABNORMAL LOW (ref 34.8–46.6)
HGB: 10.5 g/dL — ABNORMAL LOW (ref 11.6–15.9)
LYMPH%: 41.5 % (ref 14.0–49.7)
MCH: 35.5 pg — ABNORMAL HIGH (ref 25.1–34.0)
MCHC: 34.7 g/dL (ref 31.5–36.0)
MCV: 102.4 fL — ABNORMAL HIGH (ref 79.5–101.0)
MONO#: 0.5 10*3/uL (ref 0.1–0.9)
MONO%: 24.6 % — ABNORMAL HIGH (ref 0.0–14.0)
NEUT#: 0.6 10*3/uL — ABNORMAL LOW (ref 1.5–6.5)
NEUT%: 30 % — ABNORMAL LOW (ref 38.4–76.8)
Platelets: 156 10*3/uL (ref 145–400)
RBC: 2.96 10*6/uL — ABNORMAL LOW (ref 3.70–5.45)
RDW: 15.8 % — ABNORMAL HIGH (ref 11.2–14.5)
WBC: 2.1 10*3/uL — ABNORMAL LOW (ref 3.9–10.3)
lymph#: 0.9 10*3/uL (ref 0.9–3.3)

## 2014-11-28 NOTE — Progress Notes (Signed)
Due to [place] of 0.6, we will hold treatment today per Dr. Burr Medico.  Spoke to patient in lobby.  Pt has no c/o fever or symptoms of infection.  Educated pt about neutropenic precautions.  Instructed patient to return next week as scheduled.  Pt given copy of labs.  Pt verbalized understanding.

## 2014-12-03 DIAGNOSIS — M1711 Unilateral primary osteoarthritis, right knee: Secondary | ICD-10-CM | POA: Diagnosis not present

## 2014-12-03 DIAGNOSIS — M25561 Pain in right knee: Secondary | ICD-10-CM | POA: Diagnosis not present

## 2014-12-04 DIAGNOSIS — H01001 Unspecified blepharitis right upper eyelid: Secondary | ICD-10-CM | POA: Diagnosis not present

## 2014-12-04 DIAGNOSIS — H40013 Open angle with borderline findings, low risk, bilateral: Secondary | ICD-10-CM | POA: Diagnosis not present

## 2014-12-04 DIAGNOSIS — H01021 Squamous blepharitis right upper eyelid: Secondary | ICD-10-CM | POA: Diagnosis not present

## 2014-12-04 DIAGNOSIS — H2513 Age-related nuclear cataract, bilateral: Secondary | ICD-10-CM | POA: Diagnosis not present

## 2014-12-04 DIAGNOSIS — H01004 Unspecified blepharitis left upper eyelid: Secondary | ICD-10-CM | POA: Diagnosis not present

## 2014-12-04 DIAGNOSIS — H01024 Squamous blepharitis left upper eyelid: Secondary | ICD-10-CM | POA: Diagnosis not present

## 2014-12-05 ENCOUNTER — Encounter: Payer: Self-pay | Admitting: Hematology

## 2014-12-05 ENCOUNTER — Ambulatory Visit (HOSPITAL_BASED_OUTPATIENT_CLINIC_OR_DEPARTMENT_OTHER): Payer: Medicare Other | Admitting: Hematology

## 2014-12-05 ENCOUNTER — Ambulatory Visit (HOSPITAL_BASED_OUTPATIENT_CLINIC_OR_DEPARTMENT_OTHER): Payer: Medicare Other

## 2014-12-05 ENCOUNTER — Other Ambulatory Visit (HOSPITAL_BASED_OUTPATIENT_CLINIC_OR_DEPARTMENT_OTHER): Payer: Medicare Other

## 2014-12-05 ENCOUNTER — Telehealth: Payer: Self-pay | Admitting: Hematology

## 2014-12-05 ENCOUNTER — Telehealth: Payer: Self-pay | Admitting: *Deleted

## 2014-12-05 VITALS — BP 125/75 | HR 72 | Temp 97.8°F | Resp 18 | Ht 64.0 in | Wt 169.7 lb

## 2014-12-05 DIAGNOSIS — C50211 Malignant neoplasm of upper-inner quadrant of right female breast: Secondary | ICD-10-CM

## 2014-12-05 DIAGNOSIS — D701 Agranulocytosis secondary to cancer chemotherapy: Secondary | ICD-10-CM

## 2014-12-05 DIAGNOSIS — D6481 Anemia due to antineoplastic chemotherapy: Secondary | ICD-10-CM

## 2014-12-05 DIAGNOSIS — Z171 Estrogen receptor negative status [ER-]: Secondary | ICD-10-CM

## 2014-12-05 DIAGNOSIS — Z5111 Encounter for antineoplastic chemotherapy: Secondary | ICD-10-CM | POA: Diagnosis present

## 2014-12-05 LAB — COMPREHENSIVE METABOLIC PANEL (CC13)
ALT: 13 U/L (ref 0–55)
AST: 15 U/L (ref 5–34)
Albumin: 3.5 g/dL (ref 3.5–5.0)
Alkaline Phosphatase: 110 U/L (ref 40–150)
Anion Gap: 9 mEq/L (ref 3–11)
BUN: 14.1 mg/dL (ref 7.0–26.0)
CO2: 27 mEq/L (ref 22–29)
Calcium: 9.2 mg/dL (ref 8.4–10.4)
Chloride: 105 mEq/L (ref 98–109)
Creatinine: 0.7 mg/dL (ref 0.6–1.1)
EGFR: 89 mL/min/{1.73_m2} — ABNORMAL LOW (ref >90)
Glucose: 85 mg/dl (ref 70–140)
Potassium: 3.7 mEq/L (ref 3.5–5.1)
Sodium: 140 mEq/L (ref 136–145)
Total Bilirubin: 0.32 mg/dL (ref 0.20–1.20)
Total Protein: 6.8 g/dL (ref 6.4–8.3)

## 2014-12-05 LAB — CBC WITH DIFFERENTIAL/PLATELET
BASO%: 1.2 % (ref 0.0–2.0)
Basophils Absolute: 0 10*3/uL (ref 0.0–0.1)
EOS%: 2.6 % (ref 0.0–7.0)
Eosinophils Absolute: 0.1 10*3/uL (ref 0.0–0.5)
HCT: 30.9 % — ABNORMAL LOW (ref 34.8–46.6)
HGB: 10.7 g/dL — ABNORMAL LOW (ref 11.6–15.9)
LYMPH%: 27.3 % (ref 14.0–49.7)
MCH: 36 pg — ABNORMAL HIGH (ref 25.1–34.0)
MCHC: 34.6 g/dL (ref 31.5–36.0)
MCV: 104.1 fL — ABNORMAL HIGH (ref 79.5–101.0)
MONO#: 0.7 10*3/uL (ref 0.1–0.9)
MONO%: 22.5 % — ABNORMAL HIGH (ref 0.0–14.0)
NEUT#: 1.3 10*3/uL — ABNORMAL LOW (ref 1.5–6.5)
NEUT%: 46.4 % (ref 38.4–76.8)
Platelets: 196 10*3/uL (ref 145–400)
RBC: 2.97 10*6/uL — ABNORMAL LOW (ref 3.70–5.45)
RDW: 17 % — ABNORMAL HIGH (ref 11.2–14.5)
WBC: 2.9 10*3/uL — ABNORMAL LOW (ref 3.9–10.3)
lymph#: 0.8 10*3/uL — ABNORMAL LOW (ref 0.9–3.3)

## 2014-12-05 MED ORDER — PACLITAXEL CHEMO INJECTION 300 MG/50ML
80.0000 mg/m2 | Freq: Once | INTRAVENOUS | Status: AC
  Administered 2014-12-05: 144 mg via INTRAVENOUS
  Filled 2014-12-05: qty 24

## 2014-12-05 MED ORDER — FAMOTIDINE IN NACL 20-0.9 MG/50ML-% IV SOLN
INTRAVENOUS | Status: AC
  Filled 2014-12-05: qty 50

## 2014-12-05 MED ORDER — HEPARIN SOD (PORK) LOCK FLUSH 100 UNIT/ML IV SOLN
500.0000 [IU] | Freq: Once | INTRAVENOUS | Status: AC | PRN
  Administered 2014-12-05: 500 [IU]
  Filled 2014-12-05: qty 5

## 2014-12-05 MED ORDER — SODIUM CHLORIDE 0.9 % IV SOLN
171.2000 mg | Freq: Once | INTRAVENOUS | Status: AC
  Administered 2014-12-05: 170 mg via INTRAVENOUS
  Filled 2014-12-05: qty 17

## 2014-12-05 MED ORDER — SODIUM CHLORIDE 0.9 % IV SOLN
Freq: Once | INTRAVENOUS | Status: AC
  Administered 2014-12-05: 10:00:00 via INTRAVENOUS

## 2014-12-05 MED ORDER — DIPHENHYDRAMINE HCL 50 MG/ML IJ SOLN
25.0000 mg | Freq: Once | INTRAMUSCULAR | Status: AC
  Administered 2014-12-05: 25 mg via INTRAVENOUS

## 2014-12-05 MED ORDER — SODIUM CHLORIDE 0.9 % IV SOLN
Freq: Once | INTRAVENOUS | Status: AC
  Administered 2014-12-05: 10:00:00 via INTRAVENOUS
  Filled 2014-12-05: qty 8

## 2014-12-05 MED ORDER — SODIUM CHLORIDE 0.9 % IJ SOLN
10.0000 mL | INTRAMUSCULAR | Status: DC | PRN
  Administered 2014-12-05: 10 mL
  Filled 2014-12-05: qty 10

## 2014-12-05 MED ORDER — DIPHENHYDRAMINE HCL 50 MG/ML IJ SOLN
INTRAMUSCULAR | Status: AC
  Filled 2014-12-05: qty 1

## 2014-12-05 MED ORDER — FAMOTIDINE IN NACL 20-0.9 MG/50ML-% IV SOLN
20.0000 mg | Freq: Once | INTRAVENOUS | Status: AC
  Administered 2014-12-05: 20 mg via INTRAVENOUS

## 2014-12-05 NOTE — Progress Notes (Signed)
OK to treat despite low WBC & ANC per Dr Burr Medico. [place]

## 2014-12-05 NOTE — Telephone Encounter (Signed)
per pof to sch pt appt-gave pt copy of avs-sent MW email to sch pt trmt on 9/23-r/s MRI w/ [facility]-gave pt avs

## 2014-12-05 NOTE — Telephone Encounter (Signed)
Per staff message and POF I have scheduled appts. Advised scheduler of appts. JMW  

## 2014-12-05 NOTE — Progress Notes (Signed)
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 Ultrasound of the right axilla is negative   06/05/2014 Pathology Results INVASIVE MAMMARY CARCINOMA   06/05/2014 Receptors her2 Estrogen Receptor: 0%, NEGATIVE Progesterone Receptor: 0%, NEGATIVE Proliferation Marker Ki67: 98%  HER-2/NEU BY CISH - NEGATIVE   06/11/2014 Initial Diagnosis Breast cancer of upper-inner quadrant of right female breast   07/02/2014 PET scan 1. Intensely hypermetabolic mass in the medial RIGHT chest wall with hypermetabolic right axillary node 2. Hypermetabolic nodal metastasis in the LEFT level 2 and LEFT supraclavicular and periportal node (SUV 5).    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   07/28/2014 Pathology Results Right hilar and are lymph node biopsy showed no malignant cells, mixed lymphoid population.   08/11/2014 Miscellaneous nadir CBC: WBC 0.54, hemoglobin 8.9, hematocrit 26%, platelet 125    HISTORY OF PRESENTING ILLNESS:  Kristin Pennington 69 y.o. female is here because of recently diagnosed poorly differentiated carcinoma in her right breast.  She found a lump in her right breast 3 weeks ago, mild tenderness,  No other complains. Last mammo was in oct 2015 which was negative. She feels well overall. She denies any pain, cough, shortness breast, GI symptoms. She has good energy level and appetite, no recent weight loss.  Her last colonoscopy was for 5 years ago which was negative per patient. Her last Pap smear was 3 years ago which was negative per patient.  INTERIM HISTORY: Kristin Pennington returns for follow-up and cycle 10 chemotherapy .  The chemotherapy was held last week due to neutropenia. She denies any fever, chill, productive cough or other new symptoms. Her main complaint is leg pain, from knee to ankle. She was seen by her orthopedic surgeon a few days ago, who did not offer steroids injection, but given her a prescription of tramadol. It seems to help some. She otherwise doing well, no nausea, energy level and appetite is decent, no significant weight loss.  MEDICAL HISTORY:  Past Medical History  Diagnosis Date  . Wears glasses   . Wears partial dentures     top  . 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 Arthritis  knee  . Breast cancer     bilateral, right breast mass, cancer both breast, x1 chemo  a week ago.     SURGICAL HISTORY: Past Surgical History  Procedure Laterality Date  . Appendectomy    . Cholecystectomy    . Abdominal hysterectomy    . Tonsillectomy    . Colonoscopy    . Portacath placement N/A 06/30/2014    Procedure: INSERTION  PORT-A-CATH WITH ULTRA SOUND ON STANDBY;  Surgeon: Fanny Skates, MD;  Location: [facility];  Service: General;  Laterality: N/A;  . Endobronchial ultrasound Bilateral 07/28/2014    Procedure: ENDOBRONCHIAL ULTRASOUND;  Surgeon: Collene Gobble, MD;  Location: [facility];  Service: Cardiopulmonary;  Laterality: Bilateral;   SOCIAL HISTORY: Social History   Social History  . Marital Status: Married    Spouse Name: N/A  . Number of Children: N/A  . Years of Education: N/A   Occupational History  . Not on file.   Social History Main Topics  . Smoking status: Former Smoker -- 1.00 packs/day for 18 years    Quit date: 03/28/1988  . Smokeless tobacco: Not on file  . Alcohol Use: No  . Drug Use: No  . Sexual Activity: Not on file   Other Topics Concern  . Not on file   Social History Narrative     GYN HISTORY  Menarchal: 11 LMP: 1994, hysrectomy  Contraceptive: no  HRT: since 45  G2P1, one miscarriage     FAMILY HISTORY: Family History  Problem Relation Age of Onset  . Cancer Father 58    gastric cancer   . Cancer Sister 81    lung cancer     ALLERGIES:  is allergic to aspirin; codeine; diclofenac; meloxicam; nitrofurantoin; other; pyridium; sulfa antibiotics; tetracyclines & related; and tape.  MEDICATIONS:  Current Outpatient Prescriptions  Medication Sig Dispense Refill  . aspirin EC 81 MG tablet Take 81 mg by mouth daily.     . calcium carbonate (OS-CAL) 600 MG TABS tablet Take 600 mg by mouth 2 (two) times daily with a meal.    . cyanocobalamin (,VITAMIN B-12,) 1000 MCG/ML injection Inject 1,000 mcg into the muscle every 30 (thirty) days.     Marland Kitchen HYDROcodone-acetaminophen (NORCO) 5-325 MG per tablet Take 1-2 tablets by mouth every 6 (six) hours as needed for moderate pain or severe pain. 30 tablet 0  . levETIRAcetam (KEPPRA) 500 MG tablet Take 1 tablet by mouth 2 (two) times daily.    Marland Kitchen lidocaine-prilocaine (EMLA) cream Apply 1 application  topically as needed. Apply to portacath  1 hour to 1 1/2 hours prior to procedures as needed.   Cover with clear plastic. 30 g 1  . oxaprozin (DAYPRO) 600 MG tablet Take 1 tablet by mouth 2 (two) times daily.    . phenytoin (DILANTIN) 100 MG ER capsule Take 1 tablet by mouth 4 (four) times daily. On  Sat , Sun, Tues, Wed, and  Thursdays    -  Take   190m  QID.    .Marland Kitchensodium chloride (OCEAN) 0.65 % SOLN nasal spray Place 1 spray into both nostrils as needed for congestion.    .Marland Kitchenzolpidem (AMBIEN) 5 MG tablet Take 1 tablet (5 mg total) by mouth at bedtime as needed for sleep. 30 tablet 0  . traMADol (ULTRAM) 50 MG tablet      No current facility-administered medications for this visit.    REVIEW OF SYSTEMS:   Constitutional: Denies fevers, chills or abnormal  night sweats Eyes: Denies blurriness of vision, double vision or watery eyes Ears, nose, mouth, throat, and face: Denies mucositis or sore throat Respiratory: Denies cough, dyspnea or wheezes Cardiovascular: Denies palpitation, chest discomfort or lower extremity swelling Gastrointestinal:  Denies nausea, heartburn or change in bowel habits Skin: Denies abnormal skin rashes Lymphatics: Denies new lymphadenopathy or easy bruising Neurological:Denies numbness, tingling or new weaknesses Behavioral/Psych: Mood is stable, no new changes  All other systems were reviewed with the patient and are negative.  PHYSICAL EXAMINATION: ECOG PERFORMANCE STATUS: 1  BP 125/75 mmHg  Pulse 72  Temp(Src) 97.8 F (36.6 C) (Oral)  Resp 18  Ht '5\' 4"'  (1.626 m)  Wt 169 lb 11.2 oz (76.975 kg)  BMI 29.11 kg/m2  SpO2 100%  GENERAL:alert, no distress and comfortable SKIN: skin color, texture, turgor are normal, no rashes or significant lesions EYES: normal, conjunctiva are pink and non-injected, sclera clear OROPHARYNX:no exudate, no erythema and lips, buccal mucosa, and tongue normal  NECK: supple, thyroid normal size, non-tender, without  nodularity LYMPH:  no palpable lymphadenopathy in the cervical, axillary or inguinal LUNGS: clear to auscultation and percussion with normal breathing effort HEART: regular rate & rhythm and no murmurs and no lower extremity edema ABDOMEN:abdomen soft, non-tender and normal bowel sounds Musculoskeletal:no cyanosis of digits and no clubbing  PSYCH: alert & oriented x 3 with fluent speech NEURO: no focal motor/sensory deficits Breasts: Breast inspection showed them to be symmetrical with no nipple discharge.  The previous palpable mass (initially 4.5X6.0cm) mass in the 10 clock positionof her right breast is about 1X1.5cm now. Palpation of the left breasts and axilla revealed no obvious mass that I could appreciate.   weight on LABORATORY DATA:  I have reviewed the data as listed CBC Latest Ref Rng 12/05/2014 11/28/2014 11/21/2014  WBC 3.9 - 10.3 10e3/uL 2.9(L) 2.1(L) 2.5(L)  Hemoglobin 11.6 - 15.9 g/dL 10.7(L) 10.5(L) 10.0(L)  Hematocrit 34.8 - 46.6 % 30.9(L) 30.3(L) 28.7(L)  Platelets 145 - 400 10e3/uL 196 156 172    CMP Latest Ref Rng 12/05/2014 11/28/2014 11/21/2014  Glucose 70 - 140 mg/dl 85 99 82  BUN 7.0 - 26.0 mg/dL 14.1 13.9 15.1  Creatinine 0.6 - 1.1 mg/dL 0.7 0.7 0.6  Sodium 136 - 145 mEq/L 140 140 138  Potassium 3.5 - 5.1 mEq/L 3.7 3.6 4.1  Chloride 96 - 112 mmol/L - - -  CO2 22 - 29 mEq/L '27 27 26  ' Calcium 8.4 - 10.4 mg/dL 9.2 9.2 9.0  Total Protein 6.4 - 8.3 g/dL 6.8 7.1 6.6  Total Bilirubin 0.20 - 1.20 mg/dL 0.32 0.25 <0.20  Alkaline Phos 40 - 150 U/L 110 98 106  AST 5 - 34 U/L '15 18 15  ' ALT 0 - 55 U/L '13 13 12      ' PATHOLOGY REPORT 06/05/2014 Diagnosis Breast, right, needle core biopsy - INVASIVE MAMMARY CARCINOMA, SEE COMMENT. Microscopic Comment Although the grade of tumor is best assessed at resection, with these biopsies, the invasive tumor is grade III. The tumor expresses cytokeratin AE1/3 immunostain and is negative for S-100 and melanin A immunostains.  Breast prognostic studies  Results: Estrogen Receptor: 0%, NEGATIVE Progesterone Receptor: 0%, NEGATIVE Proliferation Marker Ki67: 98% COMMENT: The negative hormone receptor study(ies) in this case have an internal positive control.  HER-2/NEU BY CISH - NEGATIVE. RESULT RATIO OF HER2: CEP 17 SIGNALS 1.31 AVERAGE HER2 COPY NUMBER PER CELL 2.10  Diagnosis Breast, left, needle core biopsy, UOQ - DUCTAL CARCINOMA IN-SITU. - SEE COMMENT. Microscopic  Comment The ductal carcinoma in situ is low grade. The carcinoma cells are positive for e-cadherin and cytokeratin AE1/AE3. Smooth muscle myosin, calponin, and p63 stains highlight the presence of myoepithelial cells. Results: IMMUNOHISTOCHEMICAL AND MORPHOMETRIC ANALYSIS BY THE AUTOMATED CELLULAR IMAGING SYSTEM (ACIS) Estrogen Receptor: 100%, POSITIVE, STRONG STAINING INTENSITY Progesterone Receptor: 28%, POSITIVE, WEAK STAINING INTENSITY  Diagnosis 07/10/2014 Lymph node, needle/core biopsy, Left supraclavicular - BENIGN REACTIVE LYMPH NODE TISSUE. - NO METASTATIC CARCINOMA IDENTIFIED.  Diagnosis 07/28/2014  FINE NEEDLE ASPIRATION, ENDOSCOPIC, 10R LYMPH NODE(SPECIMEN 1 OF 3 COLLECTED (07/28/14): NO MALIGNANT CELLS IDENTIFIED. MIXED LYMPHOID POPULATION.  RADIOGRAPHIC STUDIES: I have personally reviewed the radiological images as listed and agreed with the findings in the report.  ASSESSMENT & PLAN:  69 year old postmenopausal woman, with past medical history of seizure and arthritis, presented with a palpable mass in the inner upper quadrant of her right breast. Biopsy revealed poorly differentiated carcinoma, triple negative.  1. 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 -The PET scan showed hypermetabolic lymph nodes in the left supraclavicular,  left cervical level II, right hilar and a periportal area. Although they are suspicious for distant note metastasis, both left [place] and right hilar node biopsy were negaative, so likely reactive adenopathy. -I discussed the [place] node and right hilar node biopsy which showed benign reactive lymph tissue -we have discussed her case in our breast tumor board, the consensus is proceed with neoadjuvant chemotherapy, followed by surgery and radiation. -I recommended to start chemotherapy with dose dense Adriamycin and Cytoxan X4, followed by by biweekly TaxolX4, to maximally control her breast cancer. She agrees -She has completed 4 cycles of dose dense AC, however her breast mass has only shunk by 20-30%. Given the suboptimal response to [facility], I recommend the next chemotherapy to be weekly carbo and Taxol, giving her triple negative disease. Potential side effects from [place] and Taxol were discussed with patient, she agrees to proceed. -will repeat PET scan in early Oct  -She is tolerating weekly chemotherapy well, we'll continue. Her right breast mass is getting smaller, good clinical response -lab reviewed, neutropenia improved, ANC 1.3 today, we'll proceed cycle 10 carbo and Taxol today  2. Neutropenia and anemia -Secondary to chemotherapy. -adequate for chemo today  -continue close monitoring   3. Arthritis -Worsening with the chemotherapy -She will continue use tramadol as needed  Plan: -Week 10 chemo with  carbo and Taxol today, continue weekly for 2 more weeks -I'll see her back in 2 weeks. -breast MRI on 9/20, she will see Dr. Dalbert Batman on 9/26.  -will schedule her PET in early Oct   All questions were answered. The patient knows to call the clinic with any problems, questions or concerns. I spent 20 minutes counseling the patient face to face. The total time spent in the appointment was 30 minutes and more than 50% was on counseling.   We'll continue  Truitt Merle, MD 12/05/2014 9:39 AM

## 2014-12-08 ENCOUNTER — Inpatient Hospital Stay: Admission: RE | Admit: 2014-12-08 | Payer: Medicare Other | Source: Ambulatory Visit

## 2014-12-12 ENCOUNTER — Ambulatory Visit (HOSPITAL_BASED_OUTPATIENT_CLINIC_OR_DEPARTMENT_OTHER): Payer: Medicare Other

## 2014-12-12 ENCOUNTER — Telehealth: Payer: Self-pay | Admitting: *Deleted

## 2014-12-12 ENCOUNTER — Other Ambulatory Visit (HOSPITAL_BASED_OUTPATIENT_CLINIC_OR_DEPARTMENT_OTHER): Payer: Medicare Other

## 2014-12-12 ENCOUNTER — Other Ambulatory Visit: Payer: Self-pay | Admitting: Hematology

## 2014-12-12 VITALS — BP 120/70 | HR 75 | Temp 97.0°F | Resp 18

## 2014-12-12 DIAGNOSIS — Z5111 Encounter for antineoplastic chemotherapy: Secondary | ICD-10-CM

## 2014-12-12 DIAGNOSIS — C50211 Malignant neoplasm of upper-inner quadrant of right female breast: Secondary | ICD-10-CM

## 2014-12-12 LAB — COMPREHENSIVE METABOLIC PANEL (CC13)
ALT: 10 U/L (ref 0–55)
AST: 15 U/L (ref 5–34)
Albumin: 3.4 g/dL — ABNORMAL LOW (ref 3.5–5.0)
Alkaline Phosphatase: 114 U/L (ref 40–150)
Anion Gap: 9 mEq/L (ref 3–11)
BUN: 13 mg/dL (ref 7.0–26.0)
CO2: 28 mEq/L (ref 22–29)
Calcium: 9.2 mg/dL (ref 8.4–10.4)
Chloride: 103 mEq/L (ref 98–109)
Creatinine: 0.7 mg/dL (ref 0.6–1.1)
EGFR: 88 mL/min/{1.73_m2} — ABNORMAL LOW (ref >90)
Glucose: 93 mg/dl (ref 70–140)
Potassium: 4.3 mEq/L (ref 3.5–5.1)
Sodium: 141 mEq/L (ref 136–145)
Total Bilirubin: <0.2 mg/dL (ref 0.20–1.20)
Total Protein: 6.9 g/dL (ref 6.4–8.3)

## 2014-12-12 LAB — CBC WITH DIFFERENTIAL/PLATELET
BASO%: 1.3 % (ref 0.0–2.0)
Basophils Absolute: 0 10*3/uL (ref 0.0–0.1)
EOS%: 3.5 % (ref 0.0–7.0)
Eosinophils Absolute: 0.1 10*3/uL (ref 0.0–0.5)
HCT: 30.7 % — ABNORMAL LOW (ref 34.8–46.6)
HGB: 10.8 g/dL — ABNORMAL LOW (ref 11.6–15.9)
LYMPH%: 26.9 % (ref 14.0–49.7)
MCH: 36.6 pg — ABNORMAL HIGH (ref 25.1–34.0)
MCHC: 35.3 g/dL (ref 31.5–36.0)
MCV: 103.8 fL — ABNORMAL HIGH (ref 79.5–101.0)
MONO#: 0.4 10*3/uL (ref 0.1–0.9)
MONO%: 13.8 % (ref 0.0–14.0)
NEUT#: 1.7 10*3/uL (ref 1.5–6.5)
NEUT%: 54.5 % (ref 38.4–76.8)
Platelets: 178 10*3/uL (ref 145–400)
RBC: 2.96 10*6/uL — ABNORMAL LOW (ref 3.70–5.45)
RDW: 16.1 % — ABNORMAL HIGH (ref 11.2–14.5)
WBC: 3.1 10*3/uL — ABNORMAL LOW (ref 3.9–10.3)
lymph#: 0.8 10*3/uL — ABNORMAL LOW (ref 0.9–3.3)

## 2014-12-12 MED ORDER — DIPHENHYDRAMINE HCL 50 MG/ML IJ SOLN
25.0000 mg | Freq: Once | INTRAMUSCULAR | Status: AC
Start: 2014-12-12 — End: 2014-12-12
  Administered 2014-12-12: 25 mg via INTRAVENOUS

## 2014-12-12 MED ORDER — SODIUM CHLORIDE 0.9 % IV SOLN
171.2000 mg | Freq: Once | INTRAVENOUS | Status: AC
  Administered 2014-12-12: 170 mg via INTRAVENOUS
  Filled 2014-12-12: qty 17

## 2014-12-12 MED ORDER — PACLITAXEL CHEMO INJECTION 300 MG/50ML
80.0000 mg/m2 | Freq: Once | INTRAVENOUS | Status: AC
  Administered 2014-12-12: 144 mg via INTRAVENOUS
  Filled 2014-12-12: qty 24

## 2014-12-12 MED ORDER — SODIUM CHLORIDE 0.9 % IV SOLN
Freq: Once | INTRAVENOUS | Status: AC
  Administered 2014-12-12: 10:00:00 via INTRAVENOUS

## 2014-12-12 MED ORDER — DIPHENHYDRAMINE HCL 50 MG/ML IJ SOLN
INTRAMUSCULAR | Status: AC
  Filled 2014-12-12: qty 1

## 2014-12-12 MED ORDER — HEPARIN SOD (PORK) LOCK FLUSH 100 UNIT/ML IV SOLN
500.0000 [IU] | Freq: Once | INTRAVENOUS | Status: AC | PRN
  Administered 2014-12-12: 500 [IU]
  Filled 2014-12-12: qty 5

## 2014-12-12 MED ORDER — SODIUM CHLORIDE 0.9 % IJ SOLN
10.0000 mL | INTRAMUSCULAR | Status: DC | PRN
  Administered 2014-12-12: 10 mL
  Filled 2014-12-12: qty 10

## 2014-12-12 MED ORDER — FAMOTIDINE IN NACL 20-0.9 MG/50ML-% IV SOLN
20.0000 mg | Freq: Once | INTRAVENOUS | Status: AC
  Administered 2014-12-12: 20 mg via INTRAVENOUS

## 2014-12-12 MED ORDER — FAMOTIDINE IN NACL 20-0.9 MG/50ML-% IV SOLN
INTRAVENOUS | Status: AC
Start: 2014-12-12 — End: 2014-12-12
  Filled 2014-12-12: qty 50

## 2014-12-12 MED ORDER — SODIUM CHLORIDE 0.9 % IV SOLN
Freq: Once | INTRAVENOUS | Status: AC
  Administered 2014-12-12: 11:00:00 via INTRAVENOUS
  Filled 2014-12-12: qty 8

## 2014-12-12 NOTE — Telephone Encounter (Signed)
Note faxed to [facility] saying patient was here today for labs and treatment.  Fax is [phone]

## 2014-12-12 NOTE — Patient Instructions (Signed)
[facility] Discharge Instructions for Patients Receiving Chemotherapy  Today you received the following chemotherapy agents: Taxol and Carboplatin.   To help prevent nausea and vomiting after your treatment, we encourage you to take your nausea medication as needed.   If you develop nausea and vomiting that is not controlled by your nausea medication, call the clinic.   BELOW ARE SYMPTOMS THAT SHOULD BE REPORTED IMMEDIATELY:  *FEVER GREATER THAN 100.5 F  *CHILLS WITH OR WITHOUT FEVER  NAUSEA AND VOMITING THAT IS NOT CONTROLLED WITH YOUR NAUSEA MEDICATION  *UNUSUAL SHORTNESS OF BREATH  *UNUSUAL BRUISING OR BLEEDING  TENDERNESS IN MOUTH AND THROAT WITH OR WITHOUT PRESENCE OF ULCERS  *URINARY PROBLEMS  *BOWEL PROBLEMS  UNUSUAL RASH Items with * indicate a potential emergency and should be followed up as soon as possible.  Feel free to call the clinic should you have any questions or concerns. The clinic phone number is [phone].  Please show the [facility] at check-in to the Emergency Department and triage nurse.

## 2014-12-16 ENCOUNTER — Other Ambulatory Visit: Payer: Medicare Other

## 2014-12-16 ENCOUNTER — Ambulatory Visit
Admission: RE | Admit: 2014-12-16 | Discharge: 2014-12-16 | Disposition: A | Discharge status: Home / Self Care | Payer: Medicare Other | Source: Ambulatory Visit | Attending: Hematology | Admitting: Hematology

## 2014-12-16 DIAGNOSIS — N63 Unspecified lump in breast: Secondary | ICD-10-CM | POA: Diagnosis not present

## 2014-12-16 DIAGNOSIS — C50211 Malignant neoplasm of upper-inner quadrant of right female breast: Secondary | ICD-10-CM

## 2014-12-16 MED ORDER — GADOBENATE DIMEGLUMINE 529 MG/ML IV SOLN
15.0000 mL | Freq: Once | INTRAVENOUS | Status: AC | PRN
  Administered 2014-12-16: 15 mL via INTRAVENOUS

## 2014-12-19 ENCOUNTER — Encounter: Payer: Self-pay | Admitting: Hematology

## 2014-12-19 ENCOUNTER — Ambulatory Visit (HOSPITAL_BASED_OUTPATIENT_CLINIC_OR_DEPARTMENT_OTHER): Payer: Medicare Other

## 2014-12-19 ENCOUNTER — Telehealth: Payer: Self-pay | Admitting: Hematology

## 2014-12-19 ENCOUNTER — Other Ambulatory Visit (HOSPITAL_BASED_OUTPATIENT_CLINIC_OR_DEPARTMENT_OTHER): Payer: Medicare Other

## 2014-12-19 ENCOUNTER — Telehealth: Payer: Self-pay | Admitting: *Deleted

## 2014-12-19 ENCOUNTER — Ambulatory Visit (HOSPITAL_BASED_OUTPATIENT_CLINIC_OR_DEPARTMENT_OTHER): Payer: Medicare Other | Admitting: Hematology

## 2014-12-19 VITALS — BP 117/66 | HR 81 | Temp 98.0°F | Resp 18 | Ht 64.0 in | Wt 168.1 lb

## 2014-12-19 DIAGNOSIS — C50211 Malignant neoplasm of upper-inner quadrant of right female breast: Secondary | ICD-10-CM | POA: Diagnosis not present

## 2014-12-19 DIAGNOSIS — D6481 Anemia due to antineoplastic chemotherapy: Secondary | ICD-10-CM | POA: Diagnosis not present

## 2014-12-19 DIAGNOSIS — Z5111 Encounter for antineoplastic chemotherapy: Secondary | ICD-10-CM | POA: Diagnosis present

## 2014-12-19 DIAGNOSIS — D6959 Other secondary thrombocytopenia: Secondary | ICD-10-CM | POA: Diagnosis not present

## 2014-12-19 LAB — CBC WITH DIFFERENTIAL/PLATELET
BASO%: 0.6 % (ref 0.0–2.0)
Basophils Absolute: 0 10*3/uL (ref 0.0–0.1)
EOS%: 4.5 % (ref 0.0–7.0)
Eosinophils Absolute: 0.2 10*3/uL (ref 0.0–0.5)
HCT: 29.7 % — ABNORMAL LOW (ref 34.8–46.6)
HGB: 10.1 g/dL — ABNORMAL LOW (ref 11.6–15.9)
LYMPH%: 30.4 % (ref 14.0–49.7)
MCH: 34.8 pg — ABNORMAL HIGH (ref 25.1–34.0)
MCHC: 34 g/dL (ref 31.5–36.0)
MCV: 102.4 fL — ABNORMAL HIGH (ref 79.5–101.0)
MONO#: 0.5 10*3/uL (ref 0.1–0.9)
MONO%: 14.2 % — ABNORMAL HIGH (ref 0.0–14.0)
NEUT#: 1.8 10*3/uL (ref 1.5–6.5)
NEUT%: 50.3 % (ref 38.4–76.8)
Platelets: 147 10*3/uL (ref 145–400)
RBC: 2.9 10*6/uL — ABNORMAL LOW (ref 3.70–5.45)
RDW: 14.9 % — ABNORMAL HIGH (ref 11.2–14.5)
WBC: 3.6 10*3/uL — ABNORMAL LOW (ref 3.9–10.3)
lymph#: 1.1 10*3/uL (ref 0.9–3.3)
nRBC: 0 % (ref 0–0)

## 2014-12-19 LAB — COMPREHENSIVE METABOLIC PANEL (CC13)
ALT: 11 U/L (ref 0–55)
AST: 15 U/L (ref 5–34)
Albumin: 3.5 g/dL (ref 3.5–5.0)
Alkaline Phosphatase: 99 U/L (ref 40–150)
Anion Gap: 9 mEq/L (ref 3–11)
BUN: 19.5 mg/dL (ref 7.0–26.0)
CO2: 26 mEq/L (ref 22–29)
Calcium: 8.7 mg/dL (ref 8.4–10.4)
Chloride: 104 mEq/L (ref 98–109)
Creatinine: 0.7 mg/dL (ref 0.6–1.1)
EGFR: 83 mL/min/{1.73_m2} — ABNORMAL LOW (ref >90)
Glucose: 103 mg/dl (ref 70–140)
Potassium: 3.6 mEq/L (ref 3.5–5.1)
Sodium: 139 mEq/L (ref 136–145)
Total Bilirubin: <0.3 mg/dL (ref 0.20–1.20)
Total Protein: 6.9 g/dL (ref 6.4–8.3)

## 2014-12-19 MED ORDER — SODIUM CHLORIDE 0.9 % IV SOLN
171.2000 mg | Freq: Once | INTRAVENOUS | Status: AC
  Administered 2014-12-19: 170 mg via INTRAVENOUS
  Filled 2014-12-19: qty 17

## 2014-12-19 MED ORDER — FAMOTIDINE IN NACL 20-0.9 MG/50ML-% IV SOLN
20.0000 mg | Freq: Once | INTRAVENOUS | Status: AC
  Administered 2014-12-19: 20 mg via INTRAVENOUS

## 2014-12-19 MED ORDER — DIPHENHYDRAMINE HCL 50 MG/ML IJ SOLN
INTRAMUSCULAR | Status: AC
  Filled 2014-12-19: qty 1

## 2014-12-19 MED ORDER — FAMOTIDINE IN NACL 20-0.9 MG/50ML-% IV SOLN
INTRAVENOUS | Status: AC
  Filled 2014-12-19: qty 50

## 2014-12-19 MED ORDER — PACLITAXEL CHEMO INJECTION 300 MG/50ML
80.0000 mg/m2 | Freq: Once | INTRAVENOUS | Status: AC
  Administered 2014-12-19: 144 mg via INTRAVENOUS
  Filled 2014-12-19: qty 24

## 2014-12-19 MED ORDER — HEPARIN SOD (PORK) LOCK FLUSH 100 UNIT/ML IV SOLN
500.0000 [IU] | Freq: Once | INTRAVENOUS | Status: AC | PRN
  Administered 2014-12-19: 500 [IU]
  Filled 2014-12-19: qty 5

## 2014-12-19 MED ORDER — SODIUM CHLORIDE 0.9 % IV SOLN
Freq: Once | INTRAVENOUS | Status: AC
  Administered 2014-12-19: 15:00:00 via INTRAVENOUS

## 2014-12-19 MED ORDER — SODIUM CHLORIDE 0.9 % IJ SOLN
10.0000 mL | INTRAMUSCULAR | Status: DC | PRN
  Administered 2014-12-19: 10 mL
  Filled 2014-12-19: qty 10

## 2014-12-19 MED ORDER — DEXAMETHASONE SODIUM PHOSPHATE 100 MG/10ML IJ SOLN
Freq: Once | INTRAMUSCULAR | Status: AC
  Administered 2014-12-19: 16:00:00 via INTRAVENOUS
  Filled 2014-12-19: qty 8

## 2014-12-19 MED ORDER — DIPHENHYDRAMINE HCL 50 MG/ML IJ SOLN
25.0000 mg | Freq: Once | INTRAMUSCULAR | Status: AC
  Administered 2014-12-19: 25 mg via INTRAVENOUS

## 2014-12-19 NOTE — Telephone Encounter (Signed)
Gave avs & calendar for October. °

## 2014-12-19 NOTE — Patient Instructions (Signed)
[facility] Discharge Instructions for Patients Receiving Chemotherapy  Today you received the following chemotherapy agents Carboplatin/Taxol  To help prevent nausea and vomiting after your treatment, we encourage you to take your nausea medication  If you develop nausea and vomiting that is not controlled by your nausea medication, call the clinic.   BELOW ARE SYMPTOMS THAT SHOULD BE REPORTED IMMEDIATELY:  *FEVER GREATER THAN 100.5 F  *CHILLS WITH OR WITHOUT FEVER  NAUSEA AND VOMITING THAT IS NOT CONTROLLED WITH YOUR NAUSEA MEDICATION  *UNUSUAL SHORTNESS OF BREATH  *UNUSUAL BRUISING OR BLEEDING  TENDERNESS IN MOUTH AND THROAT WITH OR WITHOUT PRESENCE OF ULCERS  *URINARY PROBLEMS  *BOWEL PROBLEMS  UNUSUAL RASH Items with * indicate a potential emergency and should be followed up as soon as possible.  Feel free to call the clinic you have any questions or concerns. The clinic phone number is [phone].  Please show the [facility] at check-in to the Emergency Department and triage nurse.

## 2014-12-19 NOTE — Progress Notes (Signed)
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 Ultrasound of the right axilla is negative   06/05/2014 Pathology Results INVASIVE MAMMARY CARCINOMA   06/05/2014 Receptors her2 Estrogen Receptor: 0%, NEGATIVE Progesterone Receptor: 0%, NEGATIVE Proliferation Marker Ki67: 98%  HER-2/NEU BY CISH - NEGATIVE   06/11/2014 Initial Diagnosis Breast cancer of upper-inner quadrant of right female breast   07/02/2014 PET scan 1. Intensely hypermetabolic mass in the medial RIGHT chest wall with hypermetabolic right axillary node 2. Hypermetabolic nodal metastasis in the LEFT level 2 and LEFT supraclavicular and periportal node (SUV 5).    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   07/28/2014 Pathology Results Right hilar and are lymph node biopsy showed no malignant cells, mixed lymphoid population.   08/11/2014 Miscellaneous nadir CBC: WBC 0.54, hemoglobin 8.9, hematocrit 26%, platelet 125    HISTORY OF PRESENTING ILLNESS:  Kristin Pennington 69 y.o. female is here because of recently diagnosed poorly differentiated carcinoma in her right breast.  She found a lump in her right breast 3 weeks ago, mild tenderness,  No other complains. Last mammo was in oct 2015 which was negative. She feels well overall. She denies any pain, cough, shortness breast, GI symptoms. She has good energy level and appetite, no recent weight loss.  Her last colonoscopy was for 5 years ago which was negative per patient. Her last Pap smear was 3 years ago which was negative per patient.  INTERIM HISTORY: Kristin Pennington returns for follow-up and last cycle chemo. She has been tolerating the chemotherapy well, mild fatigue, no significant nausea, diarrhea or other symptoms.    MEDICAL HISTORY:  Past Medical History  Diagnosis Date  . Wears glasses   . Wears partial dentures     top  . 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 Arthritis     knee  . Breast cancer     bilateral, right breast mass, cancer both breast, x1 chemo  a week ago.     SURGICAL HISTORY: Past Surgical History  Procedure Laterality Date  . Appendectomy    . Cholecystectomy    . Abdominal hysterectomy    .  Tonsillectomy    . Colonoscopy    . Portacath placement N/A 06/30/2014    Procedure: INSERTION PORT-A-CATH WITH ULTRA SOUND ON STANDBY;  Surgeon: Fanny Skates, MD;  Location: [facility];  Service: General;  Laterality: N/A;  . Endobronchial ultrasound Bilateral 07/28/2014    Procedure: ENDOBRONCHIAL ULTRASOUND;  Surgeon: Collene Gobble, MD;  Location: [facility];  Service: Cardiopulmonary;   Laterality: Bilateral;   SOCIAL HISTORY: Social History   Social History  . Marital Status: Married    Spouse Name: N/A  . Number of Children: N/A  . Years of Education: N/A   Occupational History  . Not on file.   Social History Main Topics  . Smoking status: Former Smoker -- 1.00 packs/day for 18 years    Quit date: 03/28/1988  . Smokeless tobacco: Not on file  . Alcohol Use: No  . Drug Use: No  . Sexual Activity: Not on file   Other Topics Concern  . Not on file   Social History Narrative     GYN HISTORY  Menarchal: 11 LMP: 1994, hysrectomy  Contraceptive: no  HRT: since 69  G2P1, one miscarriage     FAMILY HISTORY: Family History  Problem Relation Age of Onset  . Cancer Father 79    gastric cancer   . Cancer Sister 30    lung cancer     ALLERGIES:  is allergic to aspirin; codeine; diclofenac; meloxicam; nitrofurantoin; other; pyridium; sulfa antibiotics; tetracyclines & related; and tape.  MEDICATIONS:  Current Outpatient Prescriptions  Medication Sig Dispense Refill  . aspirin EC 81 MG tablet Take 81 mg by mouth daily.     . calcium carbonate (OS-CAL) 600 MG TABS tablet Take 600 mg by mouth 2 (two) times daily with a meal.    . cyanocobalamin (,VITAMIN B-12,) 1000 MCG/ML injection Inject 1,000 mcg into the muscle every 30 (thirty) days.     Marland Kitchen HYDROcodone-acetaminophen (NORCO) 5-325 MG per tablet Take 1-2 tablets by mouth every 6 (six) hours as needed for moderate pain or severe pain. 30 tablet 0  . levETIRAcetam (KEPPRA) 500 MG tablet Take 1 tablet by mouth 2 (two) times daily.    Marland Kitchen lidocaine-prilocaine (EMLA) cream Apply 1 application topically as needed. Apply to portacath  1 hour to 1 1/2 hours prior to procedures as needed.   Cover with clear plastic. 30 g 1  . oxaprozin (DAYPRO) 600 MG tablet Take 1 tablet by mouth 2 (two) times daily.    . phenytoin (DILANTIN) 100 MG ER capsule Take 1 tablet by mouth 4 (four) times daily. On  Sat , Sun, Tues,  Wed, and  Thursdays    -  Take   129m  QID.    .Marland Kitchensodium chloride (OCEAN) 0.65 % SOLN nasal spray Place 1 spray into both nostrils as needed for congestion.    . traMADol (ULTRAM) 50 MG tablet     . zolpidem (AMBIEN) 5 MG tablet Take 1 tablet (5 mg total) by mouth at bedtime as needed for sleep. 30 tablet 0   No current facility-administered medications for this visit.    REVIEW OF SYSTEMS:   Constitutional: Denies fevers, chills or abnormal night sweats Eyes: Denies blurriness of vision, double vision or watery eyes Ears, nose, mouth, throat, and face: Denies mucositis or sore throat Respiratory: Denies cough, dyspnea or wheezes Cardiovascular: Denies palpitation, chest discomfort or lower extremity swelling Gastrointestinal:  Denies nausea, heartburn or change in bowel habits Skin: Denies abnormal skin rashes  Lymphatics: Denies new lymphadenopathy or easy bruising Neurological:Denies numbness, tingling or new weaknesses Behavioral/Psych: Mood is stable, no new changes  All other systems were reviewed with the patient and are negative.  PHYSICAL EXAMINATION: ECOG PERFORMANCE STATUS: 1  BP 117/66 mmHg  Pulse 81  Temp(Src) 98 F (36.7 C) (Oral)  Resp 18  Ht _0  (1.626 m)  Wt 168 lb 1.6 oz (76.25 kg)  BMI 28.84 kg/m2  SpO2 99%  GENERAL:alert, no distress and comfortable SKIN: skin color, texture, turgor are normal, no rashes or significant lesions EYES: normal, conjunctiva are pink and non-injected, sclera clear OROPHARYNX:no exudate, no erythema and lips, buccal mucosa, and tongue normal  NECK: supple, thyroid normal size, non-tender, without nodularity LYMPH:  no palpable lymphadenopathy in the cervical, axillary or inguinal LUNGS: clear to auscultation and percussion with normal breathing effort HEART: regular rate & rhythm and no murmurs and no lower extremity edema ABDOMEN:abdomen soft, non-tender and normal bowel sounds Musculoskeletal:no cyanosis of digits and no  clubbing  PSYCH: alert & oriented x 3 with fluent speech NEURO: no focal motor/sensory deficits Breasts: Breast inspection showed them to be symmetrical with no nipple discharge.  The previous palpable mass (initially 4.5X6.0cm) mass in the 10 clock positionof her right breast is about 1X1.5cm now. Palpation of the left breasts and axilla revealed no obvious mass that I could appreciate.   weight on LABORATORY DATA:  I have reviewed the data as listed CBC Latest Ref Rng 12/19/2014 12/12/2014 12/05/2014  WBC 3.9 - 10.3 10e3/uL 3.6(L) 3.1(L) 2.9(L)  Hemoglobin 11.6 - 15.9 g/dL 10.1(L) 10.8(L) 10.7(L)  Hematocrit 34.8 - 46.6 % 29.7(L) 30.7(L) 30.9(L)  Platelets 145 - 400 10e3/uL 147 178 196    CMP Latest Ref Rng 12/19/2014 12/12/2014 12/05/2014  Glucose 70 - 140 mg/dl 103 93 85  BUN 7.0 - 26.0 mg/dL 19.5 13.0 14.1  Creatinine 0.6 - 1.1 mg/dL 0.7 0.7 0.7  Sodium 136 - 145 mEq/L 139 141 140  Potassium 3.5 - 5.1 mEq/L 3.6 4.3 3.7  Chloride 96 - 112 mmol/L - - -  CO2 22 - 29 mEq/L _1 Calcium 8.4 - 10.4 mg/dL 8.7 9.2 9.2  Total Protein 6.4 - 8.3 g/dL 6.9 6.9 6.8  Total Bilirubin 0.20 - 1.20 mg/dL <0.30 <0.20 0.32  Alkaline Phos 40 - 150 U/L 99 114 110  AST 5 - 34 U/L _2 ALT 0 - 55 U/L _3 PATHOLOGY REPORT 06/05/2014 Diagnosis Breast, right, needle core biopsy - INVASIVE MAMMARY CARCINOMA, SEE COMMENT. Microscopic Comment Although the grade of tumor is best assessed at resection, with these biopsies, the invasive tumor is grade III. The tumor expresses cytokeratin AE1/3 immunostain and is negative for S-100 and melanin A immunostains. Breast prognostic studies  Results: Estrogen Receptor: 0%, NEGATIVE Progesterone Receptor: 0%, NEGATIVE Proliferation Marker Ki67: 98% COMMENT: The negative hormone receptor study(ies) in this case have an internal positive control.  HER-2/NEU BY CISH - NEGATIVE. RESULT RATIO OF HER2: CEP 17 SIGNALS 1.31 AVERAGE HER2 COPY NUMBER  PER CELL 2.10  Diagnosis Breast, left, needle core biopsy, UOQ - DUCTAL CARCINOMA IN-SITU. - SEE COMMENT. Microscopic Comment The ductal carcinoma in situ is low grade. The carcinoma cells are positive for e-cadherin and cytokeratin AE1/AE3. Smooth muscle myosin, calponin, and p63 stains highlight the presence of myoepithelial cells. Results: IMMUNOHISTOCHEMICAL AND MORPHOMETRIC ANALYSIS BY THE AUTOMATED CELLULAR IMAGING SYSTEM (ACIS) Estrogen Receptor: 100%, POSITIVE, STRONG STAINING INTENSITY Progesterone Receptor:  28%, POSITIVE, WEAK STAINING INTENSITY  Diagnosis 07/10/2014 Lymph node, needle/core biopsy, Left supraclavicular - BENIGN REACTIVE LYMPH NODE TISSUE. - NO METASTATIC CARCINOMA IDENTIFIED.  Diagnosis 07/28/2014  FINE NEEDLE ASPIRATION, ENDOSCOPIC, 10R LYMPH NODE(SPECIMEN 1 OF 3 COLLECTED (07/28/14): NO MALIGNANT CELLS IDENTIFIED. MIXED LYMPHOID POPULATION.  RADIOGRAPHIC STUDIES: I have personally reviewed the radiological images as listed and agreed with the findings in the report.  Breast MRI 12/16/2014 Right breast: The previously seen enhancing irregular right breast mass located within the upper inner quadrant has decreased in size and there is markedly decreased enhancement associated with the mass post chemotherapy. On the non subtracted T1 images the mass now measures 2.2 x 1.9 x 1.1 cm in size and previously the mass measured 3.6 x 3.3 x 2.8 cm in size. There are no new enhancing lesions.  Left breast: The previously seen enhancement associated with the biopsy proven left breast DCIS has resolved. There are no new areas of enhancement.  Lymph nodes: No abnormal appearing lymph nodes.  Ancillary findings: None.  IMPRESSION: Decrease in size and enhancement of the mass (invasive mammary carcinoma) located within the upper inner quadrant of the right breast as discussed above. Also, resolution of previously seen enhancement located within the left  breast at the 1 o'clock position (DCIS). No evidence for adenopathy and no additional findings.   ASSESSMENT & PLAN:  69 year old postmenopausal woman, with past medical history of seizure and arthritis, presented with a palpable mass in the inner upper quadrant of her right breast. Biopsy revealed poorly differentiated carcinoma, triple negative.  1. 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 -The PET scan showed hypermetabolic lymph nodes in the left supraclavicular, left cervical level II, right hilar and a periportal area. Although they are suspicious for distant note metastasis, both left [place] and right hilar node biopsy were negaative, so likely reactive adenopathy. -we have discussed her case in our breast tumor board, the consensus is proceed with neoadjuvant chemotherapy, followed by surgery and radiation. -I recommended to start chemotherapy with dose dense Adriamycin and Cytoxan X4, followed by by biweekly TaxolX4, to maximally control her breast cancer. She agrees -She has completed 4 cycles of dose dense AC, however her breast mass has only shunk by 20-30%. Given the suboptimal response to [facility], I recommend the next chemotherapy to be weekly carbo and Taxol, giving her triple negative disease.  -She has been tolerating chemotherapy well overall, is finishing the last cycle carbotaxol today. -I discussed her repeated breast MRI, which showed partial response of the primary breast tumor, no adenopathy. No other new lesions. -will repeat PET scan in early Oct to evaluate her prior positive adenopathy  -Lab reviewed, adequate for treatment, we'll proceed with the last cycle carbo/taxol  2. Neutropenia and anemia -Secondary to chemotherapy. -adequate for chemo today  -continue close monitoring   3.  Arthritis -Worsening with the chemotherapy -She will continue use tramadol as needed  4. Genetic -She has multiple family members had breast cancer -She is interested in genetic testing to ruled out inheritable breast cancer, I'll refer her today, hope to get it done before her surgery.  Plan: -last cycle chemo today  -She is scheduled to see Dr. Dalbert Batman next week, possible surgery soon  -I'll see her back in 3 weeks with a repeated PET scan. -Genetic referral  All questions were answered. The patient knows to call the clinic with any problems, questions or concerns. I spent 20 minutes counseling the patient face to face. The total time spent in the appointment was 30 minutes and more than 50% was on counseling.   We'll continue  Truitt Merle, MD 12/19/2014 2:31 PM

## 2014-12-19 NOTE — Telephone Encounter (Signed)
Faxed letter to [facility] stating patient was here today for lab/MD visit and chemotherapy

## 2014-12-22 ENCOUNTER — Telehealth: Payer: Self-pay | Admitting: *Deleted

## 2014-12-22 ENCOUNTER — Other Ambulatory Visit: Payer: Self-pay | Admitting: Hematology

## 2014-12-22 NOTE — Telephone Encounter (Signed)
Called to congratulate pt on completion of chemotherapy. Relate doing well and without complaints. Scheduled and confirmed PET scan for 01/01/15 at 9:30AM. Confirmed f/u appt with Dr. Burr Medico and Santiago Glad for genetics. Encourage pt to call with needs. Received verbal understanding.

## 2014-12-22 NOTE — Telephone Encounter (Signed)
Unable to leave msg. Will call back again to assess needs after chemo.

## 2014-12-26 ENCOUNTER — Ambulatory Visit: Payer: Medicare Other | Admitting: Hematology

## 2014-12-26 ENCOUNTER — Other Ambulatory Visit: Payer: Self-pay | Admitting: General Surgery

## 2014-12-26 DIAGNOSIS — C50911 Malignant neoplasm of unspecified site of right female breast: Secondary | ICD-10-CM

## 2014-12-26 DIAGNOSIS — Z9079 Acquired absence of other genital organ(s): Secondary | ICD-10-CM | POA: Diagnosis not present

## 2014-12-26 DIAGNOSIS — C50912 Malignant neoplasm of unspecified site of left female breast: Principal | ICD-10-CM

## 2014-12-26 DIAGNOSIS — Z8669 Personal history of other diseases of the nervous system and sense organs: Secondary | ICD-10-CM | POA: Diagnosis not present

## 2014-12-26 DIAGNOSIS — C50412 Malignant neoplasm of upper-outer quadrant of left female breast: Secondary | ICD-10-CM | POA: Diagnosis not present

## 2014-12-26 DIAGNOSIS — C50211 Malignant neoplasm of upper-inner quadrant of right female breast: Secondary | ICD-10-CM | POA: Diagnosis not present

## 2014-12-26 DIAGNOSIS — Z90710 Acquired absence of both cervix and uterus: Secondary | ICD-10-CM | POA: Diagnosis not present

## 2014-12-26 DIAGNOSIS — Z90722 Acquired absence of ovaries, bilateral: Secondary | ICD-10-CM | POA: Diagnosis not present

## 2014-12-26 DIAGNOSIS — Z9049 Acquired absence of other specified parts of digestive tract: Secondary | ICD-10-CM | POA: Diagnosis not present

## 2015-01-01 ENCOUNTER — Ambulatory Visit (HOSPITAL_COMMUNITY)
Admission: RE | Admit: 2015-01-01 | Discharge: 2015-01-01 | Disposition: A | Discharge status: Home / Self Care | Payer: Medicare Other | Source: Ambulatory Visit | Attending: Hematology | Admitting: Hematology

## 2015-01-01 DIAGNOSIS — I2510 Atherosclerotic heart disease of native coronary artery without angina pectoris: Secondary | ICD-10-CM | POA: Diagnosis not present

## 2015-01-01 DIAGNOSIS — R599 Enlarged lymph nodes, unspecified: Secondary | ICD-10-CM | POA: Diagnosis not present

## 2015-01-01 DIAGNOSIS — I700 Atherosclerosis of aorta: Secondary | ICD-10-CM | POA: Diagnosis not present

## 2015-01-01 DIAGNOSIS — N281 Cyst of kidney, acquired: Secondary | ICD-10-CM | POA: Diagnosis not present

## 2015-01-01 DIAGNOSIS — C50911 Malignant neoplasm of unspecified site of right female breast: Secondary | ICD-10-CM | POA: Diagnosis not present

## 2015-01-01 DIAGNOSIS — C50211 Malignant neoplasm of upper-inner quadrant of right female breast: Secondary | ICD-10-CM | POA: Diagnosis not present

## 2015-01-01 LAB — GLUCOSE, CAPILLARY: Glucose-Capillary: 93 mg/dL (ref 65–99)

## 2015-01-01 MED ORDER — FLUDEOXYGLUCOSE F - 18 (FDG) INJECTION
11.3000 | Freq: Once | INTRAVENOUS | Status: DC | PRN
  Administered 2015-01-01: 11.3 via INTRAVENOUS
  Filled 2015-01-01: qty 11.3

## 2015-01-08 ENCOUNTER — Encounter: Payer: Self-pay | Admitting: Hematology

## 2015-01-08 ENCOUNTER — Ambulatory Visit (HOSPITAL_BASED_OUTPATIENT_CLINIC_OR_DEPARTMENT_OTHER): Payer: Medicare Other | Admitting: Hematology

## 2015-01-08 ENCOUNTER — Other Ambulatory Visit (HOSPITAL_BASED_OUTPATIENT_CLINIC_OR_DEPARTMENT_OTHER): Payer: Medicare Other

## 2015-01-08 ENCOUNTER — Other Ambulatory Visit: Payer: Self-pay | Admitting: General Surgery

## 2015-01-08 ENCOUNTER — Telehealth: Payer: Self-pay | Admitting: Hematology

## 2015-01-08 VITALS — BP 128/66 | HR 72 | Temp 96.8°F | Resp 20 | Ht 64.0 in | Wt 172.1 lb

## 2015-01-08 DIAGNOSIS — D701 Agranulocytosis secondary to cancer chemotherapy: Secondary | ICD-10-CM

## 2015-01-08 DIAGNOSIS — Z23 Encounter for immunization: Secondary | ICD-10-CM | POA: Diagnosis not present

## 2015-01-08 DIAGNOSIS — C50211 Malignant neoplasm of upper-inner quadrant of right female breast: Secondary | ICD-10-CM | POA: Diagnosis not present

## 2015-01-08 DIAGNOSIS — C50912 Malignant neoplasm of unspecified site of left female breast: Principal | ICD-10-CM

## 2015-01-08 DIAGNOSIS — D6481 Anemia due to antineoplastic chemotherapy: Secondary | ICD-10-CM

## 2015-01-08 DIAGNOSIS — C50911 Malignant neoplasm of unspecified site of right female breast: Secondary | ICD-10-CM

## 2015-01-08 LAB — CBC WITH DIFFERENTIAL/PLATELET
BASO%: 1.4 % (ref 0.0–2.0)
Basophils Absolute: 0 10*3/uL (ref 0.0–0.1)
EOS%: 4.3 % (ref 0.0–7.0)
Eosinophils Absolute: 0.1 10*3/uL (ref 0.0–0.5)
HCT: 32.6 % — ABNORMAL LOW (ref 34.8–46.6)
HGB: 11.2 g/dL — ABNORMAL LOW (ref 11.6–15.9)
LYMPH%: 25.9 % (ref 14.0–49.7)
MCH: 35.9 pg — ABNORMAL HIGH (ref 25.1–34.0)
MCHC: 34.2 g/dL (ref 31.5–36.0)
MCV: 105 fL — ABNORMAL HIGH (ref 79.5–101.0)
MONO#: 0.6 10*3/uL (ref 0.1–0.9)
MONO%: 17.8 % — ABNORMAL HIGH (ref 0.0–14.0)
NEUT#: 1.7 10*3/uL (ref 1.5–6.5)
NEUT%: 50.6 % (ref 38.4–76.8)
Platelets: 195 10*3/uL (ref 145–400)
RBC: 3.11 10*6/uL — ABNORMAL LOW (ref 3.70–5.45)
RDW: 15.6 % — ABNORMAL HIGH (ref 11.2–14.5)
WBC: 3.4 10*3/uL — ABNORMAL LOW (ref 3.9–10.3)
lymph#: 0.9 10*3/uL (ref 0.9–3.3)

## 2015-01-08 LAB — COMPREHENSIVE METABOLIC PANEL (CC13)
ALT: 10 U/L (ref 0–55)
AST: 13 U/L (ref 5–34)
Albumin: 3.3 g/dL — ABNORMAL LOW (ref 3.5–5.0)
Alkaline Phosphatase: 109 U/L (ref 40–150)
Anion Gap: 8 mEq/L (ref 3–11)
BUN: 15.4 mg/dL (ref 7.0–26.0)
CO2: 27 mEq/L (ref 22–29)
Calcium: 9 mg/dL (ref 8.4–10.4)
Chloride: 105 mEq/L (ref 98–109)
Creatinine: 0.8 mg/dL (ref 0.6–1.1)
EGFR: 79 mL/min/{1.73_m2} — ABNORMAL LOW (ref >90)
Glucose: 86 mg/dl (ref 70–140)
Potassium: 4 mEq/L (ref 3.5–5.1)
Sodium: 141 mEq/L (ref 136–145)
Total Bilirubin: <0.3 mg/dL (ref 0.20–1.20)
Total Protein: 6.8 g/dL (ref 6.4–8.3)

## 2015-01-08 MED ORDER — INFLUENZA VAC SPLIT QUAD 0.5 ML IM SUSY
0.5000 mL | PREFILLED_SYRINGE | Freq: Once | INTRAMUSCULAR | Status: AC
  Administered 2015-01-08: 0.5 mL via INTRAMUSCULAR
  Filled 2015-01-08: qty 0.5

## 2015-01-08 NOTE — Telephone Encounter (Signed)
Gave and printed appt sched and avs fo rpt; for NOV  °

## 2015-01-08 NOTE — Progress Notes (Signed)
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 Ultrasound of the right axilla is negative   06/05/2014 Pathology Results INVASIVE MAMMARY CARCINOMA   06/05/2014 Receptors her2 Estrogen Receptor: 0%, NEGATIVE Progesterone Receptor: 0%, NEGATIVE Proliferation Marker Ki67: 98%  HER-2/NEU BY CISH - NEGATIVE   06/11/2014 Initial Diagnosis Breast cancer of upper-inner quadrant of right female breast   07/02/2014 PET scan 1. Intensely hypermetabolic mass in the medial RIGHT chest wall with hypermetabolic right axillary node 2. Hypermetabolic nodal metastasis in the LEFT level 2 and LEFT supraclavicular and periportal node (SUV 5).    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   07/28/2014 Pathology Results Right hilar and are lymph node biopsy showed no malignant cells, mixed lymphoid population.   08/11/2014 Miscellaneous nadir CBC: WBC 0.54, hemoglobin 8.9, hematocrit 26%, platelet 125    HISTORY OF PRESENTING ILLNESS:  Kristin Pennington 69 y.o. female is here because of recently diagnosed poorly differentiated carcinoma in her right breast.  She found a lump in her right breast 3 weeks ago, mild tenderness,  No other complains. Last mammo was in oct 2015 which was negative. She feels well overall. She denies any pain, cough, shortness breast, GI symptoms. She has good energy level and appetite, no recent weight loss.  Her last colonoscopy was for 5 years ago which was negative per patient. Her last Pap smear was 3 years ago which was negative per patient.  INTERIM HISTORY: Kristin Pennington returns for follow-up and discuss PET scan results. She is accompanied by her husband to clinic today. She doing very well, denies any significant pain, dyspnea, or any GI symptoms. She has good appetite and eating well. She has seen her surgeon Dr. Dalbert Batman and likely will have surgery next week.  MEDICAL HISTORY:  Past Medical History  Diagnosis Date  . Wears glasses   . Wears partial dentures     top  . 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 Arthritis     knee  . Breast cancer ([place])     bilateral, right breast mass, cancer both breast, x1 chemo  a week ago.  SURGICAL HISTORY: Past Surgical History  Procedure Laterality Date  . Appendectomy    . Cholecystectomy    . Abdominal hysterectomy    . Tonsillectomy    . Colonoscopy    . Portacath placement N/A 06/30/2014    Procedure: INSERTION PORT-A-CATH WITH ULTRA SOUND ON STANDBY;  Surgeon: Fanny Skates, MD;  Location: [facility];  Service: General;  Laterality: N/A;  . Endobronchial  ultrasound Bilateral 07/28/2014    Procedure: ENDOBRONCHIAL ULTRASOUND;  Surgeon: Collene Gobble, MD;  Location: [facility];  Service: Cardiopulmonary;  Laterality: Bilateral;   SOCIAL HISTORY: Social History   Social History  . Marital Status: Married    Spouse Name: N/A  . Number of Children: N/A  . Years of Education: N/A   Occupational History  . Not on file.   Social History Main Topics  . Smoking status: Former Smoker -- 1.00 packs/day for 18 years    Quit date: 03/28/1988  . Smokeless tobacco: Not on file  . Alcohol Use: No  . Drug Use: No  . Sexual Activity: Not on file   Other Topics Concern  . Not on file   Social History Narrative     GYN HISTORY  Menarchal: 11 LMP: 1994, hysrectomy  Contraceptive: no  HRT: since 32  G2P1, one miscarriage     FAMILY HISTORY: Family History  Problem Relation Age of Onset  . Cancer Father 37    gastric cancer   . Cancer Sister 24    lung cancer   . Cancer Maternal Aunt     breast cancer   . Cancer Cousin     breast cancer     ALLERGIES:  is allergic to aspirin; codeine; diclofenac; meloxicam; nitrofurantoin; other; pyridium; sulfa antibiotics; tetracyclines & related; and tape.  MEDICATIONS:  Current Outpatient Prescriptions  Medication Sig Dispense Refill  . aspirin EC 81 MG tablet Take 81 mg by mouth daily.     . calcium carbonate (OS-CAL) 600 MG TABS tablet Take 600 mg by mouth 2 (two) times daily with a meal.    . cyanocobalamin (,VITAMIN B-12,) 1000 MCG/ML injection Inject 1,000 mcg into the muscle every 30 (thirty) days.     Marland Kitchen levETIRAcetam (KEPPRA) 500 MG tablet Take 1 tablet by mouth 2 (two) times daily.    Marland Kitchen lidocaine-prilocaine (EMLA) cream Apply 1 application topically as needed. Apply to portacath  1 hour to 1 1/2 hours prior to procedures as needed.   Cover with clear plastic. 30 g 1  . oxaprozin (DAYPRO) 600 MG tablet Take 1 tablet by mouth 2 (two) times daily.    . phenytoin (DILANTIN) 100 MG ER  capsule Take 1 tablet by mouth 4 (four) times daily. On  Sat , Sun, Tues, Wed, and  Thursdays    -  Take   121m  QID.    .Marland Kitchenprochlorperazine (COMPAZINE) 10 MG tablet TAKE ONE TABLET BY MOUTH EVERY 6 HOURS AS NEEDED FOR NAUSEA / VOMITING 30 tablet 0  . sodium chloride (OCEAN) 0.65 % SOLN nasal spray Place 1 spray into both nostrils as needed for congestion.    . traMADol (ULTRAM) 50 MG tablet     . zolpidem (AMBIEN) 5 MG tablet Take 1 tablet (5 mg total) by mouth at bedtime as needed for sleep. 30 tablet 0   No current facility-administered medications for this visit.    REVIEW OF SYSTEMS:   Constitutional: Denies fevers, chills or abnormal night sweats Eyes: Denies blurriness of vision,  double vision or watery eyes Ears, nose, mouth, throat, and face: Denies mucositis or sore throat Respiratory: Denies cough, dyspnea or wheezes Cardiovascular: Denies palpitation, chest discomfort or lower extremity swelling Gastrointestinal:  Denies nausea, heartburn or change in bowel habits Skin: Denies abnormal skin rashes Lymphatics: Denies new lymphadenopathy or easy bruising Neurological:Denies numbness, tingling or new weaknesses Behavioral/Psych: Mood is stable, no new changes  All other systems were reviewed with the patient and are negative.  PHYSICAL EXAMINATION: ECOG PERFORMANCE STATUS: 1  BP 128/66 mmHg  Pulse 72  Temp(Src) 96.8 F (36 C) (Oral)  Resp 20  Ht '5\' 4"'  (1.626 m)  Wt 172 lb 1.6 oz (78.064 kg)  BMI 29.53 kg/m2  SpO2 100%  GENERAL:alert, no distress and comfortable SKIN: skin color, texture, turgor are normal, no rashes or significant lesions EYES: normal, conjunctiva are pink and non-injected, sclera clear OROPHARYNX:no exudate, no erythema and lips, buccal mucosa, and tongue normal  NECK: supple, thyroid normal size, non-tender, without nodularity LYMPH:  no palpable lymphadenopathy in the cervical, axillary or inguinal LUNGS: clear to auscultation and percussion with  normal breathing effort HEART: regular rate & rhythm and no murmurs and no lower extremity edema ABDOMEN:abdomen soft, non-tender and normal bowel sounds Musculoskeletal:no cyanosis of digits and no clubbing  PSYCH: alert & oriented x 3 with fluent speech NEURO: no focal motor/sensory deficits Breasts: Breast inspection showed them to be symmetrical with no nipple discharge.  The previous palpable mass (initially 4.5X6.0cm) mass in the 12 clock positionof her right breast is not palpable now. Palpation of the left breasts and axilla revealed no obvious mass that I could appreciate.   weight on LABORATORY DATA:  I have reviewed the data as listed CBC Latest Ref Rng 01/08/2015 12/19/2014 12/12/2014  WBC 3.9 - 10.3 10e3/uL 3.4(L) 3.6(L) 3.1(L)  Hemoglobin 11.6 - 15.9 g/dL 11.2(L) 10.1(L) 10.8(L)  Hematocrit 34.8 - 46.6 % 32.6(L) 29.7(L) 30.7(L)  Platelets 145 - 400 10e3/uL 195 147 178    CMP Latest Ref Rng 01/08/2015 12/19/2014 12/12/2014  Glucose 70 - 140 mg/dl 86 103 93  BUN 7.0 - 26.0 mg/dL 15.4 19.5 13.0  Creatinine 0.6 - 1.1 mg/dL 0.8 0.7 0.7  Sodium 136 - 145 mEq/L 141 139 141  Potassium 3.5 - 5.1 mEq/L 4.0 3.6 4.3  Chloride 96 - 112 mmol/L - - -  CO2 22 - 29 mEq/L '27 26 28  ' Calcium 8.4 - 10.4 mg/dL 9.0 8.7 9.2  Total Protein 6.4 - 8.3 g/dL 6.8 6.9 6.9  Total Bilirubin 0.20 - 1.20 mg/dL <0.30 <0.30 <0.20  Alkaline Phos 40 - 150 U/L 109 99 114  AST 5 - 34 U/L '13 15 15  ' ALT 0 - 55 U/L '10 11 10      ' PATHOLOGY REPORT 06/05/2014 Diagnosis Breast, right, needle core biopsy - INVASIVE MAMMARY CARCINOMA, SEE COMMENT. Microscopic Comment Although the grade of tumor is best assessed at resection, with these biopsies, the invasive tumor is grade III. The tumor expresses cytokeratin AE1/3 immunostain and is negative for S-100 and melanin A immunostains. Breast prognostic studies  Results: Estrogen Receptor: 0%, NEGATIVE Progesterone Receptor: 0%, NEGATIVE Proliferation Marker Ki67:  98% COMMENT: The negative hormone receptor study(ies) in this case have an internal positive control.  HER-2/NEU BY CISH - NEGATIVE. RESULT RATIO OF HER2: CEP 17 SIGNALS 1.31 AVERAGE HER2 COPY NUMBER PER CELL 2.10  Diagnosis Breast, left, needle core biopsy, UOQ - DUCTAL CARCINOMA IN-SITU. - SEE COMMENT. Microscopic Comment The ductal carcinoma in situ is  low grade. The carcinoma cells are positive for e-cadherin and cytokeratin AE1/AE3. Smooth muscle myosin, calponin, and p63 stains highlight the presence of myoepithelial cells. Results: IMMUNOHISTOCHEMICAL AND MORPHOMETRIC ANALYSIS BY THE AUTOMATED CELLULAR IMAGING SYSTEM (ACIS) Estrogen Receptor: 100%, POSITIVE, STRONG STAINING INTENSITY Progesterone Receptor: 28%, POSITIVE, WEAK STAINING INTENSITY  Diagnosis 07/10/2014 Lymph node, needle/core biopsy, Left supraclavicular - BENIGN REACTIVE LYMPH NODE TISSUE. - NO METASTATIC CARCINOMA IDENTIFIED.  Diagnosis 07/28/2014  FINE NEEDLE ASPIRATION, ENDOSCOPIC, 10R LYMPH NODE(SPECIMEN 1 OF 3 COLLECTED (07/28/14): NO MALIGNANT CELLS IDENTIFIED. MIXED LYMPHOID POPULATION.  RADIOGRAPHIC STUDIES: I have personally reviewed the radiological images as listed and agreed with the findings in the report.  Breast MRI 12/16/2014 Right breast: The previously seen enhancing irregular right breast mass located within the upper inner quadrant has decreased in size and there is markedly decreased enhancement associated with the mass post chemotherapy. On the non subtracted T1 images the mass now measures 2.2 x 1.9 x 1.1 cm in size and previously the mass measured 3.6 x 3.3 x 2.8 cm in size. There are no new enhancing lesions.  Left breast: The previously seen enhancement associated with the biopsy proven left breast DCIS has resolved. There are no new areas of enhancement.  Lymph nodes: No abnormal appearing lymph nodes.  Ancillary findings: None.  PET 01/01/2015  IMPRESSION: Decrease in  size and enhancement of the mass (invasive mammary carcinoma) located within the upper inner quadrant of the right breast as discussed above. Also, resolution of previously seen enhancement located within the left breast at the 1 o'clock position (DCIS). No evidence for adenopathy and no additional findings.   IMPRESSION: 1. Significant interval partial treatment response in the right breast mass. 2. No residual hypermetabolic metastatic disease. Previously described right axillary, left neck, bilateral hilar and right periportal hypermetabolic adenopathy has resolved. 3. Atherosclerosis, including left main and two-vessel coronary artery disease.   ASSESSMENT & PLAN:  69 year old postmenopausal woman, with past medical history of seizure and arthritis, presented with a palpable mass in the inner upper quadrant of her right breast. Biopsy revealed poorly differentiated carcinoma, triple negative.  1. 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 -The PET scan showed hypermetabolic lymph nodes in the left supraclavicular, left cervical level II, right hilar and a periportal area. Although they are suspicious for distant note metastasis, both left [place] and right hilar node biopsy were negaative, so likely reactive adenopathy. -we have discussed her case in our breast tumor board, the consensus is proceed with neoadjuvant chemotherapy, followed by surgery and radiation. -I recommended to start chemotherapy with dose dense Adriamycin and Cytoxan X4, followed by by biweekly TaxolX4, to maximally control her breast cancer. She agrees -She has completed 4 cycles of dose dense AC, however her breast mass has only shunk by 20-30%. Given the suboptimal response to [facility], I recommend the next chemotherapy to be weekly  carbo and Taxol, giving her triple negative disease.  -She has been tolerating chemotherapy well overall, is finishing the last cycle carbotaxol today. -I discussed her repeated breast MRI, which showed partial response of the primary breast tumor, no adenopathy. No other new lesions. -I discussed her restaging PET scan, which showed resolution of her previously hypermetabolic adenopathy. Partial response in her breast mass. No other new lesions. -She will have right lumpectomy and sentinel lymph nodes  by Dr. Dalbert Batman soon, likely next week. Her port will be removed during the surgery. -We discussed the role of adjuvant chemotherapy, such as capecitabine (oral), if she has significant residual disease.   2. Neutropenia and anemia -Secondary to chemotherapy. -improved  -continue close monitoring   3. Arthritis -Worsening with the chemotherapy, improved now  -She will continue use tramadol as needed  4. Genetic -She has multiple family members had breast cancer -She will see genetic counselor soon.  Plan: -She will have right breast lumpectomy and sentinel lymph node soon, possible next week -I'll see her back 2 weeks after her surgery to review her pathology results.  All questions were answered. The patient knows to call the clinic with any problems, questions or concerns. I spent 20 minutes counseling the patient face to face. The total time spent in the appointment was 30 minutes and more than 50% was on counseling.   We'll continue  Truitt Merle, MD 01/08/2015 9:37 AM

## 2015-01-12 ENCOUNTER — Other Ambulatory Visit (HOSPITAL_COMMUNITY): Payer: Self-pay | Admitting: *Deleted

## 2015-01-12 ENCOUNTER — Encounter (HOSPITAL_COMMUNITY): Payer: Self-pay

## 2015-01-12 ENCOUNTER — Ambulatory Visit (HOSPITAL_BASED_OUTPATIENT_CLINIC_OR_DEPARTMENT_OTHER): Payer: Medicare Other | Admitting: Genetic Counselor

## 2015-01-12 ENCOUNTER — Encounter (HOSPITAL_COMMUNITY)
Admission: RE | Admit: 2015-01-12 | Discharge: 2015-01-12 | Disposition: A | Discharge status: Home / Self Care | Payer: Medicare Other | Source: Ambulatory Visit | Attending: General Surgery | Admitting: General Surgery

## 2015-01-12 ENCOUNTER — Other Ambulatory Visit: Payer: Medicare Other

## 2015-01-12 ENCOUNTER — Encounter: Payer: Self-pay | Admitting: Genetic Counselor

## 2015-01-12 DIAGNOSIS — N6021 Fibroadenosis of right breast: Secondary | ICD-10-CM | POA: Diagnosis not present

## 2015-01-12 DIAGNOSIS — N6022 Fibroadenosis of left breast: Secondary | ICD-10-CM | POA: Diagnosis not present

## 2015-01-12 DIAGNOSIS — C50211 Malignant neoplasm of upper-inner quadrant of right female breast: Secondary | ICD-10-CM | POA: Diagnosis not present

## 2015-01-12 DIAGNOSIS — Z882 Allergy status to sulfonamides status: Secondary | ICD-10-CM | POA: Diagnosis not present

## 2015-01-12 DIAGNOSIS — Z886 Allergy status to analgesic agent status: Secondary | ICD-10-CM | POA: Diagnosis not present

## 2015-01-12 DIAGNOSIS — Z315 Encounter for genetic counseling: Secondary | ICD-10-CM | POA: Diagnosis not present

## 2015-01-12 DIAGNOSIS — Z9221 Personal history of antineoplastic chemotherapy: Secondary | ICD-10-CM | POA: Diagnosis not present

## 2015-01-12 DIAGNOSIS — N6011 Diffuse cystic mastopathy of right breast: Secondary | ICD-10-CM | POA: Diagnosis not present

## 2015-01-12 DIAGNOSIS — Z7982 Long term (current) use of aspirin: Secondary | ICD-10-CM | POA: Diagnosis not present

## 2015-01-12 DIAGNOSIS — C50412 Malignant neoplasm of upper-outer quadrant of left female breast: Secondary | ICD-10-CM | POA: Diagnosis not present

## 2015-01-12 DIAGNOSIS — Z888 Allergy status to other drugs, medicaments and biological substances status: Secondary | ICD-10-CM | POA: Diagnosis not present

## 2015-01-12 DIAGNOSIS — Z452 Encounter for adjustment and management of vascular access device: Secondary | ICD-10-CM | POA: Diagnosis not present

## 2015-01-12 DIAGNOSIS — Z90710 Acquired absence of both cervix and uterus: Secondary | ICD-10-CM | POA: Diagnosis not present

## 2015-01-12 DIAGNOSIS — Z87891 Personal history of nicotine dependence: Secondary | ICD-10-CM | POA: Diagnosis not present

## 2015-01-12 DIAGNOSIS — Z803 Family history of malignant neoplasm of breast: Secondary | ICD-10-CM | POA: Diagnosis not present

## 2015-01-12 DIAGNOSIS — G40909 Epilepsy, unspecified, not intractable, without status epilepticus: Secondary | ICD-10-CM | POA: Diagnosis not present

## 2015-01-12 DIAGNOSIS — Z885 Allergy status to narcotic agent status: Secondary | ICD-10-CM | POA: Diagnosis not present

## 2015-01-12 DIAGNOSIS — Z881 Allergy status to other antibiotic agents status: Secondary | ICD-10-CM | POA: Diagnosis not present

## 2015-01-12 DIAGNOSIS — Z9049 Acquired absence of other specified parts of digestive tract: Secondary | ICD-10-CM | POA: Diagnosis not present

## 2015-01-12 DIAGNOSIS — N6012 Diffuse cystic mastopathy of left breast: Secondary | ICD-10-CM | POA: Diagnosis not present

## 2015-01-12 DIAGNOSIS — Z170 Estrogen receptor positive status [ER+]: Secondary | ICD-10-CM | POA: Diagnosis not present

## 2015-01-12 HISTORY — DX: Pneumonia, unspecified organism: J18.9

## 2015-01-12 HISTORY — DX: Unspecified glaucoma: H40.9

## 2015-01-12 HISTORY — DX: Gastro-esophageal reflux disease without esophagitis: K21.9

## 2015-01-12 NOTE — Pre-Procedure Instructions (Signed)
Kristin Pennington  01/12/2015     Your procedure is scheduled on Thursday, January 15, 2015 at 11:00 AM.   Report to [facility] Entrance "A" Admitting Office at 9:00 AM.   Call this number if you have problems the morning of surgery: [phone]   Any questions prior to day of surgery, please call [phone] between 8 & 4 PM.   Remember:  Do not eat food or drink liquids after midnight Wednesday, 01/14/15.  Take these medicines the morning of surgery with A SIP OF WATER: Levetiracetam (Keppra), Phenytoin (Dilantin), eye drops, Tramadol - if needed, Zofran - if needed.  Stop Aspirin and Daypro (Oxaprozin).   Do not wear jewelry, make-up or nail polish.  Do not wear lotions, powders, or perfumes.  You may NOT wear deodorant.  Do not shave 48 hours prior to surgery.    Do not bring valuables to the hospital.  [facility] is not responsible for any belongings or valuables.  Contacts, dentures or bridgework may not be worn into surgery.  Leave your suitcase in the car.  After surgery it may be brought to your room.  For patients admitted to the hospital, discharge time will be determined by your treatment team.  Patients discharged the day of surgery will not be allowed to drive home.   Special instructions:  [facility] - Preparing for Surgery  Before surgery, you can play an important role.  Because skin is not sterile, your skin needs to be as free of germs as possible.  You can reduce the number of germs on you skin by washing with CHG (chlorahexidine gluconate) soap before surgery.  CHG is an antiseptic cleaner which kills germs and bonds with the skin to continue killing germs even after washing.  Please DO NOT use if you have an allergy to CHG or antibacterial soaps.  If your skin becomes reddened/irritated stop using the CHG and inform your nurse when you arrive at Short Stay.  Do not shave (including legs and underarms) for at least 48 hours prior to the first CHG  shower.  You may shave your face.  Please follow these instructions carefully:   1.  Shower with CHG Soap the night before surgery and the                                morning of Surgery.  2.  If you choose to wash your hair, wash your hair first as usual with your       normal shampoo.  3.  After you shampoo, rinse your hair and body thoroughly to remove the                      Shampoo.  4.  Use CHG as you would any other liquid soap.  You can apply chg directly       to the skin and wash gently with scrungie or a clean washcloth.  5.  Apply the CHG Soap to your body ONLY FROM THE NECK DOWN.        Do not use on open wounds or open sores.  Avoid contact with your eyes, ears, mouth and genitals (private parts).  Wash genitals (private parts) with your normal soap.  6.  Wash thoroughly, paying special attention to the area where your surgery        will be performed.  7.  Thoroughly rinse  your body with warm water from the neck down.  8.  DO NOT shower/wash with your normal soap after using and rinsing off       the CHG Soap.  9.  Pat yourself dry with a clean towel.            10.  Wear clean pajamas.            11.  Place clean sheets on your bed the night of your first shower and do not        sleep with pets.  Day of Surgery  Do not apply any lotions/deodorants the morning of surgery.  Please wear clean clothes to the hospital.  Please read over the following fact sheets that you were given. Pain Booklet, Coughing and Deep Breathing and Surgical Site Infection Prevention

## 2015-01-12 NOTE — Progress Notes (Signed)
REFERRING PROVIDER: Lonzo Cloud, MD [place], [place] [address]   Truitt Merle, MD  PRIMARY PROVIDER:  Lonzo Cloud, MD  PRIMARY REASON FOR VISIT:  1. Breast cancer of upper-inner quadrant of right female breast ([place])   2. Family history of breast cancer      HISTORY OF PRESENT ILLNESS:   Kristin Pennington, a 69 y.o. female, was seen for a  cancer genetics consultation at the request of Dr. Burr Medico due to a personal and family history of cancer.  Kristin Pennington presents to clinic today to discuss the possibility of a hereditary predisposition to cancer, genetic testing, and to further clarify her future cancer risks, as well as potential cancer risks for family members.   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 This was treated with chemotherapy, and surgery with bilateral lumpectomies is going to be performed next week.  She will start radiation later this year.      CANCER HISTORY:  Oncology History   Breast cancer of upper-inner quadrant of right female breast   Staging form: Breast, AJCC 7th Edition     Clinical stage from 06/18/2014: Stage IIA (T2, N0, M0) - Unsigned       Staging comments: Staged at breast conference on 3.23.16        Breast cancer of upper-inner quadrant of right female breast ([place])   06/05/2014 Breast US ultrasound is performed, showing a 2.4 x 1.5 x 2.3 cm heterogeneous mass at the right breast 1 o'clock 15 cm from nipple palpable area correlating to the mammographic finding. Ultrasound of the right axilla is negative   06/05/2014 Pathology Results INVASIVE MAMMARY CARCINOMA   06/05/2014 Receptors her2 Estrogen Receptor: 0%, NEGATIVE Progesterone Receptor: 0%, NEGATIVE Proliferation Marker Ki67: 98%  HER-2/NEU BY CISH - NEGATIVE   06/11/2014 Initial Diagnosis Breast cancer of upper-inner quadrant of right female breast   07/02/2014 PET scan 1.  Intensely hypermetabolic mass in the medial RIGHT chest wall with hypermetabolic right axillary node 2. Hypermetabolic nodal metastasis in the LEFT level 2 and LEFT supraclavicular and periportal node (SUV 5).    07/04/2014 Pathology Results Left breast mass biopsy showed DCIS, ER100%, PR 48%   07/10/2014 Pathology Results left [place] node biopsy was negative for malignant cells, benign reactive lymph tissue    07/18/2014 -  Chemotherapy Dose dense Adriamycin and Cytoxan, every 2 weeks, X4, followed by weekly carbo and Taxol for 12 weeks.   07/28/2014 Pathology Results Right hilar and are lymph node biopsy showed no malignant cells, mixed lymphoid population.   08/11/2014 Miscellaneous nadir CBC: WBC 0.54, hemoglobin 8.9, hematocrit 26%, platelet 125     HORMONAL RISK FACTORS:  Menarche was at age 69.  First live birth at age 69.  OCP use for approximately 0 years.  Ovaries intact: no.  Hysterectomy: yes.  Menopausal status: postmenopausal.  HRT use: was on an estrogen patch for an uknown number of  years. Colonoscopy: yes; normal. Mammogram within the last year: yes. Number of breast biopsies: 2. Up to date with pelvic exams:  yes. Any excessive radiation exposure in the past:  no  Past Medical History  Diagnosis Date  . Wears glasses   . Wears partial dentures     top  . 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 Arthritis     knee  . Breast  cancer ([place])     bilateral, right breast mass, cancer both breast, x1 chemo  a week ago.    Past Surgical History  Procedure Laterality Date  . Appendectomy    . Cholecystectomy    . Abdominal hysterectomy    . Tonsillectomy    . Colonoscopy    . Portacath placement N/A 06/30/2014    Procedure: INSERTION PORT-A-CATH WITH ULTRA SOUND ON STANDBY;  Surgeon: Fanny Skates, MD;  Location: [facility];  Service: General;  Laterality: N/A;  . Endobronchial ultrasound Bilateral 07/28/2014    Procedure: ENDOBRONCHIAL ULTRASOUND;  Surgeon:  Collene Gobble, MD;  Location: [facility];  Service: Cardiopulmonary;  Laterality: Bilateral;    Social History   Social History  . Marital Status: Married    Spouse Name: N/A  . Number of Children: N/A  . Years of Education: N/A   Social History Main Topics  . Smoking status: Former Smoker -- 1.00 packs/day for 18 years    Types: Cigarettes    Quit date: 03/28/1988  . Smokeless tobacco: None  . Alcohol Use: No  . Drug Use: No  . Sexual Activity: Not Asked   Other Topics Concern  . None   Social History Narrative     FAMILY HISTORY:  We obtained a detailed, 4-generation family history.  Significant diagnoses are listed below: Family History  Problem Relation Age of Onset  . Stomach cancer Father 18  . Lung cancer Sister 1    not sure if primary or breast cancer met  . Breast cancer Maternal Aunt 72  . Breast cancer Cousin 67  . Heart Problems Paternal Aunt    The patient has one son who is cancer free, and two sisters who are both deceased.  One died in a MVA and the other died of lung cancer.  Kristin Pennington reports that her sister may have been diagnosed with breast cancer about 1.5 years before she died and therefore it is unclear if her lung cancer was a result of mets or a lung primary from smoking.  Kristin Pennington's parents are both deceased.  Her father died of stomach cancer and her mother from heart problems.  Her mother had five brothers and two sisters. One brother died in infancy, and one sister was diagnosed with breast cancer at 62  This sister has a daughter who was recently diagnosed with breast cancer, and another cousin was diagnosed with cervical cancer.  Reportedly both of these individuals had genetic testing that was "Negative". There is no other reported cancer on either side of the family. Patient's maternal ancestors are of [place] descent, and paternal ancestors are of [place] descent. There is no reported Ashkenazi Jewish ancestry. There is no known  consanguinity.  GENETIC COUNSELING ASSESSMENT: Kristin Pennington is a 69 y.o. female with a personal and family history of breast cancer which somewhat suggestive of a hereditary cancer syndrome and predisposition to cancer. We, therefore, discussed and recommended the following at today's visit.   DISCUSSION: We discussed that about 5-10% of breast cancer is due to hereditary cancer syndromes.  Most of these are the result of BRCA mutations, but other common hereditary conditions are from ATM, CHEK2 and PALB2 mutations.  We reviewed the characteristics, features and inheritance patterns of hereditary cancer syndromes. We also discussed genetic testing, including the appropriate family members to test, the process of testing, insurance coverage and turn-around-time for results. We discussed the implications of a negative, positive and/or variant of uncertain significant  result. We recommended Kristin Pennington pursue genetic testing for the Breast/Ovarian cancer gene panel. The Breast/Ovarian gene panel offered by [facility] includes sequencing and rearrangement analysis for the following 20 genes:  ATM, BARD1, BRCA1, BRCA2, BRIP1, CDH1, CHEK2, EPCAM, FANCC, MLH1, MSH2, MSH6, NBN, PALB2, PMS2, PTEN, RAD51C, RAD51D, TP53, and XRCC2.     Based on Ms. Govan's personal and family history of cancer, she meets medical criteria for genetic testing. Despite that she meets criteria, she may still have an out of pocket cost. We discussed that if her out of pocket cost for testing is over $100, the laboratory will call and confirm whether she wants to proceed with testing.  If the out of pocket cost of testing is less than $100 she will be billed by the genetic testing laboratory.   PLAN: After considering the risks, benefits, and limitations, Kristin Pennington  provided informed consent to pursue genetic testing and the blood sample was sent to [facility] for analysis of the Breast/Ovarian cancer panel. Results should be  available within approximately 2-3 weeks' time, at which point they will be disclosed by telephone to Kristin Pennington, as will any additional recommendations warranted by these results. Kristin Pennington will receive a summary of her genetic counseling visit and a copy of her results once available. This information will also be available in Epic. We encouraged Kristin Pennington to remain in contact with cancer genetics annually so that we can continuously update the family history and inform her of any changes in cancer genetics and testing that may be of benefit for her family. Kristin Pennington's questions were answered to her satisfaction today. Our contact information was provided should additional questions or concerns arise.  Lastly, we encouraged Kristin Pennington to remain in contact with cancer genetics annually so that we can continuously update the family history and inform her of any changes in cancer genetics and testing that may be of benefit for this family.   Ms.  Pennington's questions were answered to her satisfaction today. Our contact information was provided should additional questions or concerns arise. Thank you for the referral and allowing [place] to share in the care of your patient.   Karen P. Florene Glen, [place], [facility] Certified Genetic Counselor [email or web address] phone: [phone]  The patient was seen for a total of 30 minutes in face-to-face genetic counseling.  This patient was discussed with Drs. Magrinat, Lindi Adie and/or Burr Medico who agrees with the above.    _______________________________________________________________________ For Office Staff:  Number of people involved in session: 2 Was an Intern/ student involved with case: no

## 2015-01-13 ENCOUNTER — Ambulatory Visit
Admission: RE | Admit: 2015-01-13 | Discharge: 2015-01-13 | Disposition: A | Discharge status: Home / Self Care | Payer: Medicare Other | Source: Ambulatory Visit | Attending: General Surgery | Admitting: General Surgery

## 2015-01-13 DIAGNOSIS — C50211 Malignant neoplasm of upper-inner quadrant of right female breast: Secondary | ICD-10-CM | POA: Diagnosis not present

## 2015-01-13 DIAGNOSIS — C50912 Malignant neoplasm of unspecified site of left female breast: Principal | ICD-10-CM

## 2015-01-13 DIAGNOSIS — C50911 Malignant neoplasm of unspecified site of right female breast: Secondary | ICD-10-CM

## 2015-01-13 DIAGNOSIS — D0512 Intraductal carcinoma in situ of left breast: Secondary | ICD-10-CM | POA: Diagnosis not present

## 2015-01-15 ENCOUNTER — Encounter (HOSPITAL_COMMUNITY)
Admission: RE | Disposition: A | Discharge status: Home / Self Care | Payer: Self-pay | Source: Ambulatory Visit | Attending: General Surgery

## 2015-01-15 ENCOUNTER — Ambulatory Visit (HOSPITAL_COMMUNITY)
Admission: RE | Admit: 2015-01-15 | Discharge: 2015-01-15 | Disposition: A | Discharge status: Home / Self Care | Payer: Medicare Other | Source: Ambulatory Visit | Attending: General Surgery | Admitting: General Surgery

## 2015-01-15 ENCOUNTER — Ambulatory Visit
Admission: RE | Admit: 2015-01-15 | Discharge: 2015-01-15 | Disposition: A | Discharge status: Home / Self Care | Payer: Medicare Other | Source: Ambulatory Visit | Attending: General Surgery | Admitting: General Surgery

## 2015-01-15 ENCOUNTER — Ambulatory Visit (HOSPITAL_COMMUNITY): Payer: Medicare Other | Admitting: Certified Registered Nurse Anesthetist

## 2015-01-15 DIAGNOSIS — G40909 Epilepsy, unspecified, not intractable, without status epilepticus: Secondary | ICD-10-CM | POA: Insufficient documentation

## 2015-01-15 DIAGNOSIS — C50912 Malignant neoplasm of unspecified site of left female breast: Principal | ICD-10-CM

## 2015-01-15 DIAGNOSIS — C50919 Malignant neoplasm of unspecified site of unspecified female breast: Secondary | ICD-10-CM | POA: Diagnosis not present

## 2015-01-15 DIAGNOSIS — Z885 Allergy status to narcotic agent status: Secondary | ICD-10-CM | POA: Insufficient documentation

## 2015-01-15 DIAGNOSIS — Z888 Allergy status to other drugs, medicaments and biological substances status: Secondary | ICD-10-CM | POA: Insufficient documentation

## 2015-01-15 DIAGNOSIS — N6022 Fibroadenosis of left breast: Secondary | ICD-10-CM | POA: Insufficient documentation

## 2015-01-15 DIAGNOSIS — Z886 Allergy status to analgesic agent status: Secondary | ICD-10-CM | POA: Insufficient documentation

## 2015-01-15 DIAGNOSIS — Z452 Encounter for adjustment and management of vascular access device: Secondary | ICD-10-CM | POA: Diagnosis not present

## 2015-01-15 DIAGNOSIS — C50211 Malignant neoplasm of upper-inner quadrant of right female breast: Secondary | ICD-10-CM | POA: Diagnosis present

## 2015-01-15 DIAGNOSIS — C50911 Malignant neoplasm of unspecified site of right female breast: Secondary | ICD-10-CM

## 2015-01-15 DIAGNOSIS — Z882 Allergy status to sulfonamides status: Secondary | ICD-10-CM | POA: Insufficient documentation

## 2015-01-15 DIAGNOSIS — Z881 Allergy status to other antibiotic agents status: Secondary | ICD-10-CM | POA: Insufficient documentation

## 2015-01-15 DIAGNOSIS — Z803 Family history of malignant neoplasm of breast: Secondary | ICD-10-CM | POA: Insufficient documentation

## 2015-01-15 DIAGNOSIS — R921 Mammographic calcification found on diagnostic imaging of breast: Secondary | ICD-10-CM | POA: Diagnosis not present

## 2015-01-15 DIAGNOSIS — D0512 Intraductal carcinoma in situ of left breast: Secondary | ICD-10-CM | POA: Diagnosis not present

## 2015-01-15 DIAGNOSIS — G8918 Other acute postprocedural pain: Secondary | ICD-10-CM | POA: Diagnosis not present

## 2015-01-15 DIAGNOSIS — N6012 Diffuse cystic mastopathy of left breast: Secondary | ICD-10-CM | POA: Insufficient documentation

## 2015-01-15 DIAGNOSIS — Z9012 Acquired absence of left breast and nipple: Secondary | ICD-10-CM | POA: Diagnosis not present

## 2015-01-15 DIAGNOSIS — N6021 Fibroadenosis of right breast: Secondary | ICD-10-CM | POA: Insufficient documentation

## 2015-01-15 DIAGNOSIS — Z9049 Acquired absence of other specified parts of digestive tract: Secondary | ICD-10-CM | POA: Insufficient documentation

## 2015-01-15 DIAGNOSIS — Z7982 Long term (current) use of aspirin: Secondary | ICD-10-CM | POA: Insufficient documentation

## 2015-01-15 DIAGNOSIS — Z87891 Personal history of nicotine dependence: Secondary | ICD-10-CM | POA: Insufficient documentation

## 2015-01-15 DIAGNOSIS — C50412 Malignant neoplasm of upper-outer quadrant of left female breast: Secondary | ICD-10-CM | POA: Diagnosis not present

## 2015-01-15 DIAGNOSIS — Z170 Estrogen receptor positive status [ER+]: Secondary | ICD-10-CM | POA: Insufficient documentation

## 2015-01-15 DIAGNOSIS — N6011 Diffuse cystic mastopathy of right breast: Secondary | ICD-10-CM | POA: Insufficient documentation

## 2015-01-15 DIAGNOSIS — Z90710 Acquired absence of both cervix and uterus: Secondary | ICD-10-CM | POA: Insufficient documentation

## 2015-01-15 DIAGNOSIS — Z9011 Acquired absence of right breast and nipple: Secondary | ICD-10-CM | POA: Diagnosis not present

## 2015-01-15 DIAGNOSIS — Z9221 Personal history of antineoplastic chemotherapy: Secondary | ICD-10-CM | POA: Insufficient documentation

## 2015-01-15 HISTORY — PX: BREAST LUMPECTOMY WITH RADIOACTIVE SEED LOCALIZATION: SHX6424

## 2015-01-15 HISTORY — PX: PORT-A-CATH REMOVAL: SHX5289

## 2015-01-15 HISTORY — PX: BREAST LUMPECTOMY WITH RADIOACTIVE SEED AND SENTINEL LYMPH NODE BIOPSY: SHX6550

## 2015-01-15 SURGERY — BREAST LUMPECTOMY WITH RADIOACTIVE SEED LOCALIZATION
Anesthesia: General | Site: Chest | Laterality: Right

## 2015-01-15 MED ORDER — FENTANYL CITRATE (PF) 100 MCG/2ML IJ SOLN
INTRAMUSCULAR | Status: AC
  Administered 2015-01-15: 50 ug
  Filled 2015-01-15: qty 2

## 2015-01-15 MED ORDER — SODIUM CHLORIDE 0.9 % IJ SOLN: 3.0000 mL | INTRAMUSCULAR | Status: DC | PRN

## 2015-01-15 MED ORDER — FENTANYL CITRATE (PF) 250 MCG/5ML IJ SOLN
INTRAMUSCULAR | Status: AC
  Filled 2015-01-15: qty 5

## 2015-01-15 MED ORDER — FENTANYL CITRATE (PF) 100 MCG/2ML IJ SOLN: 25.0000 ug | INTRAMUSCULAR | Status: DC | PRN

## 2015-01-15 MED ORDER — BUPIVACAINE-EPINEPHRINE (PF) 0.25% -1:200000 IJ SOLN
INTRAMUSCULAR | Status: AC
  Filled 2015-01-15: qty 30

## 2015-01-15 MED ORDER — LIDOCAINE HCL (CARDIAC) 20 MG/ML IV SOLN
INTRAVENOUS | Status: DC | PRN
  Administered 2015-01-15: 40 mg via INTRAVENOUS

## 2015-01-15 MED ORDER — CEFAZOLIN SODIUM-DEXTROSE 2-3 GM-% IV SOLR
INTRAVENOUS | Status: AC
  Filled 2015-01-15: qty 50

## 2015-01-15 MED ORDER — FENTANYL CITRATE (PF) 100 MCG/2ML IJ SOLN
INTRAMUSCULAR | Status: AC
  Filled 2015-01-15: qty 2

## 2015-01-15 MED ORDER — SODIUM CHLORIDE 0.9 % IJ SOLN
INTRAMUSCULAR | Status: DC | PRN
  Administered 2015-01-15: 5 mL via INTRAMUSCULAR

## 2015-01-15 MED ORDER — HYDROCODONE-ACETAMINOPHEN 5-325 MG PO TABS: 1.0000 | ORAL_TABLET | Freq: Four times a day (QID) | ORAL | Status: DC | PRN

## 2015-01-15 MED ORDER — PROPOFOL 10 MG/ML IV BOLUS
INTRAVENOUS | Status: DC | PRN
  Administered 2015-01-15: 140 mg via INTRAVENOUS

## 2015-01-15 MED ORDER — SODIUM CHLORIDE 0.9 % IV SOLN: INTRAVENOUS | Status: DC

## 2015-01-15 MED ORDER — MIDAZOLAM HCL 2 MG/2ML IJ SOLN
INTRAMUSCULAR | Status: AC
  Filled 2015-01-15: qty 4

## 2015-01-15 MED ORDER — CEFAZOLIN SODIUM-DEXTROSE 2-3 GM-% IV SOLR
2.0000 g | INTRAVENOUS | Status: AC
  Administered 2015-01-15: 2 g via INTRAVENOUS

## 2015-01-15 MED ORDER — PROMETHAZINE HCL 25 MG/ML IJ SOLN
INTRAMUSCULAR | Status: AC
  Administered 2015-01-15: 6.25 mg
  Filled 2015-01-15: qty 1

## 2015-01-15 MED ORDER — SODIUM CHLORIDE 0.9 % IJ SOLN: 3.0000 mL | Freq: Two times a day (BID) | INTRAMUSCULAR | Status: DC

## 2015-01-15 MED ORDER — OXYCODONE HCL 5 MG PO TABS: 5.0000 mg | ORAL_TABLET | ORAL | Status: DC | PRN

## 2015-01-15 MED ORDER — FENTANYL CITRATE (PF) 100 MCG/2ML IJ SOLN
INTRAMUSCULAR | Status: DC | PRN
  Administered 2015-01-15: 25 ug via INTRAVENOUS
  Administered 2015-01-15: 50 ug via INTRAVENOUS
  Administered 2015-01-15: 25 ug via INTRAVENOUS
  Administered 2015-01-15: 50 ug via INTRAVENOUS

## 2015-01-15 MED ORDER — TECHNETIUM TC 99M SULFUR COLLOID FILTERED
1.0000 | Freq: Once | INTRAVENOUS | Status: AC | PRN
  Administered 2015-01-15: 1 via INTRADERMAL

## 2015-01-15 MED ORDER — METHYLENE BLUE 1 % INJ SOLN
INTRAMUSCULAR | Status: AC
  Filled 2015-01-15: qty 10

## 2015-01-15 MED ORDER — ONDANSETRON HCL 4 MG/2ML IJ SOLN
INTRAMUSCULAR | Status: DC | PRN
  Administered 2015-01-15: 4 mg via INTRAVENOUS

## 2015-01-15 MED ORDER — CHLORHEXIDINE GLUCONATE 4 % EX LIQD: 1.0000 "application " | Freq: Once | CUTANEOUS | Status: DC

## 2015-01-15 MED ORDER — SODIUM CHLORIDE 0.9 % IJ SOLN
INTRAMUSCULAR | Status: AC
  Filled 2015-01-15: qty 10

## 2015-01-15 MED ORDER — ONDANSETRON HCL 4 MG/2ML IJ SOLN
4.0000 mg | Freq: Once | INTRAMUSCULAR | Status: AC | PRN
  Administered 2015-01-15: 4 mg via INTRAVENOUS

## 2015-01-15 MED ORDER — FENTANYL CITRATE (PF) 100 MCG/2ML IJ SOLN
25.0000 ug | INTRAMUSCULAR | Status: DC | PRN
  Administered 2015-01-15 (×2): 25 ug via INTRAVENOUS
  Administered 2015-01-15: 50 ug via INTRAVENOUS

## 2015-01-15 MED ORDER — ONDANSETRON HCL 4 MG/2ML IJ SOLN
INTRAMUSCULAR | Status: AC
  Filled 2015-01-15: qty 2

## 2015-01-15 MED ORDER — ACETAMINOPHEN 650 MG RE SUPP: 650.0000 mg | RECTAL | Status: DC | PRN

## 2015-01-15 MED ORDER — 0.9 % SODIUM CHLORIDE (POUR BTL) OPTIME
TOPICAL | Status: DC | PRN
  Administered 2015-01-15: 1000 mL

## 2015-01-15 MED ORDER — BUPIVACAINE-EPINEPHRINE 0.25% -1:200000 IJ SOLN
INTRAMUSCULAR | Status: DC | PRN
  Administered 2015-01-15: 28 mL

## 2015-01-15 MED ORDER — SODIUM CHLORIDE 0.9 % IV SOLN: 250.0000 mL | INTRAVENOUS | Status: DC | PRN

## 2015-01-15 MED ORDER — MIDAZOLAM HCL 2 MG/2ML IJ SOLN
INTRAMUSCULAR | Status: AC
  Administered 2015-01-15: 1 mg
  Filled 2015-01-15: qty 2

## 2015-01-15 MED ORDER — PROPOFOL 10 MG/ML IV BOLUS
INTRAVENOUS | Status: AC
  Filled 2015-01-15: qty 20

## 2015-01-15 MED ORDER — ACETAMINOPHEN 325 MG PO TABS: 650.0000 mg | ORAL_TABLET | ORAL | Status: DC | PRN

## 2015-01-15 MED ORDER — LACTATED RINGERS IV SOLN
INTRAVENOUS | Status: DC
  Administered 2015-01-15 (×2): via INTRAVENOUS

## 2015-01-15 SURGICAL SUPPLY — 53 items
APPLIER CLIP 9.375 MED OPEN (MISCELLANEOUS) ×5
BINDER BREAST LRG (GAUZE/BANDAGES/DRESSINGS) IMPLANT
BINDER BREAST XLRG (GAUZE/BANDAGES/DRESSINGS) ×5 IMPLANT
BLADE SURG 15 STRL LF DISP TIS (BLADE) IMPLANT
BLADE SURG 15 STRL SS (BLADE)
CANISTER SUCTION 2500CC (MISCELLANEOUS) ×5 IMPLANT
CHLORAPREP W/TINT 26ML (MISCELLANEOUS) ×10 IMPLANT
CLIP APPLIE 9.375 MED OPEN (MISCELLANEOUS) ×3 IMPLANT
CONT SPEC 4OZ CLIKSEAL STRL BL (MISCELLANEOUS) ×15 IMPLANT
COVER PROBE W GEL 5X96 (DRAPES) ×5 IMPLANT
COVER SURGICAL LIGHT HANDLE (MISCELLANEOUS) ×5 IMPLANT
DERMABOND ADVANCED (GAUZE/BANDAGES/DRESSINGS) ×4
DERMABOND ADVANCED .7 DNX12 (GAUZE/BANDAGES/DRESSINGS) ×6 IMPLANT
DEVICE DUBIN SPECIMEN MAMMOGRA (MISCELLANEOUS) ×10 IMPLANT
DRAPE CHEST BREAST 15X10 FENES (DRAPES) ×5 IMPLANT
DRAPE PED LAPAROTOMY (DRAPES) IMPLANT
DRAPE UTILITY W/TAPE 26X15 (DRAPES) IMPLANT
DRAPE UTILITY XL STRL (DRAPES) IMPLANT
DRSG PAD ABDOMINAL 8X10 ST (GAUZE/BANDAGES/DRESSINGS) ×10 IMPLANT
ELECT CAUTERY BLADE 6.4 (BLADE) ×10 IMPLANT
ELECT REM PT RETURN 9FT ADLT (ELECTROSURGICAL) ×5
ELECTRODE REM PT RTRN 9FT ADLT (ELECTROSURGICAL) ×3 IMPLANT
GAUZE SPONGE 4X4 12PLY STRL (GAUZE/BANDAGES/DRESSINGS) ×5 IMPLANT
GLOVE BIO SURGEON STRL SZ7 (GLOVE) ×5 IMPLANT
GLOVE BIO SURGEON STRL SZ7.5 (GLOVE) ×5 IMPLANT
GLOVE BIOGEL PI IND STRL 7.0 (GLOVE) ×3 IMPLANT
GLOVE BIOGEL PI INDICATOR 7.0 (GLOVE) ×2
GLOVE EUDERMIC 7 POWDERFREE (GLOVE) ×10 IMPLANT
GOWN STRL REUS W/ TWL LRG LVL3 (GOWN DISPOSABLE) ×3 IMPLANT
GOWN STRL REUS W/ TWL XL LVL3 (GOWN DISPOSABLE) ×3 IMPLANT
GOWN STRL REUS W/TWL LRG LVL3 (GOWN DISPOSABLE) ×2
GOWN STRL REUS W/TWL XL LVL3 (GOWN DISPOSABLE) ×2
INTRAOPERATIVE PROVE COVER ×10 IMPLANT
KIT BASIN OR (CUSTOM PROCEDURE TRAY) ×5 IMPLANT
KIT MARKER MARGIN INK (KITS) ×10 IMPLANT
KIT ROOM TURNOVER OR (KITS) ×5 IMPLANT
NDL SAFETY ECLIPSE 18X1.5 (NEEDLE) ×3 IMPLANT
NEEDLE HYPO 18GX1.5 SHARP (NEEDLE) ×2
NEEDLE HYPO 25GX1X1/2 BEV (NEEDLE) ×10 IMPLANT
NEEDLE HYPO 25X1 1.5 SAFETY (NEEDLE) ×5 IMPLANT
NS IRRIG 1000ML POUR BTL (IV SOLUTION) ×5 IMPLANT
PACK GENERAL/GYN (CUSTOM PROCEDURE TRAY) ×5 IMPLANT
PAD ARMBOARD 7.5X6 YLW CONV (MISCELLANEOUS) ×5 IMPLANT
PENCIL BUTTON HOLSTER BLD 10FT (ELECTRODE) ×5 IMPLANT
SPONGE LAP 18X18 X RAY DECT (DISPOSABLE) ×5 IMPLANT
SUT MNCRL AB 4-0 PS2 18 (SUTURE) ×20 IMPLANT
SUT SILK 2 0 SH (SUTURE) ×10 IMPLANT
SUT VIC AB 2-0 CT1 18 (SUTURE) ×5 IMPLANT
SUT VIC AB 3-0 SH 18 (SUTURE) ×10 IMPLANT
SYR BULB 3OZ (MISCELLANEOUS) ×5 IMPLANT
SYR CONTROL 10ML LL (SYRINGE) ×10 IMPLANT
TOWEL OR 17X24 6PK STRL BLUE (TOWEL DISPOSABLE) ×5 IMPLANT
TOWEL OR 17X26 10 PK STRL BLUE (TOWEL DISPOSABLE) ×5 IMPLANT

## 2015-01-15 NOTE — Interval H&P Note (Signed)
History and Physical Interval Note:  01/15/2015 9:41 AM  Kristin Pennington  has presented today for surgery, with the diagnosis of bilateral breast cancer  The various methods of treatment have been discussed with the patient and family. After consideration of risks, benefits and other options for treatment, the patient has consented to  Procedure(s): LEFT BREAST LUMPECTOMY WITH RADIOACTIVE SEED LOCALIZATION (Left) RIGHT BREAST LUMPECTOMY WITH RADIOACTIVE SEED AND SENTINEL LYMPH NODE BIOPSY (Right) REMOVAL PORT-A-CATH (N/A) as a surgical intervention .  The patient's history has been reviewed, patient examined, no change in status, stable for surgery.  I have reviewed the patient's chart and labs.  Questions were answered to the patient's satisfaction.     [name]

## 2015-01-15 NOTE — Transfer of Care (Signed)
Immediate Anesthesia Transfer of Care Note  Patient: Kristin Pennington  Procedure(s) Performed: Procedure(s): LEFT BREAST LUMPECTOMY WITH RADIOACTIVE SEED LOCALIZATION (Left) RIGHT BREAST LUMPECTOMY WITH RADIOACTIVE SEED AND SENTINEL LYMPH NODE BIOPSY (Right) REMOVAL PORT-A-CATH (Left)  Patient Location: PACU  Anesthesia Type:General  Level of Consciousness: awake, patient cooperative and responds to stimulation  Airway & Oxygen Therapy: Patient Spontanous Breathing and Patient connected to face mask oxygen  Post-op Assessment: Report given to RN, Post -op Vital signs reviewed and stable and Patient moving all extremities X 4  Post vital signs: Reviewed and stable  Last Vitals:  Filed Vitals:   01/15/15 1300  BP: 125/81  Pulse: 87  Temp: 36.6 C  Resp: 19    Complications: No apparent anesthesia complications

## 2015-01-15 NOTE — Anesthesia Procedure Notes (Addendum)
Procedure Name: LMA Insertion Date/Time: 01/15/2015 10:48 AM Performed by: Tressia Miners LEFFEW Pre-anesthesia Checklist: Patient identified, Emergency Drugs available, Suction available, Timeout performed and Patient being monitored Patient Re-evaluated:Patient Re-evaluated prior to inductionOxygen Delivery Method: Circle system utilized Preoxygenation: Pre-oxygenation with 100% oxygen Intubation Type: IV induction Ventilation: Mask ventilation without difficulty LMA: LMA inserted LMA Size: 3.0 Number of attempts: 1 Tube secured with: Tape Dental Injury: Teeth and Oropharynx as per pre-operative assessment     Anesthesia Regional Block:  Pectoralis block  Pre-Anesthetic Checklist: ,, timeout performed, Correct Patient, Correct Site, Correct Laterality, Correct Procedure, Correct Position, site marked, Risks and benefits discussed,  Surgical consent,  Pre-op evaluation,  At surgeon's request and post-op pain management  Laterality: Right     Needles:  Injection technique: Single-shot  Needle Type: Echogenic Stimulator Needle     Needle Length: 9cm 9 cm Needle Gauge: 21 and 21 G    Additional Needles: Pectoralis block Narrative:  Start time: 01/15/2015 10:15 AM End time: 01/15/2015 10:20 AM Injection made incrementally with aspirations every 5 mL.  Performed by: Personally   Additional Notes: 30 cc 0.5% Bupivacaine  With 1:200 Epi injected easily

## 2015-01-15 NOTE — Discharge Instructions (Signed)
[facility] °Office Phone Number [phone] ° °BREAST BIOPSY/ PARTIAL MASTECTOMY: POST OP INSTRUCTIONS ° °Always review your discharge instruction sheet given to you by the facility where your surgery was performed. ° °IF YOU HAVE DISABILITY OR FAMILY LEAVE FORMS, YOU MUST BRING THEM TO THE OFFICE FOR PROCESSING.  DO NOT GIVE THEM TO YOUR DOCTOR. ° °1. A prescription for pain medication may be given to you upon discharge.  Take your pain medication as prescribed, if needed.  If narcotic pain medicine is not needed, then you may take acetaminophen (Tylenol) or ibuprofen (Advil) as needed. °2. Take your usually prescribed medications unless otherwise directed °3. If you need a refill on your pain medication, please contact your pharmacy.  They will contact our office to request authorization.  Prescriptions will not be filled after 5pm or on week-ends. °4. You should eat very light the first 24 hours after surgery, such as soup, crackers, pudding, etc.  Resume your normal diet the day after surgery. °5. Most patients will experience some swelling and bruising in the breast.  Ice packs and a good support bra will help.  Swelling and bruising can take several days to resolve.  °6. It is common to experience some constipation if taking pain medication after surgery.  Increasing fluid intake and taking a stool softener will usually help or prevent this problem from occurring.  A mild laxative (Milk of Magnesia or Miralax) should be taken according to package directions if there are no bowel movements after 48 hours. °7. Unless discharge instructions indicate otherwise, you may remove your bandages 24-48 hours after surgery, and you may shower at that time.  You may have steri-strips (small skin tapes) in place directly over the incision.  These strips should be left on the skin for 7-10 days.  If your surgeon used skin glue on the incision, you may shower in 24 hours.  The glue will flake off over the  next 2-3 weeks.  Any sutures or staples will be removed at the office during your follow-up visit. °8. ACTIVITIES:  You may resume regular daily activities (gradually increasing) beginning the next day.  Wearing a good support bra or sports bra minimizes pain and swelling.  You may have sexual intercourse when it is comfortable. °a. You may drive when you no longer are taking prescription pain medication, you can comfortably wear a seatbelt, and you can safely maneuver your car and apply brakes. °b. RETURN TO WORK:  ______________________________________________________________________________________ °9. You should see your doctor in the office for a follow-up appointment approximately two weeks after your surgery.  Your doctor’s nurse will typically make your follow-up appointment when she calls you with your pathology report.  Expect your pathology report 2-3 business days after your surgery.  You may call to check if you do not hear from us after three days. °10. OTHER INSTRUCTIONS: _______________________________________________________________________________________________ _____________________________________________________________________________________________________________________________________ °_____________________________________________________________________________________________________________________________________ °_____________________________________________________________________________________________________________________________________ ° °WHEN TO CALL YOUR DOCTOR: °1. Fever over 101.0 °2. Nausea and/or vomiting. °3. Extreme swelling or bruising. °4. Continued bleeding from incision. °5. Increased pain, redness, or drainage from the incision. ° °The clinic staff is available to answer your questions during regular business hours.  Please don’t hesitate to call and ask to speak to one of the nurses for clinical concerns.  If you have a medical emergency, go to the nearest  emergency room or call 911.  A surgeon from [facility] is always on call at the hospital. ° °For further questions, please visit [email or web address]  °

## 2015-01-15 NOTE — Op Note (Signed)
Patient Name:           Kristin Pennington   Date of Surgery:        01/15/2015  Pre op Diagnosis:      Invasive cancer right breast, upper inner quadrant, poorly differentiated, triple negative breast cancer                                      Ductal carcinoma in situ, left breast, upper outer quadrant, estrogen receptor positive                                      History Port-A-Cath placement                                      History neoadjuvant chemotherapy  Post op Diagnosis:    Same  Procedure:                 Inject blue dye right breast                                      Right partial mastectomy with radioactive seed localization                                      Right axillary sentinel lymph node biopsy                                      Left partial mastectomy with radioactive seed localization                                      Removal of Port-A-Cath  Surgeon:                     Edsel Petrin. Dalbert Batman, M.D., FACS  Assistant:                      OR staff  Operative Indications:   The patient is a 69 year old female who presents with breast cancer. She returns following completion of her neoadjuvant chemotherapy to discuss definitive breast surgery.       This 69 year old woman was initially evaluated on June 18, 2014 in the [facility] by Dr. Burr Medico, Dr. Pablo Ledger, and me. She felt a mass in the upper inner quadrant of the right breast. Imaging studies and biopsy show a 2.9 cm mass in the right breast upper inner quadrant, 1 o'clock position, posterior third. Image guided biopsy showed a very poorly differentiated cancer, presumably breast primary, triple negative breast primary.      Subsequent MRI revealed an area of enhancement in the left breast, upper outer quadrant which was biopsied and showed receptor positive ductal carcinoma in situ. We chose to insert a Port-A-Cath and give her neoadjuvant chemotherapy.        PET scan showed positive uptake in the right axilla,  medial right chest  wall, left supraclavicular lymph node, hilar lymph nodes, and periportal area. The left axilla looked normal. Biopsy of the left supraclavicular lymph node was negative. Bronchoscopic biopsy of the hilar area was negative. It is difficult to interpret the significance of her PET scan. Clinically she has partially responded to the chemotherapy. She says the right breast lump is much more difficult to find. The MRI shows that the previously noted mass which was 3.6 cm by MRI is now down to 2.2 cm. The enhancement in the left breast is gone. There are no enlarged lymph nodes. This looks like locallized disease in each breast. The tumor in the right breast is a high-grade tumor, however.      End of treatment PET scan shows that there is no abnormal uptake other than in the right breast. She has also been referred for genetic counseling. Comorbidities include seizure disorder, appendectomy, hysterectomy and BSO, open cholecystectomy. She is married. Denies tobacco. Lives in [place] and is hoping her radiation therapy can be done in [place].. Since her last visit she has learned that a maternal aunt had breast cancer and a maternal cousin had breast cancer. We spent a very long time discussing surgical management of her bilateral breast cancer. We talked about lumpectomy, mastectomy, sentinel node biopsy. I told her that I did not think there was any survival advantage to mastectomy and that my bias was toward bilateral lumpectomy and right axillary sentinel node biopsy and removal of her Port-A-Cath. She knows that she will need bilateral whole breast radiation therapy and she might be offered antiestrogen therapy as well. She is aware that her medical oncologist and radiation oncologist will make these decisions.   After lengthy discussion she decided to go ahead and schedule that surgery She will be scheduled for Port-A-Cath removal, right lumpectomy  with radioactive seed localization, right axillary sentinel node biopsy, left breast lumpectomy with radioactive seed localization. We do not need to do any lymph node work on the left side.  Operative Findings:       The Port-A-Cath was removed uneventfully.  The specimen mammogram of the left breast lumpectomy looked good with the marker clip and radioactive seed in the center of the specimen with apparently good margins.  Likewise, the specimen mammogram of the right breast, which was much larger in size, looked good with the tumor mass, marker clip and seed in the center of the specimen with apparently good margins all around.  On the right side the tumor was posterior instructed the dissection down through the pectoralis fascia and actually into the muscle a little bit.  The posterior margin is therefore the pectoralis muscle itself.  I found what appeared to be 3 sentinel lymph nodes.  Procedure in Detail:          The patient underwent radioactive seed placement into her left breast cancer and her right breast cancer area last week.  The imaging studies show good placement of the seeds.  The patient was brought to the holding area.  She underwent right pectoral block by Dr. Linna Caprice.  She underwent injection of technetium 99 radionuclide into the right breast by the nuclear medicine technician.  She was then taken to the operating room and underwent general anesthesia with an LMA  device.  Surgical timeout was performed and intravenous antibiotics were given.  Following alcohol prep I injected 5 mL of blue dye into the right breast subareolar area.  This was methylene blue mixed with saline.  The  breast was massaged for a few minutes.  We then prepped and draped both breasts, both axilla and the neck with ChloraPrep.  0.25% Marcaine with epinephrine was used as a local infiltration anesthetic.      I first removed the Port-A-Cath by making a transverse incision in the left infraclavicular area, through  the old scar.  I dissected down to the port.  I cut and removed all 3 Prolene sutures.  The port and catheter were removed intact.  There was no bleeding.  The subcutaneous tissue was closed with interrupted 3-0 Vicryl and the skin closed with a running subcuticular 4-0 Monocryl.     Using the neoprobe, I identified the area of maximum radioactivity in the left breast, high in the upper outer quadrant.  I marked a curvilinear incision in the upper outer quadrant.  This was at least 12 cm away from the areolar margin.  The curvilinear incision was made.  Using the neoprobe frequently I dissected down into the breast tissue and around the radioactivity.  The specimen was removed and marked with silk sutures and a 6 color ink kit to orient the pathologist.  Specimen mammogram looked very good as mentioned above.  The specimen was sent to the lab.  The wound was irrigated with saline.  Hemostasis was excellent.  The lumpectomy cavity was marked with metal clips.  The deeper breast tissues were reapproximated with interrupted sutures of 3-0 Vicryl and the skin closed with a running subcuticular suture of 4-0 Monocryl.      I then turned my attention to the right breast lumpectomy.  Using the neoprobe I  identified the area of maximum radioactivity.  This was actually high in the right breast at about the 1:00 position.  I marked a curvilinear incision.  I made the incision taking a narrow ellipse of skin.  Knowing that the tumor was fairly large prior to neoadjuvant chemotherapy, I did a fairly generous resection, using the neoprobe frequently.  I took the resection all the way down to the pectoralis muscle.  I resected pectoralis fascia and a little bit of muscle and the posterior margin was fairly broad.  The specimen was removed and marked with silk sutures and the 6 color ink kit in a similar fashion.  The specimen mammogram looked very good and the specimen was sent to the lab.  Hemostasis was excellent and  achieved with electrocautery.  I marked the lumpectomy cavity with several metal clips.  I reapproximated the breast tissues with numerous Vicryl  sutures.  I tacked the breast tissue down to the pectoralis fascia.  Then in a more superficial plane I closed the breast tissue with Vicryl sutures.  I then closed the skin with a running subcuticular 4-0 Monocryl.     In the right axilla I identified the area of maximum radioactivity on the technetium 99 scale.  Curvilinear incision was made in this skin crease at the hairline.  Dissection was carried down into the axilla.  I found a 2 obvious sentinel lymph nodes and one smaller area of radioactivity with blue dye which might have been a tiny lymph node or lymphatic.  All 3 were sent as separate specimens.  Hemostasis was excellent and achieved with electrocautery.  The axillary wound was irrigated with saline.  The deeper tissues were closed with interrupted Vicryl sutures and the skin closed with a running subcuticular 4-0 Monocryl.  All 4 incisions were painted with Dermabond.  Dry bandages and a breast binder  were placed.  The patient tolerated the procedure well was taken to PACU in stable condition.  EBL 25-30 mL or less.  Counts correct.  Complications none.               Edsel Petrin. Dalbert Batman, M.D., FACS General and Minimally Invasive Surgery Breast and Colorectal Surgery  01/15/2015 1:08 PM

## 2015-01-15 NOTE — Anesthesia Postprocedure Evaluation (Signed)
  Anesthesia Post-op Note  Patient: Kristin Pennington  Procedure(s) Performed: Procedure(s): LEFT BREAST LUMPECTOMY WITH RADIOACTIVE SEED LOCALIZATION (Left) RIGHT BREAST LUMPECTOMY WITH RADIOACTIVE SEED AND SENTINEL LYMPH NODE BIOPSY (Right) REMOVAL PORT-A-CATH (Left)  Patient Location: PACU  Anesthesia Type:General  Level of Consciousness: awake and alert   Airway and Oxygen Therapy: Patient Spontanous Breathing  Post-op Pain: mild  Post-op Assessment: Post-op Vital signs reviewed              Post-op Vital Signs: Reviewed  Last Vitals:  Filed Vitals:   01/15/15 1405  BP: 124/68  Pulse: 70  Temp:   Resp: 12    Complications: No apparent anesthesia complications

## 2015-01-15 NOTE — Progress Notes (Signed)
Report given to caroline rn as caregiver 

## 2015-01-15 NOTE — H&P (Signed)
Kristin Pennington  Location: [facility] Patient #: [id number] DOB: [birth year] Married / Language: English / Race: White Female       History of Present Illness  The patient is a 69 year old female who presents with breast cancer. She returns following completion of her neoadjuvant chemotherapy to discuss definitive breast surgery.        This 69 year old woman was initially evaluated on June 18, 2014 in the [facility] by Dr. Burr Medico, Dr. Pablo Ledger, and me. She felt a mass in the upper inner quadrant of the right breast. Imaging studies and biopsy show a 2.9 cm mass in the right breast upper inner quadrant, 1 o'clock position, posterior third. Image guided biopsy showed a very poorly differentiated cancer, presumably breast primary, triple negative breast primary.        Subsequent MRI revealed an area of enhancement in the left breast, upper outer quadrant which was biopsied and showed receptor positive ductal carcinoma in situ. We chose to insert a Port-A-Cath and give her neoadjuvant chemotherapy.        PET scan showed positive uptake in the right axilla, medial right chest wall, left supraclavicular lymph node, hilar lymph nodes, and periportal area. The left axilla looked normal. Biopsy of the left supraclavicular lymph node was negative. Bronchoscopic biopsy of the hilar area was negative. It is difficult to interpret the significance of her PET scan. Clinically she has partially responded to the chemotherapy. She says the right breast lump is much more difficult to find. The MRI shows that the previously noted mass which was 3.6 cm by MRI is now down to 2.2 cm. The enhancement in the left breast is gone. There are no enlarged lymph nodes. This looks like locallized disease in each breast. The tumor in the right breast is a high-grade tumor, however.   Dr. Burr Medico plans to repeat her PET scan on Oct. 6. She has also been referred for genetic counseling.  Comorbidities include seizure disorder, appendectomy, hysterectomy and BSO, open cholecystectomy. She is married. Denies tobacco. Lives in [place] and is hoping her radiation therapy can be done and eaten. Since her last visit she has learned that a maternal aunt had breast cancer and a maternal cousin had breast cancer. We spent a very long time discussing surgical management of her bilateral breast cancer. We talked about lumpectomy, mastectomy, sentinel node biopsy. I told her that I did not think there was any survival advantage to mastectomy and that my bias was toward bilateral lumpectomy and right axillary sentinel node biopsy and removal of her Port-A-Cath. She knows that she will need bilateral whole breast radiation therapy and she might be offered antiestrogen therapy as well. She is aware that her medical oncologist and radiation oncologist will make these decisions.             After lengthy discussion she decided to go ahead and schedule that surgery She will be scheduled for Port-A-Cath removal, right lumpectomy with radioactive seed localization, right axillary sentinel node biopsy, left breast lumpectomy with radioactive seed localization. We do not need to do any lymph node work on the left side. I discussed the indications, details, techniques, and numerous risk of the surgery with her. She is aware of the risk of bleeding, infection, nerve damage with chronic pain or numbness, cosmetic deformity, reoperation for positive margins or positive nodes, cardiac pulmonary and thromboembolic problems. She understands  Port-A-Cath removal is usually straightforward but occasionally difficult. At this time all of  her questions were answered. She understands all these issues. She agrees with this plan.   Allergies  Sulfa Antibiotics Pyridium *GENITOURINARY AGENTS - MISCELLANEOUS* Tape 1"X5yd *MEDICAL DEVICES AND SUPPLIES* Tetracycline  *CHEMICALS* Nitrofurantoin *URINARY ANTI-INFECTIVES* Codeine Phosphate *ANALGESICS - OPIOID* Aspirin *ANALGESICS - NonNarcotic* Meloxicam *ANALGESICS - ANTI-INFLAMMATORY* Diclofenac *ANALGESICS - ANTI-INFLAMMATORY*  Medication History  Vitamin B12 (100MCG Tablet, Oral) Active. Calcium 600 (1500 (600 Ca)MG Tablet, Oral) Active. Ocean Nasal Spray (0.65% Solution, Nasal) Active. TraMADol HCl (50MG  Tablet, Oral) Active. Aspirin (81MG  Tablet Chewable, Oral) Active. Zolpidem Tartrate (5MG  Tablet, Oral) Active. Dilantin (100MG  Capsule, Oral) Active. Estradiol (0.075MG /24HR Patch Weekly, Transdermal) Active. Ondansetron HCl (8MG  Tablet, Oral) Active. Oxaprozin (600MG  Tablet, Oral) Active. Prochlorperazine Maleate (10MG  Tablet, Oral) Active. Lidocaine-Prilocaine (2.5-2.5% Cream, External) Active. Hydrocodone-Acetaminophen (5-325MG  Tablet, Oral as needed) Active. Medications Reconciled  Vitals   Weight: 168 lb Height: 64in Body Surface Area: 1.85 m Body Mass Index: 28.84 kg/m  Temp.: 22F  Pulse: 76 (Regular)  BP: 138/76 (Sitting, Left Arm, Standard)     Physical Exam  General Mental Status-Alert. General Appearance-Not in acute distress. Build & Nutrition-Well nourished. Posture-Normal posture. Gait-Normal.  Head and Neck Head-normocephalic, atraumatic with no lesions or palpable masses. Trachea-midline. Thyroid Gland Characteristics - normal size and consistency and no palpable nodules.  Chest and Lung Exam Chest and lung exam reveals -on auscultation, normal breath sounds, no adventitious sounds and normal vocal resonance.  Breast Note: Right breast exam reveals a less distinct area of thickening in the upper inner quadrant. No mass in the left breast. No skin changes. No discoloration. No axillary adenopathy on either side   Cardiovascular Cardiovascular examination reveals -normal heart sounds, regular rate and  rhythm with no murmurs and femoral artery auscultation bilaterally reveals normal pulses, no bruits, no thrills.  Abdomen Inspection Inspection of the abdomen reveals - No Hernias. Palpation/Percussion Palpation and Percussion of the abdomen reveal - Soft, Non Tender, No Rigidity (guarding), No hepatosplenomegaly and No Palpable abdominal masses. Note: right subcostal scr well healed   Neurologic Neurologic evaluation reveals -alert and oriented x 3 with no impairment of recent or remote memory, normal attention span and ability to concentrate, normal sensation and normal coordination.  Musculoskeletal Normal Exam - Bilateral-Upper Extremity Strength Normal and Lower Extremity Strength Normal.    Assessment & Plan   PRIMARY CANCER OF UPPER INNER QUADRANT OF RIGHT FEMALE BREAST (C50.211) Impression: Poorly differentiated triple negative breast cancer Current Plans You are being scheduled for surgery - Our schedulers will call you. You have completed chemotherapy and you have done very well you state that Dr. Burr Medico says we can remove the Port-A-Cath Your end of treatment MRI shows that the right breast cancer has partially responded and is somewhat smaller Your left breast cancer is not visible on MRI. We have discussed the options of lumpectomy, mastectomy, and sentinel node biopsy. After lengthy discussion of the pros and cons of each approach, we have decided to proceed with bilateral lumpectomy with radioactive seed localization, right axillary sentinel node biopsy, and removal of your Port-A-Cath We have discussed the techniques, indications, and risks of all of this surgery Please read over the printed information that I have given you. Be sure to keep your appointment for your PET scan on October 6 Give me a call with any new questions,     PRIMARY CANCER OF UPPER OUTER QUADRANT OF LEFT BREAST (C50.412) Impression: Receptor positive DCIS  CARCINOMA OF BREAST TREATED  WITH ADJUVANT CHEMOTHERAPY, RIGHT (C50.911) HISTORY OF SEIZURE DISORDER (  Z86.69) HISTORY OF TOTAL ABDOMINAL HYSTERECTOMY AND BILATERAL SALPINGO-OOPHORECTOMY (Z90.710) HISTORY OF APPENDECTOMY (Z90.49) HISTORY OF CHOLECYSTECTOMY (Z90.49)    Edsel Petrin. Dalbert Batman, M.D., [facility] General and Minimally invasive Surgery Breast and Colorectal Surgery Office:   [phone] Pager:   [phone]

## 2015-01-15 NOTE — Anesthesia Preprocedure Evaluation (Signed)
Anesthesia Evaluation  Patient identified by MRN, date of birth, ID band Patient awake    Reviewed: Allergy & Precautions, NPO status , Patient's Chart, lab work & pertinent test results  Airway Mallampati: II  TM Distance: >3 FB Neck ROM: Full    Dental  (+) Edentulous Upper, Dental Advisory Given   Pulmonary former smoker,    breath sounds clear to auscultation       Cardiovascular  Rhythm:Regular Rate:Normal     Neuro/Psych    GI/Hepatic   Endo/Other    Renal/GU      Musculoskeletal   Abdominal   Peds  Hematology   Anesthesia Other Findings   Reproductive/Obstetrics                             Anesthesia Physical Anesthesia Plan  ASA: III  Anesthesia Plan: General   Post-op Pain Management: GA combined w/ Regional for post-op pain   Induction: Intravenous  Airway Management Planned: LMA  Additional Equipment:   Intra-op Plan:   Post-operative Plan:   Informed Consent: I have reviewed the patients History and Physical, chart, labs and discussed the procedure including the risks, benefits and alternatives for the proposed anesthesia with the patient or authorized representative who has indicated his/her understanding and acceptance.   Dental advisory given  Plan Discussed with: CRNA and Anesthesiologist  Anesthesia Plan Comments: (R. Breast ca S/P chemotherapy  Longstanding seizure disorder well controlled with dilantin  Plan GA with LMA and pectoralis block  Roberts Gaudy)        Anesthesia Quick Evaluation

## 2015-01-16 ENCOUNTER — Encounter (HOSPITAL_COMMUNITY): Payer: Self-pay | Admitting: General Surgery

## 2015-01-21 DIAGNOSIS — M792 Neuralgia and neuritis, unspecified: Secondary | ICD-10-CM | POA: Diagnosis not present

## 2015-01-21 DIAGNOSIS — F330 Major depressive disorder, recurrent, mild: Secondary | ICD-10-CM | POA: Diagnosis not present

## 2015-01-21 DIAGNOSIS — Z7982 Long term (current) use of aspirin: Secondary | ICD-10-CM | POA: Diagnosis not present

## 2015-01-21 DIAGNOSIS — G40209 Localization-related (focal) (partial) symptomatic epilepsy and epileptic syndromes with complex partial seizures, not intractable, without status epilepticus: Secondary | ICD-10-CM | POA: Diagnosis not present

## 2015-01-27 ENCOUNTER — Encounter: Payer: Self-pay | Admitting: Genetic Counselor

## 2015-01-27 ENCOUNTER — Telehealth: Payer: Self-pay | Admitting: Genetic Counselor

## 2015-01-27 ENCOUNTER — Ambulatory Visit: Payer: Self-pay | Admitting: Genetic Counselor

## 2015-01-27 DIAGNOSIS — Z1379 Encounter for other screening for genetic and chromosomal anomalies: Secondary | ICD-10-CM

## 2015-01-27 DIAGNOSIS — C50211 Malignant neoplasm of upper-inner quadrant of right female breast: Secondary | ICD-10-CM

## 2015-01-27 NOTE — Progress Notes (Signed)
HPI: Ms. Liptak was previously seen in the [facility] clinic due to a personal and family history of breast cancer and concerns regarding a hereditary predisposition to cancer. Please refer to our prior cancer genetics clinic note for more information regarding Ms. Redlich's medical, social and family histories, and our assessment and recommendations, at the time. Ms. Moisan's recent genetic test results were disclosed to her, as were recommendations warranted by these results. These results and recommendations are discussed in more detail below.  FAMILY HISTORY:  We obtained a detailed, 4-generation family history.  Significant diagnoses are listed below: Family History  Problem Relation Age of Onset  . Stomach cancer Father 70  . Lung cancer Sister 70    not sure if primary or breast cancer met  . Breast cancer Maternal Aunt 70  . Breast cancer Cousin 70  . Heart Problems Paternal Aunt     The patient has one son who is cancer free, and two sisters who are both deceased. One died in a MVA and the other died of lung cancer. Ms. Desantiago reports that her sister may have been diagnosed with breast cancer about 1.5 years before she died and therefore it is unclear if her lung cancer was a result of mets or a lung primary from smoking. Ms. Pell's parents are both deceased. Her father died of stomach cancer and her mother from heart problems. Her mother had five brothers and two sisters. One brother died in infancy, and one sister was diagnosed with breast cancer at 83 This sister has a daughter who was recently diagnosed with breast cancer, and another cousin was diagnosed with cervical cancer. Reportedly both of these individuals had genetic testing that was "Negative". There is no other reported cancer on either side of the family. Patient's maternal ancestors are of [place] descent, and paternal ancestors are of [place] descent. There is no reported Ashkenazi Jewish  ancestry. There is no known consanguinity.  GENETIC TEST RESULTS: At the time of Ms. Spear's visit, we recommended she pursue genetic testing of the Breast/Ovarian cancer gene panel. The Breast/Ovarian gene panel offered by [facility] includes sequencing and rearrangement analysis for the following 20 genes:  ATM, BARD1, BRCA1, BRCA2, BRIP1, CDH1, CHEK2, EPCAM, FANCC, MLH1, MSH2, MSH6, NBN, PALB2, PMS2, PTEN, RAD51C, RAD51D, TP53, and XRCC2.   The report date is January 23, 2015.  Genetic testing was normal, and did not reveal a deleterious mutation in these genes. The test report has been scanned into EPIC and is located under the Molecular Pathology section of the Results Review tab.   We discussed with Ms. Geurts that since the current genetic testing is not perfect, it is possible there may be a gene mutation in one of these genes that current testing cannot detect, but that chance is small. We also discussed, that it is possible that another gene that has not yet been discovered, or that we have not yet tested, is responsible for the cancer diagnoses in the family, and it is, therefore, important to remain in touch with cancer genetics in the future so that we can continue to offer Ms. Rayfield the most up to date genetic testing.   CANCER SCREENING RECOMMENDATIONS: This result is reassuring and indicates that Ms. Chuba likely does not have an increased risk for a future cancer due to a mutation in one of these genes. This normal test also suggests that Ms. Lippard's cancer was most likely not due to an inherited predisposition associated with one  of these genes.  Most cancers happen by chance and this negative test suggests that her cancer falls into this category.  We, therefore, recommended she continue to follow the cancer management and screening guidelines provided by her oncology and primary healthcare provider.   RECOMMENDATIONS FOR FAMILY MEMBERS: Women in this family might be at some  increased risk of developing cancer, over the general population risk, simply due to the family history of cancer. We recommended women in this family have a yearly mammogram beginning at age 48, or 6 years younger than the earliest onset of cancer, an an annual clinical breast exam, and perform monthly breast self-exams. Women in this family should also have a gynecological exam as recommended by their primary provider. All family members should have a colonoscopy by age 28.  FOLLOW-UP: Lastly, we discussed with Ms. Mellette that cancer genetics is a rapidly advancing field and it is possible that new genetic tests will be appropriate for her and/or her family members in the future. We encouraged her to remain in contact with cancer genetics on an annual basis so we can update her personal and family histories and let her know of advances in cancer genetics that may benefit this family.   Our contact number was provided. Ms. Helin's questions were answered to her satisfaction, and she knows she is welcome to call us at anytime with additional questions or concerns.   Roma Kayser, MS, [facility] Certified Genetic Counselor [email or web address]

## 2015-01-27 NOTE — Telephone Encounter (Signed)
Revealed negative genetic testing.  Explained that it would be important to keep in contact with genetics so that if additional testing becomes available she would know about it.

## 2015-01-27 NOTE — Telephone Encounter (Signed)
LM on VM at home and mobile with good news on genetic test results.  Provided CB instructions.

## 2015-01-28 NOTE — Progress Notes (Signed)
Location of Breast Cancer: Right Breast/ Left Breast  Histology per Pathology Report:   01/15/15 Diagnosis 1. Breast, lumpectomy, Left - FIBROCYSTIC CHANGE WITH ADENOSIS AND CALCIFICATIONS. - TREATMENT RELATED CHANGES. - THERE IS NO EVIDENCE OF MALIGNANCY. - SEE COMMENT. 2. Breast, lumpectomy, Right - FIBROCYSTIC CHANGES WITH ADENOSIS. - TREATMENT RELATED CHANGES. - THERE IS NO EVIDENCE OF MALIGNANCY. - SEE COMMENT. 3. Lymph node, sentinel, biopsy, right axillary #1 - THERE IS NO EVIDENCE OF CARCINOMA IN 1 OF 1 LYMPH NODE (0/1). - SEE COMMENT 4. Lymph node, sentinel, biopsy, right axillary #2 - THERE IS NO EVIDENCE OF CARCINOMA IN 1 OF 1 LYMPH NODE (0/1). - SEE COMMENT. 5. Lymph node, sentinel, biopsy, right axillary #3 - THERE IS NO EVIDENCE OF CARCINOMA IN 1 OF 1 LYMPH NODE (0/1).  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   06/05/14 Diagnosis Breast, right, needle core biopsy - INVASIVE MAMMARY CARCINOMA, Receptor Status: ER(-), PR (-), Her2-neu (-)  Did patient present with symptoms (if so, please note symptoms) or was this found on screening mammography?: She found the lump on her own.   Past/Anticipated interventions by surgeon, if any: 01/15/15 Right partial mastectomy with radioactive seed localization  Right axillary sentinel lymph node biopsy  Left partial mastectomy with radioactive seed localization By Dr. Dalbert Batman  Past/Anticipated interventions by medical oncology, if any: She saw Dr. Burr Medico 10/13. She has an appointment to see Dr. Burr Medico at 8:30 today (01/29/15)  Lymphedema issues, if any:    Pain issues, if any: Bilateral lower extremity pain 5/10  SAFETY ISSUES:  Prior radiation? no  Pacemaker/ICD? no  Possible current pregnancy? No             Is the patient on methotrexate? No  Current Complaints / other details:  Seizure hx, family hx  of cancer    Malmfelt, Stephani Police, RN 01/28/2015,3:39 PM

## 2015-01-29 ENCOUNTER — Ambulatory Visit
Admission: RE | Admit: 2015-01-29 | Discharge: 2015-01-29 | Disposition: A | Discharge status: Home / Self Care | Payer: Medicare Other | Source: Ambulatory Visit | Attending: Radiation Oncology | Admitting: Radiation Oncology

## 2015-01-29 ENCOUNTER — Encounter: Payer: Self-pay | Admitting: Radiation Oncology

## 2015-01-29 ENCOUNTER — Ambulatory Visit (HOSPITAL_BASED_OUTPATIENT_CLINIC_OR_DEPARTMENT_OTHER): Payer: Medicare Other | Admitting: Hematology

## 2015-01-29 ENCOUNTER — Encounter: Payer: Self-pay | Admitting: Hematology

## 2015-01-29 VITALS — BP 123/72 | HR 71 | Temp 97.7°F | Resp 20 | Ht 64.0 in | Wt 169.0 lb

## 2015-01-29 VITALS — BP 116/62 | HR 71 | Temp 97.5°F | Resp 12 | Wt 168.5 lb

## 2015-01-29 DIAGNOSIS — Z171 Estrogen receptor negative status [ER-]: Secondary | ICD-10-CM | POA: Insufficient documentation

## 2015-01-29 DIAGNOSIS — D701 Agranulocytosis secondary to cancer chemotherapy: Secondary | ICD-10-CM | POA: Diagnosis not present

## 2015-01-29 DIAGNOSIS — C50211 Malignant neoplasm of upper-inner quadrant of right female breast: Secondary | ICD-10-CM | POA: Insufficient documentation

## 2015-01-29 DIAGNOSIS — D6481 Anemia due to antineoplastic chemotherapy: Secondary | ICD-10-CM | POA: Diagnosis not present

## 2015-01-29 DIAGNOSIS — Z803 Family history of malignant neoplasm of breast: Secondary | ICD-10-CM

## 2015-01-29 DIAGNOSIS — D0512 Intraductal carcinoma in situ of left breast: Secondary | ICD-10-CM | POA: Insufficient documentation

## 2015-01-29 NOTE — Progress Notes (Signed)
[facility]  [phone] [phone]  Clinic Follow Up Note   Patient Care Team: Lonzo Cloud, MD as PCP - General (General Practice) Lonzo Cloud, MD (General Practice) Fanny Skates, MD as Consulting Physician (General Surgery) Truitt Merle, MD as Consulting Physician (Hematology) Thea Silversmith, MD as Consulting Physician (Radiation Oncology) Rockwell Germany, RN as Registered Nurse Mauro Kaufmann, RN as Registered Nurse Holley Bouche, NP as Nurse Practitioner (Nurse Practitioner) 01/29/2015  CHIEF COMPLAINTS:  Follow-up triple negative breast cancer  Oncology History   Breast cancer of upper-inner quadrant of right female breast   Staging form: Breast, AJCC 7th Edition     Clinical stage from 06/18/2014: Stage IIA (T2, N0, M0) - Unsigned       Staging comments: Staged at breast conference on 3.23.16        Breast cancer of upper-inner quadrant of right female breast ([place])   06/05/2014 Breast US ultrasound is performed, showing a 2.4 x 1.5 x 2.3 cm heterogeneous mass at the right breast 1 o'clock 15 cm from nipple palpable area correlating to the mammographic finding. Ultrasound of the right axilla is negative   06/05/2014 Pathology Results INVASIVE MAMMARY CARCINOMA   06/05/2014 Receptors her2 Estrogen Receptor: 0%, NEGATIVE Progesterone Receptor: 0%, NEGATIVE Proliferation Marker Ki67: 98%  HER-2/NEU BY CISH - NEGATIVE   06/11/2014 Initial Diagnosis Breast cancer of upper-inner quadrant of right female breast   07/02/2014 PET scan 1. Intensely hypermetabolic mass in the medial RIGHT chest wall with hypermetabolic right axillary node 2. Hypermetabolic nodal metastasis in the LEFT level 2 and LEFT supraclavicular and periportal node (SUV 5).    07/04/2014 Pathology Results Left breast mass biopsy showed DCIS, ER100%, PR 48%   07/10/2014 Pathology Results left [place] node biopsy was negative for malignant cells, benign reactive lymph tissue    07/18/2014 -   Chemotherapy Dose dense Adriamycin and Cytoxan, every 2 weeks, X4, followed by weekly carbo and Taxol for 12 weeks.   07/28/2014 Pathology Results Right hilar and are lymph node biopsy showed no malignant cells, mixed lymphoid population.   08/11/2014 Miscellaneous nadir CBC: WBC 0.54, hemoglobin 8.9, hematocrit 26%, platelet 125   01/15/2015 Surgery Bilateral lumpectomy and right sentinel lymph node biopsy.   01/15/2015 Pathology Results Bilateral breast lumpectomy showed fibrocystic change, treatment-related changes, no residual malignancy. 3 right axillary sentinel lymph nodes were negative.    HISTORY OF PRESENTING ILLNESS:  Kristin Pennington 69 y.o. female is here because of recently diagnosed poorly differentiated carcinoma in her right breast.  She found a lump in her right breast 3 weeks ago, mild tenderness,  No other complains. Last mammo was in oct 2015 which was negative. She feels well overall. She denies any pain, cough, shortness breast, GI symptoms. She has good energy level and appetite, no recent weight loss.  Her last colonoscopy was for 5 years ago which was negative per patient. Her last Pap smear was 3 years ago which was negative per patient.  INTERIM HISTORY: [name] returns for follow-up. She underwent bilateral lumpectomy on 01/15/2015 and tolerated the procedure very well. She is recovering well, minimal pain at the incision sites, no , swelling or issue is range of motion. She has good appetite and eating well, no other complaints. She is scheduled to see radiation oncologist Dr. Pablo Ledger today.  MEDICAL HISTORY:  Past Medical History  Diagnosis Date  . Wears glasses   . Wears partial dentures     top  . Arthritis  knee  . Breast cancer ([place])     bilateral, right breast mass, cancer both breast, x1 chemo  a week ago.  . Pneumonia     2015  . GERD (gastroesophageal reflux disease)     with chemo  . 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  Glaucoma     "early" - eye doctor is watching     SURGICAL HISTORY: Past Surgical History  Procedure Laterality Date  . Appendectomy    . Cholecystectomy    . Abdominal hysterectomy    . Tonsillectomy    . Colonoscopy    . Portacath placement N/A 06/30/2014    Procedure: INSERTION PORT-A-CATH WITH ULTRA SOUND ON STANDBY;  Surgeon: Fanny Skates, MD;  Location: [facility];  Service: General;  Laterality: N/A;  . Endobronchial ultrasound Bilateral 07/28/2014    Procedure: ENDOBRONCHIAL ULTRASOUND;  Surgeon: Collene Gobble, MD;  Location: [facility];  Service: Cardiopulmonary;  Laterality: Bilateral;  . Carpal tunnel release Left   . Breast lumpectomy with radioactive seed localization Left 01/15/2015    Procedure: LEFT BREAST LUMPECTOMY WITH RADIOACTIVE SEED LOCALIZATION;  Surgeon: Fanny Skates, MD;  Location: [facility];  Service: General;  Laterality: Left;  . Breast lumpectomy with radioactive seed and sentinel lymph node biopsy Right 01/15/2015    Procedure: RIGHT BREAST LUMPECTOMY WITH RADIOACTIVE SEED AND SENTINEL LYMPH NODE BIOPSY;  Surgeon: Fanny Skates, MD;  Location: [facility];  Service: General;  Laterality: Right;  . Port-a-cath removal Left 01/15/2015    Procedure: REMOVAL PORT-A-CATH;  Surgeon: Fanny Skates, MD;  Location: [facility];  Service: General;  Laterality: Left;   SOCIAL HISTORY: Social History   Social History  . Marital Status: Married    Spouse Name: N/A  . Number of Children: N/A  . Years of Education: N/A   Occupational History  . Not on file.   Social History Main Topics  . Smoking status: Former Smoker -- 1.00 packs/day for 18 years    Types: Cigarettes    Quit date: 11/08/1988  . Smokeless tobacco: Never Used  . Alcohol Use: No  . Drug Use: No  . Sexual Activity: Not on file   Other Topics Concern  . Not on file   Social History Narrative     GYN HISTORY  Menarchal: 11 LMP: 1994, hysrectomy  Contraceptive: no  HRT: since 62   G2P1, one miscarriage     FAMILY HISTORY: Family History  Problem Relation Age of Onset  . Stomach cancer Father 40  . Lung cancer Sister 79    not sure if primary or breast cancer met  . Breast cancer Maternal Aunt 72  . Breast cancer Cousin 62  . Heart Problems Paternal Aunt     ALLERGIES:  is allergic to aspirin; codeine; diclofenac; meloxicam; nitrofurantoin; other; pyridium; sulfa antibiotics; tetracyclines & related; and tape.  MEDICATIONS:  Current Outpatient Prescriptions  Medication Sig Dispense Refill  . aspirin EC 81 MG tablet Take 81 mg by mouth daily.     . Calcium Carb-Cholecalciferol (CALCIUM 600+D3) 600-400 MG-UNIT TABS Take 1 tablet by mouth 2 (two) times daily.    . cyanocobalamin (,VITAMIN B-12,) 1000 MCG/ML injection Inject 1,000 mcg into the muscle every 30 (thirty) days.     Marland Kitchen HYDROcodone-acetaminophen (NORCO) 5-325 MG tablet Take 1-2 tablets by mouth every 6 (six) hours as needed for moderate pain or severe pain. 30 tablet 0  . levETIRAcetam (KEPPRA) 500 MG tablet Take  1 tablet by mouth 2 (two) times daily.    Marland Kitchen oxaprozin (DAYPRO) 600 MG tablet Take 1 tablet by mouth 2 (two) times daily.    . phenytoin (DILANTIN) 100 MG ER capsule Take 1 tablet by mouth 4 (four) times daily. On  Sat , Sun, Tues, Wed, and  Thursdays    -  Take   144m  QID.    .Marland Kitchensodium chloride (OCEAN) 0.65 % SOLN nasal spray Place 1 spray into both nostrils as needed for congestion.    . Tetrahydrozoline HCl ([employer] EYE DROPS OP) Apply 1 drop to eye 3 (three) times daily.    . traMADol (ULTRAM) 50 MG tablet Take 50 mg by mouth every 6 (six) hours as needed.     . ondansetron (ZOFRAN) 8 MG tablet Take 8 mg by mouth every 8 (eight) hours as needed for nausea or vomiting.    . prochlorperazine (COMPAZINE) 10 MG tablet Take 1 tablet by mouth every 8 (eight) hours as needed for nausea or vomiting.   0   No current facility-administered medications for this visit.    REVIEW OF SYSTEMS:     Constitutional: Denies fevers, chills or abnormal night sweats Eyes: Denies blurriness of vision, double vision or watery eyes Ears, nose, mouth, throat, and face: Denies mucositis or sore throat Respiratory: Denies cough, dyspnea or wheezes Cardiovascular: Denies palpitation, chest discomfort or lower extremity swelling Gastrointestinal:  Denies nausea, heartburn or change in bowel habits Skin: Denies abnormal skin rashes Lymphatics: Denies new lymphadenopathy or easy bruising Neurological:Denies numbness, tingling or new weaknesses Behavioral/Psych: Mood is stable, no new changes  All other systems were reviewed with the patient and are negative.  PHYSICAL EXAMINATION: ECOG PERFORMANCE STATUS: 1  BP 123/72 mmHg  Pulse 71  Temp(Src) 97.7 F (36.5 C) (Oral)  Resp 20  Ht _0  (1.626 m)  Wt 169 lb (76.658 kg)  BMI 28.99 kg/m2  SpO2 100%  GENERAL:alert, no distress and comfortable SKIN: skin color, texture, turgor are normal, no rashes or significant lesions EYES: normal, conjunctiva are pink and non-injected, sclera clear OROPHARYNX:no exudate, no erythema and lips, buccal mucosa, and tongue normal  NECK: supple, thyroid normal size, non-tender, without nodularity LYMPH:  no palpable lymphadenopathy in the cervical, axillary or inguinal LUNGS: clear to auscultation and percussion with normal breathing effort HEART: regular rate & rhythm and no murmurs and no lower extremity edema ABDOMEN:abdomen soft, non-tender and normal bowel sounds Musculoskeletal:no cyanosis of digits and no clubbing, no significant right arm swelling PSYCH: alert & oriented x 3 with fluent speech NEURO: no focal motor/sensory deficits Breasts: Breast inspection showed them to be symmetrical with no nipple discharge.  (+) Surgical incision site in both breasts are healing well, no discharge or surrounding skin erythema. Some fullness underneath the scar. No other palpable mass or adenopathy.  LABORATORY  DATA:  I have reviewed the data as listed CBC Latest Ref Rng 01/08/2015 12/19/2014 12/12/2014  WBC 3.9 - 10.3 10e3/uL 3.4(L) 3.6(L) 3.1(L)  Hemoglobin 11.6 - 15.9 g/dL 11.2(L) 10.1(L) 10.8(L)  Hematocrit 34.8 - 46.6 % 32.6(L) 29.7(L) 30.7(L)  Platelets 145 - 400 10e3/uL 195 147 178    CMP Latest Ref Rng 01/08/2015 12/19/2014 12/12/2014  Glucose 70 - 140 mg/dl 86 103 93  BUN 7.0 - 26.0 mg/dL 15.4 19.5 13.0  Creatinine 0.6 - 1.1 mg/dL 0.8 0.7 0.7  Sodium 136 - 145 mEq/L 141 139 141  Potassium 3.5 - 5.1 mEq/L 4.0 3.6 4.3  Chloride 96 -  112 mmol/L - - -  CO2 22 - 29 mEq/L _0 Calcium 8.4 - 10.4 mg/dL 9.0 8.7 9.2  Total Protein 6.4 - 8.3 g/dL 6.8 6.9 6.9  Total Bilirubin 0.20 - 1.20 mg/dL <0.30 <0.30 <0.20  Alkaline Phos 40 - 150 U/L 109 99 114  AST 5 - 34 U/L _1 ALT 0 - 55 U/L _2 PATHOLOGY REPORT 06/05/2014 Diagnosis 01/15/2015 1. Breast, lumpectomy, Left - FIBROCYSTIC CHANGE WITH ADENOSIS AND CALCIFICATIONS. - TREATMENT RELATED CHANGES. - THERE IS NO EVIDENCE OF MALIGNANCY. - SEE COMMENT. 2. Breast, lumpectomy, Right - FIBROCYSTIC CHANGES WITH ADENOSIS. - TREATMENT RELATED CHANGES. - THERE IS NO EVIDENCE OF MALIGNANCY. - SEE COMMENT. 3. Lymph node, sentinel, biopsy, right axillary #1 - THERE IS NO EVIDENCE OF CARCINOMA IN 1 OF 1 LYMPH NODE (0/1). - SEE COMMENT 4. Lymph node, sentinel, biopsy, right axillary #2 - THERE IS NO EVIDENCE OF CARCINOMA IN 1 OF 1 LYMPH NODE (0/1). - SEE COMMENT. 5. Lymph node, sentinel, biopsy, right axillary #3 - THERE IS NO EVIDENCE OF CARCINOMA IN 1 OF 1 LYMPH NODE (0/1). - SEE COMMENT.  Microscopic Comment 1. Carcinoma is not identified in the current specimen. The surgical resection margin(s) of the specimen were inked and microscopically evaluated. 2. Carcinoma is not identified in the current specimen. The surgical resection margin(s) of the specimen were inked and microscopically evaluated. 3. -5. There are no  significant treatment related changes present in the histologically evaluated sections of the lymph nodes. (JBK:kh 01-19-15)   RADIOGRAPHIC STUDIES: I have personally reviewed the radiological images as listed and agreed with the findings in the report.  Breast MRI 12/16/2014 Right breast: The previously seen enhancing irregular right breast mass located within the upper inner quadrant has decreased in size and there is markedly decreased enhancement associated with the mass post chemotherapy. On the non subtracted T1 images the mass now measures 2.2 x 1.9 x 1.1 cm in size and previously the mass measured 3.6 x 3.3 x 2.8 cm in size. There are no new enhancing lesions.  Left breast: The previously seen enhancement associated with the biopsy proven left breast DCIS has resolved. There are no new areas of enhancement.  Lymph nodes: No abnormal appearing lymph nodes.  Ancillary findings: None.  PET 01/01/2015  IMPRESSION: Decrease in size and enhancement of the mass (invasive mammary carcinoma) located within the upper inner quadrant of the right breast as discussed above. Also, resolution of previously seen enhancement located within the left breast at the 1 o'clock position (DCIS). No evidence for adenopathy and no additional findings.   IMPRESSION: 1. Significant interval partial treatment response in the right breast mass. 2. No residual hypermetabolic metastatic disease. Previously described right axillary, left neck, bilateral hilar and right periportal hypermetabolic adenopathy has resolved. 3. Atherosclerosis, including left main and two-vessel coronary artery disease.   ASSESSMENT & PLAN:  69 year old postmenopausal woman, with past medical history of seizure and arthritis, presented with a palpable mass in the inner upper quadrant of her right breast. Biopsy revealed poorly differentiated carcinoma, triple negative.  1. 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 -The PET scan showed hypermetabolic lymph nodes in the left supraclavicular, left cervical level II, right hilar and a periportal area. Although  they are suspicious for distant note metastasis, both left [place] and right hilar node biopsy were negaative, so likely reactive adenopathy. -we have discussed her case in our breast tumor board, the consensus is proceed with neoadjuvant chemotherapy, followed by surgery and radiation. -I recommended to start chemotherapy with dose dense Adriamycin and Cytoxan X4, followed by by biweekly TaxolX4, to maximally control her breast cancer. She agrees -She has completed 4 cycles of dose dense AC, however her breast mass has only shunk by 20-30%. Given the suboptimal response to [facility], I recommend the next chemotherapy to be weekly carbo and Taxol, giving her triple negative disease.  -She has been tolerating chemotherapy well overall, is finishing the last cycle carbotaxol today. -I discussed her repeated breast MRI, which showed partial response of the primary breast tumor, no adenopathy. No other new lesions. -I discussed her restaging PET scan, which showed resolution of her previously hypermetabolic adenopathy. Partial response in her breast mass. No other new lesions. -I discussed her surgical pathology results. She has had complete pathological response, and 3 lymph nodes were negative. I think her prognosis will be very good, although she still has small risk of cancer versus recurrence. -The left breast DCIS has no residual tumor on the surgical sample, it was ER/PR strongly positive, we discussed chemoprevention with tamoxifen or anastrozole after she completes radiation. She is interested. -She will start breast irradiation soon at [facility] under Dr. Unknown Jim care.   2.  Neutropenia and anemia -Secondary to chemotherapy. -continue close monitoring, we'll repeat her lab on next visit  3. Arthritis -Worsening with the chemotherapy, improved now  -She will continue use tramadol as needed  4. Genetic -She has multiple family members had breast cancer -her genetic testing was negative for the following 20 genes: ATM, BARD1, BRCA1, BRCA2, BRIP1, CDH1, CHEK2, EPCAM, FANCC, MLH1, MSH2, MSH6, NBN, PALB2, PMS2, PTEN, RAD51C, RAD51D, TP53, and XRCC2  Plan: -she will start breast irradiation soon -She will call me when she finishes breast radiation, and I'll see her back in a few weeks with lab.   All questions were answered. The patient knows to call the clinic with any problems, questions or concerns. I spent 20 minutes counseling the patient face to face. The total time spent in the appointment was 30 minutes and more than 50% was on counseling.   We'll continue  Truitt Merle, MD 01/29/2015 9:19 AM

## 2015-01-29 NOTE — Progress Notes (Signed)
Department of Radiation Oncology  Phone:  [phone] Fax:        [phone]   Name: Kristin Pennington MRN: [id number]  DOB: 06/29/1945  Date: 01/29/2015  Follow Up Visit Note  Diagnosis: Breast cancer of upper-inner quadrant of right female breast [facility])   Staging form: Breast, AJCC 7th Edition     Clinical stage from 06/18/2014: Stage IIA (T2, N0, M0) - Signed by Truitt Merle, MD on 01/29/2015       Staging comments: Staged at breast conference on 3.23.16      Pathologic stage from 01/15/2015: yT0, N0, cM0 - Signed by Truitt Merle, MD on 01/29/2015  Diagnosis: T2N0 Invasive Ductal Carcinoma  Summary and Interval since last radiation: No previous radiation  Interval History: Kristin Pennington presents today for her routine follow-up appointment with radiation oncology. Since I last saw her, she has undergone biospy of the left supraclavicular lymph node and left hilum, both which were negative for malignancy. She then completed chemotherapy with AC followed by weekly carbotaxol. She completed this in September of 2016. She also had a biospy of her left breast due to an MRI abnormality, which showed low grade DCIS. This lesion measured 3.8 x 2 x 1.1cm on MRI. Follow up MRI in September of 2016 showed a good response to treatment. The mass in the right breast measured 2.2 x 1.1 x 1.9cm and the enhancement in the left breast had completely disappeared. There were no abnormal appearing lymph nodes. A restaging PET scan in October of 2016 showed no hypermetabolic adenopathy. The left neck node had shrunk to 3mm as had the left supraclavicular lymph node. She underwent her bilateral lumpectomies on 01/15/2015. She also had a sentinel lymph node biopsy on the right. All specimens showed no evidence of disease. Treatment changes were noted in the left breast and the right breast specimens. She tolerated her chemotherapy well. She has no ill effects from chemotherapy or surgery and presents today for my option. Her right  breast was reported triple negative, but her DCIS was ER/PR positive. Dr. Burr Medico, MD has recommended Tamoxifen or an A1 after radiation.  Biopsies completed thus far reports no cancer. The patient projected a healthy mental status and was not accompanied by family today. She vocalized that she feels this is in God's hands. She has lost a sister recently to cancer and has a personal history of sezuires. "I did very well from the chemo. I had the surgery Thursday and Saturday morning, my arm was up here," the patient stated, lifting her arm without struggle fully above her head. She vocalized that she lives in [place], [place]. She lives closer to the [place], [place] location.   Physical Exam: The patient is alert and oriented. There is no significant changes to the status of overall health to be noted at this time. Filed Vitals:   01/29/15 1038  BP: 116/62  Pulse: 71  Temp: 97.5 F (36.4 C)  TempSrc: Oral  Resp: 12  Weight: 168 lb 8 oz (76.431 kg)  SpO2: 99%    IMPRESSION: Meta is a 69 y.o. female presenting to clinic in regards to her T2N0 Invasive Ductal Carcinoma.   PLAN:  I spoke to the patient today.  We know for sure she had cancer in the right breast and I think it is prudent to treat the right breast and comprehensive nodes.  I could also see treating the left breast for DCIS which was been resected.  I don't think it is worthwhile  to go chasing the SCLV and neck nodes. This would significantly increase toxicity and would still leave the hilum untreated.    I will discuss with Dr. Campbell Lerner and my radiation colleagues.  If she requires only right breast treatment, she would be fine to be treated in [place] which is closer for her.  If she needs more complicated treatment, I would suggest she stay here in [place].    She will be contacted via phone about details by Wednesday of next week in regards to her best possible treatment plan. [Home Phone Number/Best Contact Number:  [phone]]  She is to attend her CT simulation planning session next week, as scheduled. She was provided with two tubes of Radiaplex gel today. All vocalized questions and concerns have been addressed. If the patient develops any further questions or concerns in regards to her treatment and recovery, she has been encouraged to contact Dr. Pablo Ledger, MD.  This document serves as a record of services personally performed by Thea Silversmith , MD. It was created on her behalf by Lenn Cal, a trained medical scribe. The creation of this record is based on the scribe's personal observations and the provider's statements to them. This document has been checked and approved by the attending provider.   ------------------------------------------------  Thea Silversmith, MD

## 2015-02-06 ENCOUNTER — Telehealth: Payer: Self-pay | Admitting: *Deleted

## 2015-02-06 DIAGNOSIS — C50912 Malignant neoplasm of unspecified site of left female breast: Secondary | ICD-10-CM | POA: Diagnosis not present

## 2015-02-06 DIAGNOSIS — C50911 Malignant neoplasm of unspecified site of right female breast: Secondary | ICD-10-CM | POA: Diagnosis not present

## 2015-02-06 DIAGNOSIS — Z510 Encounter for antineoplastic radiation therapy: Secondary | ICD-10-CM | POA: Diagnosis not present

## 2015-02-06 DIAGNOSIS — C50211 Malignant neoplasm of upper-inner quadrant of right female breast: Secondary | ICD-10-CM | POA: Diagnosis not present

## 2015-02-06 NOTE — Telephone Encounter (Signed)
Left vm for pt to return call to assess needs prior/during xrt.

## 2015-02-13 DIAGNOSIS — Z510 Encounter for antineoplastic radiation therapy: Secondary | ICD-10-CM | POA: Diagnosis not present

## 2015-02-13 DIAGNOSIS — C50912 Malignant neoplasm of unspecified site of left female breast: Secondary | ICD-10-CM | POA: Diagnosis not present

## 2015-02-13 DIAGNOSIS — C50211 Malignant neoplasm of upper-inner quadrant of right female breast: Secondary | ICD-10-CM | POA: Diagnosis not present

## 2015-02-13 DIAGNOSIS — C50212 Malignant neoplasm of upper-inner quadrant of left female breast: Secondary | ICD-10-CM | POA: Diagnosis not present

## 2015-02-13 DIAGNOSIS — C50911 Malignant neoplasm of unspecified site of right female breast: Secondary | ICD-10-CM | POA: Diagnosis not present

## 2015-02-16 DIAGNOSIS — Z510 Encounter for antineoplastic radiation therapy: Secondary | ICD-10-CM | POA: Diagnosis not present

## 2015-02-16 DIAGNOSIS — C50211 Malignant neoplasm of upper-inner quadrant of right female breast: Secondary | ICD-10-CM | POA: Diagnosis not present

## 2015-02-16 DIAGNOSIS — C50911 Malignant neoplasm of unspecified site of right female breast: Secondary | ICD-10-CM | POA: Diagnosis not present

## 2015-02-16 DIAGNOSIS — C50912 Malignant neoplasm of unspecified site of left female breast: Secondary | ICD-10-CM | POA: Diagnosis not present

## 2015-02-18 DIAGNOSIS — Z510 Encounter for antineoplastic radiation therapy: Secondary | ICD-10-CM | POA: Diagnosis not present

## 2015-02-18 DIAGNOSIS — C50912 Malignant neoplasm of unspecified site of left female breast: Secondary | ICD-10-CM | POA: Diagnosis not present

## 2015-02-18 DIAGNOSIS — C50212 Malignant neoplasm of upper-inner quadrant of left female breast: Secondary | ICD-10-CM | POA: Diagnosis not present

## 2015-02-18 DIAGNOSIS — C50911 Malignant neoplasm of unspecified site of right female breast: Secondary | ICD-10-CM | POA: Diagnosis not present

## 2015-02-24 DIAGNOSIS — Z510 Encounter for antineoplastic radiation therapy: Secondary | ICD-10-CM | POA: Diagnosis not present

## 2015-02-24 DIAGNOSIS — C50912 Malignant neoplasm of unspecified site of left female breast: Secondary | ICD-10-CM | POA: Diagnosis not present

## 2015-02-24 DIAGNOSIS — C50911 Malignant neoplasm of unspecified site of right female breast: Secondary | ICD-10-CM | POA: Diagnosis not present

## 2015-02-24 DIAGNOSIS — C50211 Malignant neoplasm of upper-inner quadrant of right female breast: Secondary | ICD-10-CM | POA: Diagnosis not present

## 2015-02-25 DIAGNOSIS — C50911 Malignant neoplasm of unspecified site of right female breast: Secondary | ICD-10-CM | POA: Diagnosis not present

## 2015-02-25 DIAGNOSIS — C50912 Malignant neoplasm of unspecified site of left female breast: Secondary | ICD-10-CM | POA: Diagnosis not present

## 2015-02-25 DIAGNOSIS — Z510 Encounter for antineoplastic radiation therapy: Secondary | ICD-10-CM | POA: Diagnosis not present

## 2015-02-26 DIAGNOSIS — C50911 Malignant neoplasm of unspecified site of right female breast: Secondary | ICD-10-CM | POA: Diagnosis not present

## 2015-02-26 DIAGNOSIS — Z510 Encounter for antineoplastic radiation therapy: Secondary | ICD-10-CM | POA: Diagnosis not present

## 2015-02-26 DIAGNOSIS — C50912 Malignant neoplasm of unspecified site of left female breast: Secondary | ICD-10-CM | POA: Diagnosis not present

## 2015-02-27 DIAGNOSIS — C50912 Malignant neoplasm of unspecified site of left female breast: Secondary | ICD-10-CM | POA: Diagnosis not present

## 2015-02-27 DIAGNOSIS — Z510 Encounter for antineoplastic radiation therapy: Secondary | ICD-10-CM | POA: Diagnosis not present

## 2015-02-27 DIAGNOSIS — C50911 Malignant neoplasm of unspecified site of right female breast: Secondary | ICD-10-CM | POA: Diagnosis not present

## 2015-03-02 DIAGNOSIS — Z510 Encounter for antineoplastic radiation therapy: Secondary | ICD-10-CM | POA: Diagnosis not present

## 2015-03-02 DIAGNOSIS — C50912 Malignant neoplasm of unspecified site of left female breast: Secondary | ICD-10-CM | POA: Diagnosis not present

## 2015-03-02 DIAGNOSIS — C50911 Malignant neoplasm of unspecified site of right female breast: Secondary | ICD-10-CM | POA: Diagnosis not present

## 2015-03-03 ENCOUNTER — Encounter: Payer: Self-pay | Admitting: *Deleted

## 2015-03-03 DIAGNOSIS — C50911 Malignant neoplasm of unspecified site of right female breast: Secondary | ICD-10-CM | POA: Diagnosis not present

## 2015-03-03 DIAGNOSIS — C50912 Malignant neoplasm of unspecified site of left female breast: Secondary | ICD-10-CM | POA: Diagnosis not present

## 2015-03-03 DIAGNOSIS — C50211 Malignant neoplasm of upper-inner quadrant of right female breast: Secondary | ICD-10-CM | POA: Diagnosis not present

## 2015-03-03 DIAGNOSIS — Z510 Encounter for antineoplastic radiation therapy: Secondary | ICD-10-CM | POA: Diagnosis not present

## 2015-03-03 NOTE — Progress Notes (Signed)
Left vm for pt to return call regarding needs during xrt. 

## 2015-03-04 DIAGNOSIS — C50911 Malignant neoplasm of unspecified site of right female breast: Secondary | ICD-10-CM | POA: Diagnosis not present

## 2015-03-04 DIAGNOSIS — C50912 Malignant neoplasm of unspecified site of left female breast: Secondary | ICD-10-CM | POA: Diagnosis not present

## 2015-03-04 DIAGNOSIS — Z510 Encounter for antineoplastic radiation therapy: Secondary | ICD-10-CM | POA: Diagnosis not present

## 2015-03-05 DIAGNOSIS — C50911 Malignant neoplasm of unspecified site of right female breast: Secondary | ICD-10-CM | POA: Diagnosis not present

## 2015-03-05 DIAGNOSIS — C50912 Malignant neoplasm of unspecified site of left female breast: Secondary | ICD-10-CM | POA: Diagnosis not present

## 2015-03-05 DIAGNOSIS — Z510 Encounter for antineoplastic radiation therapy: Secondary | ICD-10-CM | POA: Diagnosis not present

## 2015-03-06 DIAGNOSIS — C50911 Malignant neoplasm of unspecified site of right female breast: Secondary | ICD-10-CM | POA: Diagnosis not present

## 2015-03-06 DIAGNOSIS — Z510 Encounter for antineoplastic radiation therapy: Secondary | ICD-10-CM | POA: Diagnosis not present

## 2015-03-06 DIAGNOSIS — C50912 Malignant neoplasm of unspecified site of left female breast: Secondary | ICD-10-CM | POA: Diagnosis not present

## 2015-03-09 ENCOUNTER — Telehealth: Payer: Self-pay | Admitting: *Deleted

## 2015-03-09 DIAGNOSIS — C50912 Malignant neoplasm of unspecified site of left female breast: Secondary | ICD-10-CM | POA: Diagnosis not present

## 2015-03-09 DIAGNOSIS — C50911 Malignant neoplasm of unspecified site of right female breast: Secondary | ICD-10-CM | POA: Diagnosis not present

## 2015-03-09 DIAGNOSIS — Z510 Encounter for antineoplastic radiation therapy: Secondary | ICD-10-CM | POA: Diagnosis not present

## 2015-03-09 NOTE — Telephone Encounter (Signed)
Pt returned call. Relate doing well and without complaints. Final xrt 04/13/15. Will refer to survivorship program 6-8 wks afterwards. Encourage pt to call with needs. Received verbal understanding.

## 2015-03-10 DIAGNOSIS — C50211 Malignant neoplasm of upper-inner quadrant of right female breast: Secondary | ICD-10-CM | POA: Diagnosis not present

## 2015-03-10 DIAGNOSIS — C50912 Malignant neoplasm of unspecified site of left female breast: Secondary | ICD-10-CM | POA: Diagnosis not present

## 2015-03-10 DIAGNOSIS — Z510 Encounter for antineoplastic radiation therapy: Secondary | ICD-10-CM | POA: Diagnosis not present

## 2015-03-10 DIAGNOSIS — C50911 Malignant neoplasm of unspecified site of right female breast: Secondary | ICD-10-CM | POA: Diagnosis not present

## 2015-03-11 ENCOUNTER — Ambulatory Visit: Payer: Medicare Other | Admitting: Oncology

## 2015-03-11 ENCOUNTER — Other Ambulatory Visit: Payer: Medicare Other

## 2015-03-11 DIAGNOSIS — C50912 Malignant neoplasm of unspecified site of left female breast: Secondary | ICD-10-CM | POA: Diagnosis not present

## 2015-03-11 DIAGNOSIS — Z510 Encounter for antineoplastic radiation therapy: Secondary | ICD-10-CM | POA: Diagnosis not present

## 2015-03-11 DIAGNOSIS — C50911 Malignant neoplasm of unspecified site of right female breast: Secondary | ICD-10-CM | POA: Diagnosis not present

## 2015-03-12 DIAGNOSIS — C50911 Malignant neoplasm of unspecified site of right female breast: Secondary | ICD-10-CM | POA: Diagnosis not present

## 2015-03-12 DIAGNOSIS — Z510 Encounter for antineoplastic radiation therapy: Secondary | ICD-10-CM | POA: Diagnosis not present

## 2015-03-12 DIAGNOSIS — C50912 Malignant neoplasm of unspecified site of left female breast: Secondary | ICD-10-CM | POA: Diagnosis not present

## 2015-03-13 DIAGNOSIS — C50912 Malignant neoplasm of unspecified site of left female breast: Secondary | ICD-10-CM | POA: Diagnosis not present

## 2015-03-13 DIAGNOSIS — C50911 Malignant neoplasm of unspecified site of right female breast: Secondary | ICD-10-CM | POA: Diagnosis not present

## 2015-03-13 DIAGNOSIS — Z510 Encounter for antineoplastic radiation therapy: Secondary | ICD-10-CM | POA: Diagnosis not present

## 2015-03-16 DIAGNOSIS — C50911 Malignant neoplasm of unspecified site of right female breast: Secondary | ICD-10-CM | POA: Diagnosis not present

## 2015-03-16 DIAGNOSIS — Z510 Encounter for antineoplastic radiation therapy: Secondary | ICD-10-CM | POA: Diagnosis not present

## 2015-03-16 DIAGNOSIS — C50912 Malignant neoplasm of unspecified site of left female breast: Secondary | ICD-10-CM | POA: Diagnosis not present

## 2015-03-17 DIAGNOSIS — C50912 Malignant neoplasm of unspecified site of left female breast: Secondary | ICD-10-CM | POA: Diagnosis not present

## 2015-03-17 DIAGNOSIS — C50211 Malignant neoplasm of upper-inner quadrant of right female breast: Secondary | ICD-10-CM | POA: Diagnosis not present

## 2015-03-17 DIAGNOSIS — Z510 Encounter for antineoplastic radiation therapy: Secondary | ICD-10-CM | POA: Diagnosis not present

## 2015-03-17 DIAGNOSIS — C50911 Malignant neoplasm of unspecified site of right female breast: Secondary | ICD-10-CM | POA: Diagnosis not present

## 2015-03-18 DIAGNOSIS — Z510 Encounter for antineoplastic radiation therapy: Secondary | ICD-10-CM | POA: Diagnosis not present

## 2015-03-18 DIAGNOSIS — C50911 Malignant neoplasm of unspecified site of right female breast: Secondary | ICD-10-CM | POA: Diagnosis not present

## 2015-03-18 DIAGNOSIS — C50912 Malignant neoplasm of unspecified site of left female breast: Secondary | ICD-10-CM | POA: Diagnosis not present

## 2015-03-19 DIAGNOSIS — C50912 Malignant neoplasm of unspecified site of left female breast: Secondary | ICD-10-CM | POA: Diagnosis not present

## 2015-03-19 DIAGNOSIS — C50911 Malignant neoplasm of unspecified site of right female breast: Secondary | ICD-10-CM | POA: Diagnosis not present

## 2015-03-19 DIAGNOSIS — Z510 Encounter for antineoplastic radiation therapy: Secondary | ICD-10-CM | POA: Diagnosis not present

## 2015-03-20 DIAGNOSIS — C50911 Malignant neoplasm of unspecified site of right female breast: Secondary | ICD-10-CM | POA: Diagnosis not present

## 2015-03-20 DIAGNOSIS — C50912 Malignant neoplasm of unspecified site of left female breast: Secondary | ICD-10-CM | POA: Diagnosis not present

## 2015-03-20 DIAGNOSIS — Z510 Encounter for antineoplastic radiation therapy: Secondary | ICD-10-CM | POA: Diagnosis not present

## 2015-03-24 DIAGNOSIS — Z510 Encounter for antineoplastic radiation therapy: Secondary | ICD-10-CM | POA: Diagnosis not present

## 2015-03-24 DIAGNOSIS — C50912 Malignant neoplasm of unspecified site of left female breast: Secondary | ICD-10-CM | POA: Diagnosis not present

## 2015-03-24 DIAGNOSIS — C50911 Malignant neoplasm of unspecified site of right female breast: Secondary | ICD-10-CM | POA: Diagnosis not present

## 2015-03-25 DIAGNOSIS — C50912 Malignant neoplasm of unspecified site of left female breast: Secondary | ICD-10-CM | POA: Diagnosis not present

## 2015-03-25 DIAGNOSIS — Z510 Encounter for antineoplastic radiation therapy: Secondary | ICD-10-CM | POA: Diagnosis not present

## 2015-03-25 DIAGNOSIS — C50211 Malignant neoplasm of upper-inner quadrant of right female breast: Secondary | ICD-10-CM | POA: Diagnosis not present

## 2015-03-25 DIAGNOSIS — C50911 Malignant neoplasm of unspecified site of right female breast: Secondary | ICD-10-CM | POA: Diagnosis not present

## 2015-03-26 ENCOUNTER — Other Ambulatory Visit: Payer: Self-pay | Admitting: Nurse Practitioner

## 2015-03-26 DIAGNOSIS — Z510 Encounter for antineoplastic radiation therapy: Secondary | ICD-10-CM | POA: Diagnosis not present

## 2015-03-26 DIAGNOSIS — C50912 Malignant neoplasm of unspecified site of left female breast: Secondary | ICD-10-CM | POA: Diagnosis not present

## 2015-03-26 DIAGNOSIS — C50911 Malignant neoplasm of unspecified site of right female breast: Secondary | ICD-10-CM | POA: Diagnosis not present

## 2015-03-27 DIAGNOSIS — C50911 Malignant neoplasm of unspecified site of right female breast: Secondary | ICD-10-CM | POA: Diagnosis not present

## 2015-03-27 DIAGNOSIS — Z510 Encounter for antineoplastic radiation therapy: Secondary | ICD-10-CM | POA: Diagnosis not present

## 2015-03-27 DIAGNOSIS — C50211 Malignant neoplasm of upper-inner quadrant of right female breast: Secondary | ICD-10-CM | POA: Diagnosis not present

## 2015-03-27 DIAGNOSIS — C50912 Malignant neoplasm of unspecified site of left female breast: Secondary | ICD-10-CM | POA: Diagnosis not present

## 2015-03-31 DIAGNOSIS — Z510 Encounter for antineoplastic radiation therapy: Secondary | ICD-10-CM | POA: Diagnosis not present

## 2015-03-31 DIAGNOSIS — C50911 Malignant neoplasm of unspecified site of right female breast: Secondary | ICD-10-CM | POA: Diagnosis not present

## 2015-03-31 DIAGNOSIS — C50912 Malignant neoplasm of unspecified site of left female breast: Secondary | ICD-10-CM | POA: Diagnosis not present

## 2015-04-01 DIAGNOSIS — C50912 Malignant neoplasm of unspecified site of left female breast: Secondary | ICD-10-CM | POA: Diagnosis not present

## 2015-04-01 DIAGNOSIS — Z510 Encounter for antineoplastic radiation therapy: Secondary | ICD-10-CM | POA: Diagnosis not present

## 2015-04-01 DIAGNOSIS — C50911 Malignant neoplasm of unspecified site of right female breast: Secondary | ICD-10-CM | POA: Diagnosis not present

## 2015-04-02 DIAGNOSIS — C50211 Malignant neoplasm of upper-inner quadrant of right female breast: Secondary | ICD-10-CM | POA: Diagnosis not present

## 2015-04-02 DIAGNOSIS — Z510 Encounter for antineoplastic radiation therapy: Secondary | ICD-10-CM | POA: Diagnosis not present

## 2015-04-02 DIAGNOSIS — C50911 Malignant neoplasm of unspecified site of right female breast: Secondary | ICD-10-CM | POA: Diagnosis not present

## 2015-04-02 DIAGNOSIS — C50912 Malignant neoplasm of unspecified site of left female breast: Secondary | ICD-10-CM | POA: Diagnosis not present

## 2015-04-03 DIAGNOSIS — Z510 Encounter for antineoplastic radiation therapy: Secondary | ICD-10-CM | POA: Diagnosis not present

## 2015-04-03 DIAGNOSIS — C50912 Malignant neoplasm of unspecified site of left female breast: Secondary | ICD-10-CM | POA: Diagnosis not present

## 2015-04-03 DIAGNOSIS — C50911 Malignant neoplasm of unspecified site of right female breast: Secondary | ICD-10-CM | POA: Diagnosis not present

## 2015-04-07 DIAGNOSIS — C50911 Malignant neoplasm of unspecified site of right female breast: Secondary | ICD-10-CM | POA: Diagnosis not present

## 2015-04-07 DIAGNOSIS — Z510 Encounter for antineoplastic radiation therapy: Secondary | ICD-10-CM | POA: Diagnosis not present

## 2015-04-07 DIAGNOSIS — C50912 Malignant neoplasm of unspecified site of left female breast: Secondary | ICD-10-CM | POA: Diagnosis not present

## 2015-04-08 ENCOUNTER — Telehealth: Payer: Self-pay | Admitting: Hematology

## 2015-04-08 DIAGNOSIS — Z510 Encounter for antineoplastic radiation therapy: Secondary | ICD-10-CM | POA: Diagnosis not present

## 2015-04-08 DIAGNOSIS — C50912 Malignant neoplasm of unspecified site of left female breast: Secondary | ICD-10-CM | POA: Diagnosis not present

## 2015-04-08 DIAGNOSIS — C50911 Malignant neoplasm of unspecified site of right female breast: Secondary | ICD-10-CM | POA: Diagnosis not present

## 2015-04-08 NOTE — Telephone Encounter (Signed)
Spoke with patient re needing an appointment with YF - per patient she completed xrt 1/17 and was informed by Dr. Remonia Richter she needs to get back in with Dr. Burr Medico before 1/31. Message to desk nurse. Message also routed. Patient aware.

## 2015-04-09 ENCOUNTER — Other Ambulatory Visit: Payer: Self-pay | Admitting: Hematology

## 2015-04-09 ENCOUNTER — Encounter: Payer: Self-pay | Admitting: *Deleted

## 2015-04-09 DIAGNOSIS — C50911 Malignant neoplasm of unspecified site of right female breast: Secondary | ICD-10-CM | POA: Diagnosis not present

## 2015-04-09 DIAGNOSIS — Z510 Encounter for antineoplastic radiation therapy: Secondary | ICD-10-CM | POA: Diagnosis not present

## 2015-04-09 DIAGNOSIS — C50912 Malignant neoplasm of unspecified site of left female breast: Secondary | ICD-10-CM | POA: Diagnosis not present

## 2015-04-10 DIAGNOSIS — C50211 Malignant neoplasm of upper-inner quadrant of right female breast: Secondary | ICD-10-CM | POA: Diagnosis not present

## 2015-04-10 DIAGNOSIS — C50911 Malignant neoplasm of unspecified site of right female breast: Secondary | ICD-10-CM | POA: Diagnosis not present

## 2015-04-10 DIAGNOSIS — C50912 Malignant neoplasm of unspecified site of left female breast: Secondary | ICD-10-CM | POA: Diagnosis not present

## 2015-04-10 DIAGNOSIS — Z510 Encounter for antineoplastic radiation therapy: Secondary | ICD-10-CM | POA: Diagnosis not present

## 2015-04-13 DIAGNOSIS — C50911 Malignant neoplasm of unspecified site of right female breast: Secondary | ICD-10-CM | POA: Diagnosis not present

## 2015-04-13 DIAGNOSIS — C50912 Malignant neoplasm of unspecified site of left female breast: Secondary | ICD-10-CM | POA: Diagnosis not present

## 2015-04-13 DIAGNOSIS — Z510 Encounter for antineoplastic radiation therapy: Secondary | ICD-10-CM | POA: Diagnosis not present

## 2015-04-14 DIAGNOSIS — C50912 Malignant neoplasm of unspecified site of left female breast: Secondary | ICD-10-CM | POA: Diagnosis not present

## 2015-04-14 DIAGNOSIS — Z510 Encounter for antineoplastic radiation therapy: Secondary | ICD-10-CM | POA: Diagnosis not present

## 2015-04-14 DIAGNOSIS — C50911 Malignant neoplasm of unspecified site of right female breast: Secondary | ICD-10-CM | POA: Diagnosis not present

## 2015-04-16 ENCOUNTER — Other Ambulatory Visit: Payer: Self-pay | Admitting: *Deleted

## 2015-04-16 ENCOUNTER — Telehealth: Payer: Self-pay | Admitting: Hematology

## 2015-04-16 DIAGNOSIS — C50211 Malignant neoplasm of upper-inner quadrant of right female breast: Secondary | ICD-10-CM

## 2015-04-16 NOTE — Telephone Encounter (Signed)
Survivorship made and patient will get this at 1/30 with dr Burr Medico

## 2015-04-27 ENCOUNTER — Telehealth: Payer: Self-pay | Admitting: Hematology

## 2015-04-27 ENCOUNTER — Ambulatory Visit (HOSPITAL_BASED_OUTPATIENT_CLINIC_OR_DEPARTMENT_OTHER): Payer: Medicare Other | Admitting: Hematology

## 2015-04-27 ENCOUNTER — Encounter: Payer: Self-pay | Admitting: Hematology

## 2015-04-27 VITALS — BP 119/71 | HR 79 | Temp 97.6°F | Resp 17 | Ht 64.0 in | Wt 169.4 lb

## 2015-04-27 DIAGNOSIS — D0512 Intraductal carcinoma in situ of left breast: Secondary | ICD-10-CM

## 2015-04-27 DIAGNOSIS — C50211 Malignant neoplasm of upper-inner quadrant of right female breast: Secondary | ICD-10-CM | POA: Diagnosis not present

## 2015-04-27 NOTE — Progress Notes (Signed)
[facility]  [phone] [phone]  Clinic Follow Up Note   Patient Care Team: Lonzo Cloud, MD as PCP - General (General Practice) Lonzo Cloud, MD (General Practice) Fanny Skates, MD as Consulting Physician (General Surgery) Truitt Merle, MD as Consulting Physician (Hematology) Thea Silversmith, MD as Consulting Physician (Radiation Oncology) Rockwell Germany, RN as Registered Nurse Mauro Kaufmann, RN as Registered Nurse Holley Bouche, NP as Nurse Practitioner (Nurse Practitioner) 04/27/2015  CHIEF COMPLAINTS:  Follow-up triple negative breast cancer and DCIS   Oncology History   Breast cancer of upper-inner quadrant of right female breast   Staging form: Breast, AJCC 7th Edition     Clinical stage from 06/18/2014: Stage IIA (T2, N0, M0) - Unsigned       Staging comments: Staged at breast conference on 3.23.16        Breast cancer of upper-inner quadrant of right female breast ([place])   06/05/2014 Breast US ultrasound is performed, showing a 2.4 x 1.5 x 2.3 cm heterogeneous mass at the right breast 1 o'clock 15 cm from nipple palpable area correlating to the mammographic finding. Ultrasound of the right axilla is negative   06/05/2014 Pathology Results INVASIVE MAMMARY CARCINOMA   06/05/2014 Receptors her2 Estrogen Receptor: 0%, NEGATIVE Progesterone Receptor: 0%, NEGATIVE Proliferation Marker Ki67: 98%  HER-2/NEU BY CISH - NEGATIVE   06/11/2014 Initial Diagnosis Breast cancer of upper-inner quadrant of right female breast   07/02/2014 PET scan 1. Intensely hypermetabolic mass in the medial RIGHT chest wall with hypermetabolic right axillary node 2. Hypermetabolic nodal metastasis in the LEFT level 2 and LEFT supraclavicular and periportal node (SUV 5).    07/04/2014 Pathology Results Left breast mass biopsy showed DCIS, ER100%, PR 48%   07/10/2014 Pathology Results left [place] node biopsy was negative for malignant cells, benign reactive lymph tissue    07/18/2014 -  Chemotherapy Dose dense Adriamycin and Cytoxan, every 2 weeks, X4, followed by weekly carbo and Taxol for 12 weeks.   07/28/2014 Pathology Results Right hilar and are lymph node biopsy showed no malignant cells, mixed lymphoid population.   08/11/2014 Miscellaneous nadir CBC: WBC 0.54, hemoglobin 8.9, hematocrit 26%, platelet 125   01/15/2015 Surgery Bilateral lumpectomy and right sentinel lymph node biopsy.   01/15/2015 Pathology Results Bilateral breast lumpectomy showed fibrocystic change, treatment-related changes, no residual malignancy. 3 right axillary sentinel lymph nodes were negative.    HISTORY OF PRESENTING ILLNESS:  Kristin Pennington 70 y.o. female is here because of recently diagnosed poorly differentiated carcinoma in her right breast.  She found a lump in her right breast 3 weeks ago, mild tenderness,  No other complains. Last mammo was in oct 2015 which was negative. She feels well overall. She denies any pain, cough, shortness breast, GI symptoms. She has good energy level and appetite, no recent weight loss.  Her last colonoscopy was for 5 years ago which was negative per patient. Her last Pap smear was 3 years ago which was negative per patient.  CURRERNT THERAPY:  observation  INTERIM HISTORY: Mrs. Auvil returns for follow-up. She  Is accompanied by her niece to the clinic today. She has completed bilateral breast radiation on generally 17 2017. She tolerated radiation pretty well, with some mild skin itchiness, no pain, or other side effects. She denies any other pain,  Dyspnea or other symptoms.  MEDICAL HISTORY:  Past Medical History  Diagnosis Date  . Wears glasses   . Wears partial dentures     top  .  Arthritis     knee  . Breast cancer ([place])     bilateral, right breast mass, cancer both breast, x1 chemo  a week ago.  . Pneumonia     2015  . GERD (gastroesophageal reflux disease)     with chemo  . 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  Glaucoma     "early" - eye doctor is watching     SURGICAL HISTORY: Past Surgical History  Procedure Laterality Date  . Appendectomy    . Cholecystectomy    . Abdominal hysterectomy    . Tonsillectomy    . Colonoscopy    . Portacath placement N/A 06/30/2014    Procedure: INSERTION PORT-A-CATH WITH ULTRA SOUND ON STANDBY;  Surgeon: Fanny Skates, MD;  Location: [facility];  Service: General;  Laterality: N/A;  . Endobronchial ultrasound Bilateral 07/28/2014    Procedure: ENDOBRONCHIAL ULTRASOUND;  Surgeon: Collene Gobble, MD;  Location: [facility];  Service: Cardiopulmonary;  Laterality: Bilateral;  . Carpal tunnel release Left   . Breast lumpectomy with radioactive seed localization Left 01/15/2015    Procedure: LEFT BREAST LUMPECTOMY WITH RADIOACTIVE SEED LOCALIZATION;  Surgeon: Fanny Skates, MD;  Location: [facility];  Service: General;  Laterality: Left;  . Breast lumpectomy with radioactive seed and sentinel lymph node biopsy Right 01/15/2015    Procedure: RIGHT BREAST LUMPECTOMY WITH RADIOACTIVE SEED AND SENTINEL LYMPH NODE BIOPSY;  Surgeon: Fanny Skates, MD;  Location: [facility];  Service: General;  Laterality: Right;  . Port-a-cath removal Left 01/15/2015    Procedure: REMOVAL PORT-A-CATH;  Surgeon: Fanny Skates, MD;  Location: [facility];  Service: General;  Laterality: Left;   SOCIAL HISTORY: Social History   Social History  . Marital Status: Married    Spouse Name: N/A  . Number of Children: N/A  . Years of Education: N/A   Occupational History  . Not on file.   Social History Main Topics  . Smoking status: Former Smoker -- 1.00 packs/day for 18 years    Types: Cigarettes    Quit date: 11/08/1988  . Smokeless tobacco: Never Used  . Alcohol Use: No  . Drug Use: No  . Sexual Activity: Not on file   Other Topics Concern  . Not on file   Social History Narrative     GYN HISTORY  Menarchal: 11 LMP: 1994, hysrectomy  Contraceptive: no  HRT: since 34   G2P1, one miscarriage     FAMILY HISTORY: Family History  Problem Relation Age of Onset  . Stomach cancer Father 70  . Lung cancer Sister 66    not sure if primary or breast cancer met  . Breast cancer Maternal Aunt 72  . Breast cancer Cousin 67  . Heart Problems Paternal Aunt   . Congestive Heart Failure Mother     ALLERGIES:  is allergic to aspirin; codeine; diclofenac; meloxicam; nitrofurantoin; other; pyridium; sulfa antibiotics; tetracyclines & related; and tape.  MEDICATIONS:  Current Outpatient Prescriptions  Medication Sig Dispense Refill  . aspirin EC 81 MG tablet Take 81 mg by mouth daily.     . Calcium Carb-Cholecalciferol (CALCIUM 600+D3) 600-400 MG-UNIT TABS Take 1 tablet by mouth 2 (two) times daily.    . cyanocobalamin (,VITAMIN B-12,) 1000 MCG/ML injection Inject 1,000 mcg into the muscle every 30 (thirty) days.     Marland Kitchen HYDROcodone-acetaminophen (NORCO) 5-325 MG tablet Take 1-2 tablets by mouth every 6 (six) hours as needed for moderate pain or severe  pain. 30 tablet 0  . levETIRAcetam (KEPPRA) 500 MG tablet Take 1 tablet by mouth 2 (two) times daily.    . ondansetron (ZOFRAN) 8 MG tablet Take 8 mg by mouth every 8 (eight) hours as needed for nausea or vomiting.    Marland Kitchen oxaprozin (DAYPRO) 600 MG tablet Take 1 tablet by mouth 2 (two) times daily.    . phenytoin (DILANTIN) 100 MG ER capsule Take 1 tablet by mouth 4 (four) times daily. On  Sat , Sun, Tues, Wed, and  Thursdays    -  Take   178m  QID.    .Marland Kitchenprochlorperazine (COMPAZINE) 10 MG tablet Take 1 tablet by mouth every 8 (eight) hours as needed for nausea or vomiting.   0  . Tetrahydrozoline HCl ([employer] EYE DROPS OP) Apply 1 drop to eye 3 (three) times daily.    . traMADol (ULTRAM) 50 MG tablet Take 50 mg by mouth every 6 (six) hours as needed.     . sodium chloride (OCEAN) 0.65 % SOLN nasal spray Place 1 spray into both nostrils as needed for congestion.     No current facility-administered medications for this visit.      REVIEW OF SYSTEMS:   Constitutional: Denies fevers, chills or abnormal night sweats Eyes: Denies blurriness of vision, double vision or watery eyes Ears, nose, mouth, throat, and face: Denies mucositis or sore throat Respiratory: Denies cough, dyspnea or wheezes Cardiovascular: Denies palpitation, chest discomfort or lower extremity swelling Gastrointestinal:  Denies nausea, heartburn or change in bowel habits Skin: Denies abnormal skin rashes Lymphatics: Denies new lymphadenopathy or easy bruising Neurological:Denies numbness, tingling or new weaknesses Behavioral/Psych: Mood is stable, no new changes  All other systems were reviewed with the patient and are negative.  PHYSICAL EXAMINATION: ECOG PERFORMANCE STATUS: 1  BP 119/71 mmHg  Pulse 79  Temp(Src) 97.6 F (36.4 C) (Oral)  Resp 17  Ht 5' 4" (1.626 m)  Wt 169 lb 6.4 oz (76.839 kg)  BMI 29.06 kg/m2  SpO2 98%  GENERAL:alert, no distress and comfortable SKIN: skin color, texture, turgor are normal, no rashes or significant lesions EYES: normal, conjunctiva are pink and non-injected, sclera clear OROPHARYNX:no exudate, no erythema and lips, buccal mucosa, and tongue normal  NECK: supple, thyroid normal size, non-tender, without nodularity LYMPH:  no palpable lymphadenopathy in the cervical, axillary or inguinal LUNGS: clear to auscultation and percussion with normal breathing effort HEART: regular rate & rhythm and no murmurs and no lower extremity edema ABDOMEN:abdomen soft, non-tender and normal bowel sounds Musculoskeletal:no cyanosis of digits and no clubbing, no significant right arm swelling PSYCH: alert & oriented x 3 with fluent speech NEURO: no focal motor/sensory deficits Breasts: Breast inspection showed them to be symmetrical with no nipple discharge.  (+) Surgical incision site in both breasts are healing well, (+)  Diffuse bilateral skin erythema secondary to radiation, no skin ulcers or blisters. Some  fullness underneath the  Surgical incision in the right breast. No other palpable mass or adenopathy.  LABORATORY DATA:  I have reviewed the data as listed CBC Latest Ref Rng 01/08/2015 12/19/2014 12/12/2014  WBC 3.9 - 10.3 10e3/uL 3.4(L) 3.6(L) 3.1(L)  Hemoglobin 11.6 - 15.9 g/dL 11.2(L) 10.1(L) 10.8(L)  Hematocrit 34.8 - 46.6 % 32.6(L) 29.7(L) 30.7(L)  Platelets 145 - 400 10e3/uL 195 147 178    CMP Latest Ref Rng 01/08/2015 12/19/2014 12/12/2014  Glucose 70 - 140 mg/dl 86 103 93  BUN 7.0 - 26.0 mg/dL 15.4 19.5 13.0  Creatinine 0.6 -  1.1 mg/dL 0.8 0.7 0.7  Sodium 136 - 145 mEq/L 141 139 141  Potassium 3.5 - 5.1 mEq/L 4.0 3.6 4.3  Chloride 96 - 112 mmol/L - - -  CO2 22 - 29 mEq/L _0 Calcium 8.4 - 10.4 mg/dL 9.0 8.7 9.2  Total Protein 6.4 - 8.3 g/dL 6.8 6.9 6.9  Total Bilirubin 0.20 - 1.20 mg/dL <0.30 <0.30 <0.20  Alkaline Phos 40 - 150 U/L 109 99 114  AST 5 - 34 U/L _1 ALT 0 - 55 U/L _2 PATHOLOGY REPORT 06/05/2014 Diagnosis 01/15/2015 1. Breast, lumpectomy, Left - FIBROCYSTIC CHANGE WITH ADENOSIS AND CALCIFICATIONS. - TREATMENT RELATED CHANGES. - THERE IS NO EVIDENCE OF MALIGNANCY. - SEE COMMENT. 2. Breast, lumpectomy, Right - FIBROCYSTIC CHANGES WITH ADENOSIS. - TREATMENT RELATED CHANGES. - THERE IS NO EVIDENCE OF MALIGNANCY. - SEE COMMENT. 3. Lymph node, sentinel, biopsy, right axillary #1 - THERE IS NO EVIDENCE OF CARCINOMA IN 1 OF 1 LYMPH NODE (0/1). - SEE COMMENT 4. Lymph node, sentinel, biopsy, right axillary #2 - THERE IS NO EVIDENCE OF CARCINOMA IN 1 OF 1 LYMPH NODE (0/1). - SEE COMMENT. 5. Lymph node, sentinel, biopsy, right axillary #3 - THERE IS NO EVIDENCE OF CARCINOMA IN 1 OF 1 LYMPH NODE (0/1). - SEE COMMENT.  Microscopic Comment 1. Carcinoma is not identified in the current specimen. The surgical resection margin(s) of the specimen were inked and microscopically evaluated. 2. Carcinoma is not identified in the current specimen.  The surgical resection margin(s) of the specimen were inked and microscopically evaluated. 3. -5. There are no significant treatment related changes present in the histologically evaluated sections of the lymph nodes. (JBK:kh 01-19-15)  Diagnosis  07/04/2014 Breast, left, needle core biopsy, UOQ - DUCTAL CARCINOMA IN-SITU. - SEE COMMENT.  Microscopic Comment The ductal carcinoma in situ is low grade. The carcinoma cells are positive for e-cadherin and cytokeratin AE1/AE3. Smooth muscle myosin, calponin, and p63 stains highlight the presence of myoepithelial cells. Estrogen receptor and progesterone receptor studies will be performed and the results reported separately. The results were called to [facility] on 07-08-2014. IMMUNOHISTOCHEMICAL AND MORPHOMETRIC ANALYSIS BY THE AUTOMATED CELLULAR IMAGING SYSTEM (ACIS) Estrogen Receptor: 100%, POSITIVE, STRONG STAINING INTENSITY Progesterone Receptor: 28%, POSITIVE, WEAK STAINING INTENSITY   RADIOGRAPHIC STUDIES: I have personally reviewed the radiological images as listed and agreed with the findings in the report.  Breast MRI 12/16/2014 Right breast: The previously seen enhancing irregular right breast mass located within the upper inner quadrant has decreased in size and there is markedly decreased enhancement associated with the mass post chemotherapy. On the non subtracted T1 images the mass now measures 2.2 x 1.9 x 1.1 cm in size and previously the mass measured 3.6 x 3.3 x 2.8 cm in size. There are no new enhancing lesions.  Left breast: The previously seen enhancement associated with the biopsy proven left breast DCIS has resolved. There are no new areas of enhancement.  Lymph nodes: No abnormal appearing lymph nodes.  Ancillary findings: None.  PET 01/01/2015  IMPRESSION: Decrease in size and enhancement of the mass (invasive mammary carcinoma) located within the upper inner quadrant of the  right breast as discussed above. Also, resolution of previously seen enhancement located within the left breast at the 1 o'clock position (DCIS). No evidence for adenopathy and no additional findings.   IMPRESSION: 1. Significant interval partial treatment response in the right breast mass. 2.  No residual hypermetabolic metastatic disease. Previously described right axillary, left neck, bilateral hilar and right periportal hypermetabolic adenopathy has resolved. 3. Atherosclerosis, including left main and two-vessel coronary artery disease.   ASSESSMENT & PLAN:  70 year old postmenopausal woman, with past medical history of seizure and arthritis, presented with a palpable mass in the inner upper quadrant of her right breast. Biopsy revealed poorly differentiated carcinoma, triple negative.  1. Poorly differentiated mammary carcinoma in her right breast, cT2NxM0, triple negative, ypT0N0 - she now has completed new adjuvant chemotherapy , with complete pathological response - she also completed adjuvant breast irradiation, and tolerated well, - we discussed the surveillance plan,  Which includes annual screening mammogram, self-exam and follow-up visit every 3-4 months for the first 2 years, and every 6 months afterwards for additional 3 years. - she is due for annual screening mammogram in March. - her exam was unremarkable today.  2. Left breast DCIS,  ER+/PR+ -The left breast DCIS has no residual tumor on the surgical sample, it was ER/PR strongly positive, we discussed chemoprevention with tamoxifen or anastrozole after she completes radiation.  Given her advanced age,  Low-grade DCIS, tiny tumor, and she has received breast radiation, my recommendation is moderate. - I given her written material about tamoxifen and anastrozole, she will think about it.   3. Arthritis -Worsening with the chemotherapy, improved now  -She will continue use tramadol as needed  4. Genetic -She has  multiple family members had breast cancer -her genetic testing was negative for the following 20 genes: ATM, BARD1, BRCA1, BRCA2, BRIP1, CDH1, CHEK2, EPCAM, FANCC, MLH1, MSH2, MSH6, NBN, PALB2, PMS2, PTEN, RAD51C, RAD51D, TP53, and XRCC2  Plan: -  Diagnostic bilateral mammogram at [facility] in March - I'll see her back in 3 months for follow-up with lab.  All questions were answered. The patient knows to call the clinic with any problems, questions or concerns.  I spent 20 minutes counseling the patient face to face. The total time spent in the appointment was 30 minutes and more than 50% was on counseling.   We'll continue  Truitt Merle, MD 04/27/2015 9:04 AM

## 2015-04-27 NOTE — Telephone Encounter (Signed)
Gave patient avs report and appointments for April and May. Patient will schedule annual mammo per 1/30 pof - per patient mammo to be done in [place] - no order for mammo available at this time.

## 2015-04-27 NOTE — Telephone Encounter (Signed)
Message to YF to confirm when patient should be seeing her and having labs. Per pof 3 mos - standing order mthly - per patient she is to return 3 mos only for lab and f/u. Patient aware if anything changes re her appointments I will call her.

## 2015-04-30 ENCOUNTER — Telehealth: Payer: Self-pay | Admitting: *Deleted

## 2015-04-30 DIAGNOSIS — Z9079 Acquired absence of other genital organ(s): Secondary | ICD-10-CM | POA: Diagnosis not present

## 2015-04-30 DIAGNOSIS — C50412 Malignant neoplasm of upper-outer quadrant of left female breast: Secondary | ICD-10-CM | POA: Diagnosis not present

## 2015-04-30 DIAGNOSIS — Z90710 Acquired absence of both cervix and uterus: Secondary | ICD-10-CM | POA: Diagnosis not present

## 2015-04-30 DIAGNOSIS — Z90722 Acquired absence of ovaries, bilateral: Secondary | ICD-10-CM | POA: Diagnosis not present

## 2015-04-30 DIAGNOSIS — Z9049 Acquired absence of other specified parts of digestive tract: Secondary | ICD-10-CM | POA: Diagnosis not present

## 2015-04-30 DIAGNOSIS — C50211 Malignant neoplasm of upper-inner quadrant of right female breast: Secondary | ICD-10-CM | POA: Diagnosis not present

## 2015-04-30 DIAGNOSIS — Z8669 Personal history of other diseases of the nervous system and sense organs: Secondary | ICD-10-CM | POA: Diagnosis not present

## 2015-04-30 NOTE — Telephone Encounter (Signed)
Per staff message from [place] patient only needs lab/fu 47mos - patient not attached to staff message but within body of the message. Response received from YF 1/30.

## 2015-04-30 NOTE — Telephone Encounter (Signed)
Pt called to let Dr Burr Medico know she would like to start Tamoxifen. Please send to [facility]

## 2015-05-01 ENCOUNTER — Other Ambulatory Visit: Payer: Self-pay | Admitting: General Surgery

## 2015-05-01 DIAGNOSIS — Z853 Personal history of malignant neoplasm of breast: Secondary | ICD-10-CM

## 2015-05-01 DIAGNOSIS — C50412 Malignant neoplasm of upper-outer quadrant of left female breast: Secondary | ICD-10-CM

## 2015-05-05 ENCOUNTER — Other Ambulatory Visit: Payer: Self-pay | Admitting: Hematology

## 2015-05-05 MED ORDER — TAMOXIFEN CITRATE 20 MG PO TABS: 20.0000 mg | ORAL_TABLET | Freq: Every day | ORAL | Status: DC

## 2015-05-05 NOTE — Telephone Encounter (Signed)
TC to patient and made her aware that her prescription for Tamoxifen is being called in today.  She voiced understanding

## 2015-05-05 NOTE — Telephone Encounter (Signed)
Pt calling in again about starting Tamoxifen. She needs prescription called in to  [facility] @ [phone]

## 2015-05-05 NOTE — Telephone Encounter (Signed)
Please let pt know that I will send the prescription to her pharmacy now.   Thanks,  Truitt Merle

## 2015-05-15 DIAGNOSIS — C50912 Malignant neoplasm of unspecified site of left female breast: Secondary | ICD-10-CM | POA: Diagnosis not present

## 2015-05-15 DIAGNOSIS — Z923 Personal history of irradiation: Secondary | ICD-10-CM | POA: Diagnosis not present

## 2015-05-15 DIAGNOSIS — C50911 Malignant neoplasm of unspecified site of right female breast: Secondary | ICD-10-CM | POA: Diagnosis not present

## 2015-05-26 ENCOUNTER — Encounter (HOSPITAL_COMMUNITY): Payer: Self-pay

## 2015-06-01 DIAGNOSIS — M25562 Pain in left knee: Secondary | ICD-10-CM | POA: Diagnosis not present

## 2015-06-01 DIAGNOSIS — G8929 Other chronic pain: Secondary | ICD-10-CM | POA: Diagnosis not present

## 2015-06-01 DIAGNOSIS — M1712 Unilateral primary osteoarthritis, left knee: Secondary | ICD-10-CM | POA: Diagnosis not present

## 2015-06-01 DIAGNOSIS — M1711 Unilateral primary osteoarthritis, right knee: Secondary | ICD-10-CM | POA: Diagnosis not present

## 2015-06-04 DIAGNOSIS — H40013 Open angle with borderline findings, low risk, bilateral: Secondary | ICD-10-CM | POA: Diagnosis not present

## 2015-06-12 ENCOUNTER — Ambulatory Visit
Admission: RE | Admit: 2015-06-12 | Discharge: 2015-06-12 | Disposition: A | Discharge status: Home / Self Care | Payer: Medicare Other | Source: Ambulatory Visit | Attending: General Surgery | Admitting: General Surgery

## 2015-06-12 DIAGNOSIS — C50412 Malignant neoplasm of upper-outer quadrant of left female breast: Secondary | ICD-10-CM

## 2015-06-12 DIAGNOSIS — Z853 Personal history of malignant neoplasm of breast: Secondary | ICD-10-CM

## 2015-06-12 DIAGNOSIS — R922 Inconclusive mammogram: Secondary | ICD-10-CM | POA: Diagnosis not present

## 2015-06-29 ENCOUNTER — Telehealth: Payer: Self-pay | Admitting: *Deleted

## 2015-06-29 DIAGNOSIS — N61 Mastitis without abscess: Secondary | ICD-10-CM | POA: Diagnosis not present

## 2015-06-29 NOTE — Telephone Encounter (Signed)
Pt reports bump on R nipple came up over the weekend.  She reports noting throbbing pain from back to this spot before noticing the spot.  On Saturday spot was a little bigger & was white with a dark center & by the eve it was even larger @ size of pencil eraser-maybe slightly larger.  She noted another spot on the L outer breast & one under R arm.  There was some redness around R breast & slightly warm to touch & unable to wear bra or anything b/c of sensitivity.  She reports going to GYN office today & was placed on ATB-cephalexin 500 mg x 7 day but asked to inform Dr Burr Medico.  She states that she has a survivors class  on thurs at 10 am if she needs to be seen.  She reports taking benadryl at hs just to help prevent her from scratching these places.  Informed Dr Burr Medico & encouraged pt to call if worse & Whitney Muse NP will see her Thurs.  She was also informed of symptoms.  Pt knows to call if anything changes.

## 2015-06-30 ENCOUNTER — Telehealth: Payer: Self-pay | Admitting: *Deleted

## 2015-06-30 NOTE — Telephone Encounter (Signed)
Kristin Pennington,   Please let pt know that I have checked myself and with our pharmacist and found no interaction between tamoxifen and meloxicam or Naprosyn. She can use either of them. Encouraged her to drink more water when she takes meloxicam or Naprosyn.  Truitt Merle  06/30/2015

## 2015-06-30 NOTE — Telephone Encounter (Signed)
Patient called asking if she may take "Meloxicam or Naprosyn for her knee.  Insurance will no longer cover DayPro.  I remember Dr. Burr Medico telling me not to take Ibuprofen.  I'm currently on tamoxifen.  Just need to make sure any medicines do not affect my cancer."

## 2015-07-01 ENCOUNTER — Other Ambulatory Visit: Payer: Self-pay | Admitting: Hematology

## 2015-07-01 NOTE — Telephone Encounter (Signed)
Called patient receiving voicemail.  This information of no drug interaction left on voicemail for patient.

## 2015-07-02 ENCOUNTER — Telehealth: Payer: Self-pay | Admitting: Nurse Practitioner

## 2015-07-02 ENCOUNTER — Ambulatory Visit (HOSPITAL_BASED_OUTPATIENT_CLINIC_OR_DEPARTMENT_OTHER): Payer: Medicare Other | Admitting: Nurse Practitioner

## 2015-07-02 ENCOUNTER — Encounter: Payer: Self-pay | Admitting: Nurse Practitioner

## 2015-07-02 VITALS — BP 117/67 | HR 71 | Temp 98.0°F | Resp 18 | Ht 64.0 in | Wt 167.4 lb

## 2015-07-02 DIAGNOSIS — Z853 Personal history of malignant neoplasm of breast: Secondary | ICD-10-CM | POA: Diagnosis not present

## 2015-07-02 DIAGNOSIS — N61 Mastitis without abscess: Secondary | ICD-10-CM | POA: Diagnosis not present

## 2015-07-02 DIAGNOSIS — D0512 Intraductal carcinoma in situ of left breast: Secondary | ICD-10-CM

## 2015-07-02 DIAGNOSIS — C50211 Malignant neoplasm of upper-inner quadrant of right female breast: Secondary | ICD-10-CM

## 2015-07-02 MED ORDER — TAMOXIFEN CITRATE 20 MG PO TABS: 20.0000 mg | ORAL_TABLET | Freq: Every day | ORAL | Status: DC

## 2015-07-02 NOTE — Telephone Encounter (Signed)
Called and spoke with pharmacist to place a 3 day extended prescription on hold for Kristin Pennington in the event the cellulitis was not cleared by the completion of her 7 day course of therapy.  Kristin Pennington knows to contact the pharmacy in the event she needs this filled.

## 2015-07-02 NOTE — Progress Notes (Signed)
CLINIC:  Cancer Survivorship   REASON FOR VISIT:  Routine follow-up post-treatment for a recent history of breast cancer.  BRIEF ONCOLOGIC HISTORY:  Oncology History   Breast cancer of upper-inner quadrant of right female breast   Staging form: Breast, AJCC 7th Edition     Clinical stage from 06/18/2014: Stage IIA (T2, N0, M0) - Unsigned       Staging comments: Staged at breast conference on 3.23.16        Breast cancer of upper-inner quadrant of right female breast (Kristin)   06/05/2014 Breast US Ultrasound is performed, showing a 2.4 x 1.5 x 2.3 cm heterogeneous mass at the right breast 1 o'clock 15 cm from nipple palpable area correlating to the mammographic finding. Ultrasound of the right axilla is negative   06/05/2014 Initial Biopsy Right breast core needle bx: invasive mammary carcinoma, ER- (0%), PR- (0%), HER2/neu negative, Ki67: 98%   07/02/2014 PET scan 1. Intensely hypermetabolic mass in the medial RIGHT chest wall with hypermetabolic right axillary node 2. Hypermetabolic nodal metastasis in the LEFT level 2 and LEFT supraclavicular and periportal node (SUV 5).    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   07/28/2014 Pathology Results Right hilar and are lymph node biopsy showed no malignant cells, mixed lymphoid population.   08/11/2014 Miscellaneous nadir CBC: WBC 0.54, hemoglobin 8.9, hematocrit 26%, platelet 125   01/12/2015 Procedure Breast/Ovarian ([facility]) panel: no clinically sig variant at ATM, BARD1, BRCA1, BRCA2, BRIP1, CDH1, CHEK2, EPCAM, FANCC, MLH1, MSH2, MSH6, NBN, PALB2, PMS2, PTEN, RAD51C, RAD51D, TP53, and XRCC2   01/15/2015 Surgery Bilateral lumpectomy and right sentinel lymph node biopsy.   01/15/2015 Pathology Results Bilateral breast lumpectomy showed fibrocystic change, treatment-related changes, no residual malignancy. 3 right axillary sentinel lymph nodes were negative.   02/24/2015 - 04/14/2015 Radiation Therapy Right breast 45 Gy over 25 fractions; right supraclav fossa/axilla: 45 Gy over 25 fractions; right breast boost: 16 Gy over 8 fractions.  Total dose RIGHT: 61 Gy  Left breast: 50.4 Gy over 28 fractions.   04/2015 -  Anti-estrogen oral therapy Tamoxifen 20 mg daily    INTERVAL HISTORY:  Kristin Pennington presents to the [facility] today for our initial meeting to review her survivorship care plan detailing her treatment course for breast cancer, as well as monitoring long-term side effects of that treatment, education regarding health maintenance, screening, and overall wellness and health promotion.     Overall, Kristin Pennington reports feeling pretty well since completing her radiation therapy approximately 2 1/2 months ago. The skin changes overlying her bilateral breasts had improved until recently.  Three days ago, she presented to her gynecologist with blisters along both breasts with accompanying erythema.  She was told that it was cellulitis and she was placed on cephalexin for 7 days.  She reports that this is improving, with her right breast improving more quickly than her left breast, and that the heat and redness has almost resolved completely.  She denies any accompanying fever or chills.  She continues with fatigue.  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  She has begun her tamoxifen and is experiencing hot flashes, but states that these are bearable at this time.  They do awaken her at night.  She has had no chest pain, dyspnea, calf pain or swelling.  She  denies any headache, cough,or bone pain.  She has a  good appetite and denies any weight loss.    REVIEW OF SYSTEMS:  General: Fatigue and hot flashes. Denies fever, chills, or unintentional weight loss. HEENT: Wears glasses. Denies visual changes, hearing loss, mouth sores or difficulty swallowing. Cardiac: Denies palpitations, chest pain, and lower extremity edema.  Respiratory: Denies wheeze or dyspnea on exertion.  Breast: Denies any new nodularity, masses, tenderness, nipple changes, or nipple discharge.  GI: Denies abdominal pain, constipation, diarrhea, nausea, or vomiting.  GU: Denies dysuria, hematuria, vaginal bleeding, vaginal discharge, or vaginal dryness.  Musculoskeletal: Denies joint or bone pain.  Neuro: Denies recent fall or numbness / tingling in her extremities. Skin: Denies rash, pruritis, or open wounds.  Psych: Denies depression, anxiety, insomnia, or memory loss.   A 14-point review of systems was completed and was negative, except as noted above.   ONCOLOGY TREATMENT TEAM:  1. Surgeon:  Dr. Dalbert Batman at [facility]  2. Medical Oncologist: Dr. Burr Medico 3. Radiation Oncologist: Dr. Pablo Ledger    PAST MEDICAL/SURGICAL HISTORY:  Past Medical History  Diagnosis Date  . Wears glasses   . Wears partial dentures     top  . Arthritis     knee  . Breast cancer ([place])     bilateral, right breast mass, cancer both breast, x1 chemo  a week ago.  . Pneumonia     2015  . GERD (gastroesophageal reflux disease)     with chemo  . 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 Glaucoma     "early" - eye doctor is watching   Past Surgical History  Procedure Laterality Date  . Appendectomy    . Cholecystectomy    . Abdominal hysterectomy    . Tonsillectomy    . Colonoscopy    . Portacath placement N/A 06/30/2014    Procedure: INSERTION PORT-A-CATH WITH ULTRA SOUND ON STANDBY;  Surgeon: Fanny Skates, MD;  Location: [place];  Service: General;  Laterality: N/A;  . Endobronchial ultrasound  Bilateral 07/28/2014    Procedure: ENDOBRONCHIAL ULTRASOUND;  Surgeon: Collene Gobble, MD;  Location: [facility];  Service: Cardiopulmonary;  Laterality: Bilateral;  . Carpal tunnel release Left   . Breast lumpectomy with radioactive seed localization Left 01/15/2015    Procedure: LEFT BREAST LUMPECTOMY WITH RADIOACTIVE SEED LOCALIZATION;  Surgeon: Fanny Skates, MD;  Location: [place];  Service: General;  Laterality: Left;  . Breast lumpectomy with radioactive seed and sentinel lymph node biopsy Right 01/15/2015    Procedure: RIGHT BREAST LUMPECTOMY WITH RADIOACTIVE SEED AND SENTINEL LYMPH NODE BIOPSY;  Surgeon: Fanny Skates, MD;  Location: [place];  Service: General;  Laterality: Right;  . Port-a-cath removal Left 01/15/2015    Procedure: REMOVAL PORT-A-CATH;  Surgeon: Fanny Skates, MD;  Location: [place];  Service: General;  Laterality: Left;     ALLERGIES:  Allergies  Allergen Reactions  . Aspirin Other (See Comments)    Stomach cramps  . Codeine Nausea And Vomiting  . Diclofenac Hives  . Meloxicam Hives  . Nitrofurantoin Nausea And Vomiting  . Other Other (See Comments)    Darvocet-N  -   Stomach cramps  .  Pyridium [Phenazopyridine Hcl] Nausea And Vomiting  . Sulfa Antibiotics Hives  . Tetracyclines & Related Anaphylaxis  . Tape      CURRENT MEDICATIONS:  Current Outpatient Prescriptions on File Prior to Visit  Medication Sig Dispense Refill  . aspirin EC 81 MG tablet Take 81 mg by mouth daily.     . Calcium Carb-Cholecalciferol (CALCIUM 600+D3) 600-400 MG-UNIT TABS Take 1 tablet by mouth 2 (two) times daily.    . cyanocobalamin (,VITAMIN B-12,) 1000 MCG/ML injection Inject 1,000 mcg into the muscle every 30 (thirty) days.     Marland Kitchen HYDROcodone-acetaminophen (NORCO) 5-325 MG tablet Take 1-2 tablets by mouth every 6 (six) hours as needed for moderate pain or severe pain. 30 tablet 0  . levETIRAcetam (KEPPRA) 500 MG tablet Take 1 tablet by mouth 2 (two) times daily.    .  ondansetron (ZOFRAN) 8 MG tablet Take 8 mg by mouth every 8 (eight) hours as needed for nausea or vomiting.    Marland Kitchen oxaprozin (DAYPRO) 600 MG tablet Take 1 tablet by mouth 2 (two) times daily.    . phenytoin (DILANTIN) 100 MG ER capsule Take 1 tablet by mouth 4 (four) times daily. On  Sat , Sun, Tues, Wed, and  Thursdays    -  Take   121m  QID.    .Marland Kitchenprochlorperazine (COMPAZINE) 10 MG tablet Take 1 tablet by mouth every 8 (eight) hours as needed for nausea or vomiting.   0  . sodium chloride (OCEAN) 0.65 % SOLN nasal spray Place 1 spray into both nostrils as needed for congestion.    . Tetrahydrozoline HCl ([facility] EYE DROPS OP) Apply 1 drop to eye 3 (three) times daily.    . traMADol (ULTRAM) 50 MG tablet Take 50 mg by mouth every 6 (six) hours as needed.      No current facility-administered medications on file prior to visit.     ONCOLOGIC FAMILY HISTORY:  Family History  Problem Relation Age of Onset  . Stomach cancer Father 622 . Lung cancer Sister 625   not sure if primary or breast cancer met  . Breast cancer Maternal Aunt 72  . Breast cancer Cousin 653 . Heart Problems Paternal Aunt   . Congestive Heart Failure Mother      GENETIC COUNSELING/TESTING: Yes, performed 01/12/2015: Breast/Ovarian (Gene[facility]x) panel: no clinically significant variant at ATM, BARD1, BRCA1, BRCA2, BRIP1, CDH1, CHEK2, EPCAM, FANCC, MLH1, MSH2, MSH6, NBN, PALB2, PMS2, PTEN, RAD51C, RAD51D, TP53, and XRCC2   SOCIAL HISTORY:  RNezzie Manerais married [place]  [place] 1 child. Ms. MRabadanis currently retired.  She is a former smoker and denies any current or history of alcohol or illicit drug use.     PHYSICAL EXAMINATION:  Vital Signs: Filed Vitals:   07/02/15 0957  BP: 117/67  Pulse: 71  Temp: 98 F (36.7 C)  Resp: 18   ECOG Performance Status: 0  General: Well-nourished, well-appearing female in no acute distress.  She is accompanied in clinic by her daughter today.    HEENT: Head is atraumatic and normocephalic.  Pupils equal and reactive to light and accomodation. Conjunctivae clear without exudate.  Sclerae anicteric. Oral mucosa is pink, moist, and intact without lesions.  Oropharynx is pink without lesions or erythema.  Lymph: No cervical, supraclavicular, infraclavicular, or axillary lymphadenopathy noted on palpation.  Cardiovascular: Regular rate and rhythm without murmurs, rubs, or gallops. Respiratory: Clear to auscultation bilaterally. Chest expansion  symmetric without accessory muscle use on inspiration or expiration.  Breast: Bilateral breast exam performed.  Resolving erythema over right breast inferior quadrant; warm but not hot to touch.  Resolving blistered area along central portion / nipple.  Small pustular area along lateral left breast, non-tender, non-reddened.  No mass or lesion throughout either breast bilaterally. GI: Abdomen soft and round. No tenderness to palpation. Bowel sounds normoactive in 4 quadrants. No hepatosplenomegaly.   GU: Deferred.  Musculoskeletal: Muscle strength 5/5 in all extremities.   Neuro: No focal deficits. Steady gait.  Psych: Mood and affect normal and appropriate for situation.  Extremities: No edema, cyanosis, or clubbing.  Skin: Warm and dry. No open lesions noted.   LABORATORY DATA:  None for this visit.  DIAGNOSTIC IMAGING:  None for this visit.     ASSESSMENT AND PLAN:   1. 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  Kristin Pennington is doing  well without clinical symptoms worrisome for disease recurrence. She will follow-up with her medical oncologist,  Dr. Burr Medico, in May 2017 with history and physical examination per surveillance protocol.  She will continue her anti-estrogen therapy with tamoxifen at this time. She was instructed to make [place] aware if she begins to experience any new or increased side effects of the medication and I could see her back in clinic to help manage those side effects, as needed. Side effects of Tamoxifen were again reviewed with her as well. A comprehensive survivorship care plan and treatment summary was reviewed with the patient today detailing her breast cancer diagnosis, treatment course, potential late/long-term effects of treatment, appropriate follow-up care with recommendations for the future, and patient education resources.  A copy of this summary, along with a letter will be sent to the patient's primary care provider via in basket message after today's visit.  Kristin Pennington is welcome to return to the Survivorship Clinic in the future, as needed; no follow-up will be scheduled at this time.    2. Cellulitis of the breast: Kristin Pennington presented to her gynecologist's office on Monday 06/29/2015 with the above mentioned breast changes and was placed on antibiotics.  The infection appears to be clearing in both breasts.  I have advised her to continue her antibiotics at this time to complete her 7 days course.  I have also instructed her that in the event the infection does not continue to improve, that I will call in another 3 days of medication to extend the course to a total of 10 days.  [facility] [place], [place] (please see phone note).  3. Cancer screening:  Due to Kristin Pennington's history and her age, she should receive screening for skin cancers and colon cancer (and is due for colonoscopy this year).  She is S/P hysterectomy. The information and recommendations are listed on the  patient's comprehensive care plan/treatment summary and were reviewed in detail with the patient.    4. Health maintenance and wellness promotion: Kristin Pennington was encouraged to consume 5-7 servings of fruits and vegetables per day. We reviewed the "Nutrition Rainbow" handout, as well  as discussed recommendations to maximize nutrition and minimize recurrence, such as increased intake of fruits, vegetables, lean proteins, and minimizing the intake of red meats and processed foods.  She was also encouraged to engage in moderate to vigorous exercise for 30 minutes per day most days of the week. We discussed the [employer] fitness program, which is designed for cancer survivors to help them become more physically fit after cancer treatments.  She was instructed to limit her alcohol consumption and continue to abstain from tobacco use.  A copy of the "Take Control of Your Health" brochure was given to her reinforcing these recommendations.   5. Support services/counseling: It is not uncommon for this period of the patient's cancer care trajectory to be one of many emotions and stressors.  We discussed an opportunity for her to participate in the next session of [facility] ("Finding Your New Normal") support group series designed for patients after they have completed treatment.  Kristin Pennington was encouraged to take advantage of our many other support services programs, support groups, and/or counseling in coping with her new life as a cancer survivor after completing anti-cancer treatment.  She was offered support today through active listening and expressive supportive counseling.  She was given information regarding our available services and encouraged to contact me with any questions or for help enrolling in any of our support group/programs.    A total of 60 minutes of face-to-face time was spent with this patient with greater than 50% of that time in counseling and care-coordination.   Sylvan Cheese, NP  Survivorship Program [facility] [phone]   Note: PRIMARY CARE PROVIDER [place], [place] [phone]

## 2015-07-13 DIAGNOSIS — M25572 Pain in left ankle and joints of left foot: Secondary | ICD-10-CM | POA: Diagnosis not present

## 2015-07-13 DIAGNOSIS — M659 Synovitis and tenosynovitis, unspecified: Secondary | ICD-10-CM | POA: Diagnosis not present

## 2015-07-13 DIAGNOSIS — M25562 Pain in left knee: Secondary | ICD-10-CM | POA: Diagnosis not present

## 2015-07-13 DIAGNOSIS — M1712 Unilateral primary osteoarthritis, left knee: Secondary | ICD-10-CM | POA: Diagnosis not present

## 2015-07-27 ENCOUNTER — Other Ambulatory Visit: Payer: Self-pay | Admitting: *Deleted

## 2015-07-27 ENCOUNTER — Telehealth: Payer: Self-pay | Admitting: Hematology

## 2015-07-27 ENCOUNTER — Other Ambulatory Visit: Payer: Self-pay | Admitting: Hematology

## 2015-07-27 ENCOUNTER — Encounter: Payer: Self-pay | Admitting: Hematology

## 2015-07-27 ENCOUNTER — Ambulatory Visit (HOSPITAL_BASED_OUTPATIENT_CLINIC_OR_DEPARTMENT_OTHER): Payer: Medicare Other | Admitting: Hematology

## 2015-07-27 ENCOUNTER — Other Ambulatory Visit (HOSPITAL_BASED_OUTPATIENT_CLINIC_OR_DEPARTMENT_OTHER): Payer: Medicare Other

## 2015-07-27 VITALS — BP 113/65 | HR 72 | Temp 97.6°F | Resp 17 | Ht 64.0 in | Wt 167.4 lb

## 2015-07-27 DIAGNOSIS — C50211 Malignant neoplasm of upper-inner quadrant of right female breast: Secondary | ICD-10-CM | POA: Diagnosis not present

## 2015-07-27 DIAGNOSIS — R232 Flushing: Secondary | ICD-10-CM

## 2015-07-27 DIAGNOSIS — D0512 Intraductal carcinoma in situ of left breast: Secondary | ICD-10-CM | POA: Diagnosis not present

## 2015-07-27 DIAGNOSIS — Z79810 Long term (current) use of selective estrogen receptor modulators (SERMs): Secondary | ICD-10-CM

## 2015-07-27 LAB — CBC WITH DIFFERENTIAL/PLATELET
BASO%: 0.8 % (ref 0.0–2.0)
Basophils Absolute: 0 10*3/uL (ref 0.0–0.1)
EOS%: 5.2 % (ref 0.0–7.0)
Eosinophils Absolute: 0.2 10*3/uL (ref 0.0–0.5)
HCT: 37.5 % (ref 34.8–46.6)
HGB: 12.4 g/dL (ref 11.6–15.9)
LYMPH%: 13.8 % — ABNORMAL LOW (ref 14.0–49.7)
MCH: 31.7 pg (ref 25.1–34.0)
MCHC: 33.1 g/dL (ref 31.5–36.0)
MCV: 95.6 fL (ref 79.5–101.0)
MONO#: 0.7 10*3/uL (ref 0.1–0.9)
MONO%: 15.3 % — ABNORMAL HIGH (ref 0.0–14.0)
NEUT#: 2.9 10*3/uL (ref 1.5–6.5)
NEUT%: 64.9 % (ref 38.4–76.8)
Platelets: 178 10*3/uL (ref 145–400)
RBC: 3.92 10*6/uL (ref 3.70–5.45)
RDW: 13.1 % (ref 11.2–14.5)
WBC: 4.4 10*3/uL (ref 3.9–10.3)
lymph#: 0.6 10*3/uL — ABNORMAL LOW (ref 0.9–3.3)

## 2015-07-27 LAB — COMPREHENSIVE METABOLIC PANEL
ALT: 12 U/L (ref 0–55)
AST: 15 U/L (ref 5–34)
Albumin: 3.2 g/dL — ABNORMAL LOW (ref 3.5–5.0)
Alkaline Phosphatase: 80 U/L (ref 40–150)
Anion Gap: 9 mEq/L (ref 3–11)
BUN: 18.5 mg/dL (ref 7.0–26.0)
CO2: 27 mEq/L (ref 22–29)
Calcium: 9.1 mg/dL (ref 8.4–10.4)
Chloride: 104 mEq/L (ref 98–109)
Creatinine: 0.7 mg/dL (ref 0.6–1.1)
EGFR: 82 mL/min/{1.73_m2} — ABNORMAL LOW (ref >90)
Glucose: 84 mg/dl (ref 70–140)
Potassium: 4 mEq/L (ref 3.5–5.1)
Sodium: 139 mEq/L (ref 136–145)
Total Bilirubin: <0.3 mg/dL (ref 0.20–1.20)
Total Protein: 6.9 g/dL (ref 6.4–8.3)

## 2015-07-27 MED ORDER — VENLAFAXINE HCL 37.5 MG PO TABS: 37.5000 mg | ORAL_TABLET | Freq: Two times a day (BID) | ORAL | Status: DC

## 2015-07-27 NOTE — Telephone Encounter (Signed)
Gave pt appt for sept & avs

## 2015-07-27 NOTE — Progress Notes (Addendum)
[place]  [phone] [phone]  Clinic Follow Up Note   Patient Care Team: Lonzo Cloud, MD as PCP - General (General Practice) Lonzo Cloud, MD (General Practice) Fanny Skates, MD as Consulting Physician (General Surgery) Truitt Merle, MD as Consulting Physician (Hematology) Thea Silversmith, MD as Consulting Physician (Radiation Oncology) Rockwell Germany, RN as Registered Nurse Mauro Kaufmann, RN as Registered Nurse Holley Bouche, NP as Nurse Practitioner (Nurse Practitioner) Sylvan Cheese, NP as Nurse Practitioner (Hematology and Oncology) 07/27/2015  CHIEF COMPLAINTS:  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 Ultrasound of the right axilla is negative   06/05/2014 Initial Biopsy Right breast core needle bx: invasive mammary carcinoma, ER- (0%), PR- (0%), HER2/neu negative, Ki67: 98%   07/02/2014 PET scan 1. Intensely hypermetabolic mass in the medial RIGHT chest wall with hypermetabolic right axillary node 2. Hypermetabolic nodal metastasis in the LEFT level 2 and LEFT supraclavicular and periportal node (SUV 5).    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   07/28/2014 Pathology Results 10R and 10L lymph node biopsy showed no malignant cells, mixed lymphoid population.   08/11/2014 Miscellaneous nadir CBC: WBC 0.54, hemoglobin 8.9, hematocrit 26%, platelet 125   01/12/2015 Procedure Breast/Ovarian ([facility]) panel: no clinically sig variant at ATM, BARD1, BRCA1, BRCA2, BRIP1, CDH1, CHEK2, EPCAM, FANCC, MLH1, MSH2, MSH6, NBN, PALB2, PMS2, PTEN, RAD51C, RAD51D, TP53, and XRCC2   01/15/2015 Surgery Bilateral lumpectomy and right sentinel lymph node biopsy.   01/15/2015 Pathology Results Bilateral breast lumpectomy showed fibrocystic change, treatment-related changes, no residual malignancy. 3 right axillary sentinel lymph nodes were negative.   02/24/2015 - 04/14/2015 Radiation Therapy Right breast 45 Gy over 25 fractions; right supraclav fossa/axilla: 45 Gy over 25 fractions; right breast boost: 16 Gy over 8 fractions.  Total dose RIGHT: 61 Gy  Left breast: 50.4 Gy over 28 fractions.   04/2015 -  Anti-estrogen oral therapy Tamoxifen 20 mg daily   07/02/2015 Survivorship Survivorship visit completed    HISTORY OF PRESENTING ILLNESS:  Kristin Pennington 70 y.o. female is here because of recently diagnosed poorly differentiated carcinoma in her right breast.  She found a lump in her right breast 3 weeks ago, mild tenderness,  No other complains. Last mammo was in oct 2015 which was negative. She feels well overall. She denies any pain, cough, shortness breast, GI symptoms. She has good energy level and appetite, no recent weight loss.  Her last colonoscopy was for 5 years ago which was negative  per patient. Her last Pap smear was 3 years ago which was negative per patient.  CURRERNT THERAPY:  observation  INTERIM HISTORY: Kristin Pennington returns for follow-up. She  Is accompanied by her niece to the clinic today. She has been on tamoxifen since February  2017. She tolerates well overall, but does complain moderate hot flash, especially at night, she wakes up 3-4 times at night, hot flashes are mild during daytime. She also reports mild fatigue in the afternoon, and takes now. She has some pain in the right tissue, has been seen her podiatrist. She otherwise doing very well, has good appetite, remains to be physically active at home and functions well.  MEDICAL HISTORY:  Past Medical History  Diagnosis Date  . Wears glasses   . Wears partial dentures     top  . Arthritis     knee  . Breast cancer ([place])     bilateral, right breast mass, cancer both breast, x1 chemo  a week ago.  . Pneumonia     2015  . GERD (gastroesophageal reflux disease)     with chemo  . 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 Glaucoma     "early" - eye doctor is watching     SURGICAL HISTORY: Past Surgical History  Procedure Laterality Date  . Appendectomy    . Cholecystectomy    . Abdominal hysterectomy    . Tonsillectomy    . Colonoscopy    . Portacath placement N/A 06/30/2014    Procedure: INSERTION PORT-A-CATH WITH ULTRA SOUND ON STANDBY;  Surgeon: Fanny Skates, MD;  Location: [facility];  Service: General;  Laterality: N/A;  . Endobronchial ultrasound Bilateral 07/28/2014    Procedure: ENDOBRONCHIAL ULTRASOUND;  Surgeon: Collene Gobble, MD;  Location: [facility];  Service: Cardiopulmonary;  Laterality: Bilateral;  . Carpal tunnel release Left   . Breast lumpectomy with radioactive seed localization Left 01/15/2015    Procedure: LEFT BREAST LUMPECTOMY WITH RADIOACTIVE SEED LOCALIZATION;  Surgeon: Fanny Skates, MD;  Location: [facility];  Service: General;  Laterality: Left;  . Breast lumpectomy with radioactive seed and sentinel lymph node biopsy Right 01/15/2015    Procedure: RIGHT BREAST LUMPECTOMY WITH RADIOACTIVE SEED AND SENTINEL LYMPH NODE BIOPSY;  Surgeon: Fanny Skates, MD;  Location: [facility];  Service: General;  Laterality:  Right;  . Port-a-cath removal Left 01/15/2015    Procedure: REMOVAL PORT-A-CATH;  Surgeon: Fanny Skates, MD;  Location: [facility];  Service: General;  Laterality: Left;   SOCIAL HISTORY: Social History   Social History  . Marital Status: Married    Spouse Name: N/A  . Number of Children: N/A  . Years of Education: N/A   Occupational History  . Not on file.   Social History Main Topics  . Smoking status: Former Smoker -- 1.00 packs/day for 18 years    Types: Cigarettes    Quit date: 11/08/1988  . Smokeless tobacco: Never Used  . Alcohol Use: No  . Drug Use: No  . Sexual Activity: Not on file   Other Topics Concern  . Not on file   Social History Narrative     GYN HISTORY  Menarchal: 11 LMP: 1994, hysrectomy  Contraceptive: no  HRT: since 43  G2P1, one miscarriage     FAMILY HISTORY: Family History  Problem Relation Age of Onset  . Stomach cancer Father 40  . Lung cancer Sister 72    not sure if primary or breast  cancer met  . Breast cancer Maternal Aunt 72  . Breast cancer Cousin 15  . Heart Problems Paternal Aunt   . Congestive Heart Failure Mother     ALLERGIES:  is allergic to aspirin; codeine; diclofenac; meloxicam; nitrofurantoin; other; pyridium; sulfa antibiotics; tetracyclines & related; and tape.  MEDICATIONS:  Current Outpatient Prescriptions  Medication Sig Dispense Refill  . aspirin EC 81 MG tablet Take 81 mg by mouth daily.     . Calcium Carb-Cholecalciferol (CALCIUM 600+D3) 600-400 MG-UNIT TABS Take 1 tablet by mouth 2 (two) times daily.    . cyanocobalamin (,VITAMIN B-12,) 1000 MCG/ML injection Inject 1,000 mcg into the muscle every 30 (thirty) days.     Marland Kitchen levETIRAcetam (KEPPRA) 500 MG tablet Take 1 tablet by mouth 2 (two) times daily.    . phenytoin (DILANTIN) 100 MG ER capsule Take 1 tablet by mouth 4 (four) times daily. On  Sat , Sun, Tues, Wed, and  Thursdays    -  Take   143m  QID.    .Marland Kitchensodium chloride (OCEAN) 0.65 % SOLN nasal spray  Place 1 spray into both nostrils as needed for congestion.    . tamoxifen (NOLVADEX) 20 MG tablet Take 1 tablet (20 mg total) by mouth daily. 30 tablet 11  . Tetrahydrozoline HCl ([employer] EYE DROPS OP) Apply 1 drop to eye 3 (three) times daily.    .Marland KitchenHYDROcodone-acetaminophen (NORCO) 5-325 MG tablet Take 1-2 tablets by mouth every 6 (six) hours as needed for moderate pain or severe pain. (Patient not taking: Reported on 07/27/2015) 30 tablet 0  . ondansetron (ZOFRAN) 8 MG tablet Take 8 mg by mouth every 8 (eight) hours as needed for nausea or vomiting. Reported on 07/27/2015    . oxaprozin (DAYPRO) 600 MG tablet Take 1 tablet by mouth 2 (two) times daily. Reported on 07/27/2015    . prochlorperazine (COMPAZINE) 10 MG tablet Take 1 tablet by mouth every 8 (eight) hours as needed for nausea or vomiting. Reported on 07/27/2015  0  . venlafaxine (EFFEXOR) 37.5 MG tablet Take 1 tablet (37.5 mg total) by mouth 2 (two) times daily. 30 tablet 1   No current facility-administered medications for this visit.    REVIEW OF SYSTEMS:   Constitutional: Denies fevers, chills or abnormal night sweats Eyes: Denies blurriness of vision, double vision or watery eyes Ears, nose, mouth, throat, and face: Denies mucositis or sore throat Respiratory: Denies cough, dyspnea or wheezes Cardiovascular: Denies palpitation, chest discomfort or lower extremity swelling Gastrointestinal:  Denies nausea, heartburn or change in bowel habits Skin: Denies abnormal skin rashes Lymphatics: Denies new lymphadenopathy or easy bruising Neurological:Denies numbness, tingling or new weaknesses Behavioral/Psych: Mood is stable, no new changes  All other systems were reviewed with the patient and are negative.  PHYSICAL EXAMINATION: ECOG PERFORMANCE STATUS: 1  BP 113/65 mmHg  Pulse 72  Temp(Src) 97.6 F (36.4 C) (Oral)  Resp 17  Ht _0  (1.626 m)  Wt 167 lb 6.4 oz (75.932 kg)  BMI 28.72 kg/m2  SpO2 97%  GENERAL:alert, no distress and  comfortable SKIN: skin color, texture, turgor are normal, no rashes or significant lesions EYES: normal, conjunctiva are pink and non-injected, sclera clear OROPHARYNX:no exudate, no erythema and lips, buccal mucosa, and tongue normal  NECK: supple, thyroid normal size, non-tender, without nodularity LYMPH:  no palpable lymphadenopathy in the cervical, axillary or inguinal LUNGS: clear to auscultation and percussion with normal breathing effort HEART: regular rate & rhythm and no murmurs and no  lower extremity edema ABDOMEN:abdomen soft, non-tender and normal bowel sounds Musculoskeletal:no cyanosis of digits and no clubbing, no significant right arm swelling PSYCH: alert & oriented x 3 with fluent speech NEURO: no focal motor/sensory deficits Breasts: Breast inspection showed them to be symmetrical with no nipple discharge.  (+) Surgical incision site in both breasts are healing well, (+) mild skin pigmentation and edema secondary to radiation.. Some fullness underneath the surgical incision in the right breast. No other palpable mass or adenopathy.  LABORATORY DATA:  I have reviewed the data as listed CBC Latest Ref Rng 07/27/2015 01/08/2015 12/19/2014  WBC 3.9 - 10.3 10e3/uL 4.4 3.4(L) 3.6(L)  Hemoglobin 11.6 - 15.9 g/dL 12.4 11.2(L) 10.1(L)  Hematocrit 34.8 - 46.6 % 37.5 32.6(L) 29.7(L)  Platelets 145 - 400 10e3/uL 178 195 147    CMP Latest Ref Rng 07/27/2015 01/08/2015 12/19/2014  Glucose 70 - 140 mg/dl 84 86 103  BUN 7.0 - 26.0 mg/dL 18.5 15.4 19.5  Creatinine 0.6 - 1.1 mg/dL 0.7 0.8 0.7  Sodium 136 - 145 mEq/L 139 141 139  Potassium 3.5 - 5.1 mEq/L 4.0 4.0 3.6  CO2 22 - 29 mEq/L _0 Calcium 8.4 - 10.4 mg/dL 9.1 9.0 8.7  Total Protein 6.4 - 8.3 g/dL 6.9 6.8 6.9  Total Bilirubin 0.20 - 1.20 mg/dL <0.30 <0.30 <0.30  Alkaline Phos 40 - 150 U/L 80 109 99  AST 5 - 34 U/L _1 ALT 0 - 55 U/L _2 PATHOLOGY REPORT 06/05/2014 Diagnosis 01/15/2015 1. Breast,  lumpectomy, Left - FIBROCYSTIC CHANGE WITH ADENOSIS AND CALCIFICATIONS. - TREATMENT RELATED CHANGES. - THERE IS NO EVIDENCE OF MALIGNANCY. - SEE COMMENT. 2. Breast, lumpectomy, Right - FIBROCYSTIC CHANGES WITH ADENOSIS. - TREATMENT RELATED CHANGES. - THERE IS NO EVIDENCE OF MALIGNANCY. - SEE COMMENT. 3. Lymph node, sentinel, biopsy, right axillary #1 - THERE IS NO EVIDENCE OF CARCINOMA IN 1 OF 1 LYMPH NODE (0/1). - SEE COMMENT 4. Lymph node, sentinel, biopsy, right axillary #2 - THERE IS NO EVIDENCE OF CARCINOMA IN 1 OF 1 LYMPH NODE (0/1). - SEE COMMENT. 5. Lymph node, sentinel, biopsy, right axillary #3 - THERE IS NO EVIDENCE OF CARCINOMA IN 1 OF 1 LYMPH NODE (0/1). - SEE COMMENT.  Microscopic Comment 1. Carcinoma is not identified in the current specimen. The surgical resection margin(s) of the specimen were inked and microscopically evaluated. 2. Carcinoma is not identified in the current specimen. The surgical resection margin(s) of the specimen were inked and microscopically evaluated. 3. -5. There are no significant treatment related changes present in the histologically evaluated sections of the lymph nodes. (JBK:kh 01-19-15)  Diagnosis  07/04/2014 Breast, left, needle core biopsy, UOQ - DUCTAL CARCINOMA IN-SITU. - SEE COMMENT.  Microscopic Comment The ductal carcinoma in situ is low grade. The carcinoma cells are positive for e-cadherin and cytokeratin AE1/AE3. Smooth muscle myosin, calponin, and p63 stains highlight the presence of myoepithelial cells. Estrogen receptor and progesterone receptor studies will be performed and the results reported separately. The results were called to [facility] on 07-08-2014. IMMUNOHISTOCHEMICAL AND MORPHOMETRIC ANALYSIS BY THE AUTOMATED CELLULAR IMAGING SYSTEM (ACIS) Estrogen Receptor: 100%, POSITIVE, STRONG STAINING INTENSITY Progesterone Receptor: 28%, POSITIVE, WEAK STAINING INTENSITY   RADIOGRAPHIC  STUDIES: I have personally reviewed the radiological images as listed and agreed with the findings in the report.  Diagnostic mammogram b/l 06/12/2015 IMPRESSION: No mammographic evidence of malignancy in either breast.  RECOMMENDATION: Diagnostic  mammogram is suggested in 1 year. (Code:DM-B-01Y)    ASSESSMENT & PLAN:  70 year old postmenopausal woman, with past medical history of seizure and arthritis, presented with a palpable mass in the inner upper quadrant of her right breast. Biopsy revealed poorly differentiated carcinoma, triple negative.  1. Poorly differentiated mammary carcinoma in her right breast, cT2NxM0, triple negative, ypT0N0 - she now has completed new adjuvant chemotherapy , with complete pathological response - she also completed adjuvant breast irradiation, and tolerated well, - we discussed the surveillance plan,  Which includes annual screening mammogram, self-exam and follow-up visit every 3-4 months for the first 2 years, and every 6 months afterwards for additional 3 years. -Her annual screening mammogram in March 2017 was normal. -Lab reviewed with her, CBC and CMP are unremarkable, her exam was unremarkable today. There is no evidence of recurrence.  2. Left breast DCIS,  ER+/PR+ -The left breast DCIS has no residual tumor on the surgical sample, it was ER/PR strongly positive. -she is on chemoprevention with tamoxifen, tolerating well except moderate hot flash.  3. Hot flush -Likely secondary to tamoxifen. -We discussed the treatment option of Neurontin and SSRI. She is on antiseizure medications, will not be a good candidate for Neurontin. SSRI slightly increased risk of seizure. So I would not recommend also. -We'll continue monitoring for now. If the hot flash became unbearable, I'll probably stop her tamoxifen.  4. Genetic -She has multiple family members had breast cancer -her genetic testing was negative for the following 20 genes: ATM, BARD1,  BRCA1, BRCA2, BRIP1, CDH1, CHEK2, EPCAM, FANCC, MLH1, MSH2, MSH6, NBN, PALB2, PMS2, PTEN, RAD51C, RAD51D, TP53, and XRCC2  Plan: -continue tamoxifen  -I'll see her back in 4 months  All questions were answered. The patient knows to call the clinic with any problems, questions or concerns.  I spent 20 minutes counseling the patient face to face. The total time spent in the appointment was 30 minutes and more than 50% was on counseling.   We'll continue  Truitt Merle, MD 07/27/2015 8:50 AM

## 2015-07-28 DIAGNOSIS — M67361 Transient synovitis, right knee: Secondary | ICD-10-CM | POA: Diagnosis not present

## 2015-07-28 DIAGNOSIS — M1712 Unilateral primary osteoarthritis, left knee: Secondary | ICD-10-CM | POA: Diagnosis not present

## 2015-07-28 DIAGNOSIS — M2041 Other hammer toe(s) (acquired), right foot: Secondary | ICD-10-CM | POA: Diagnosis not present

## 2015-07-28 DIAGNOSIS — M25572 Pain in left ankle and joints of left foot: Secondary | ICD-10-CM | POA: Diagnosis not present

## 2015-08-11 IMAGING — US US BIOPSY
1 series · 13 of 17 positions shown · non-contrast
Comparison: PET-CT- 07/02/2014

INDICATION: History of breast cancer, now with hypermetabolic cervical
adenopathy worrisome for metastatic disease

EXAM:
ULTRASOUND-GUIDED CORE NEEDLE BIOPSY OF HYPERMETABOLIC LEFT
SUPRACLAVICULAR LYMPH NODE
TECHNIQUE: Informed written consent was obtained from the patient after a
discussion of the risks, benefits and alternatives to treatment.
Questions regarding the procedure were encouraged and answered.
Initial ultrasound scanning demonstrated an approximately 0.9 x
cm lymph node within the left supraclavicular fossa which correlates
with the hypermetabolic lymph node seen on preceding PET-CT. An
ultrasound image was saved for documentation purposes. The procedure
was planned. A timeout was performed prior to the initiation of the
procedure.

[Series 1: us biopsy · 0.04mm/px · 13 of 17 slices shown]
[im 1/17]
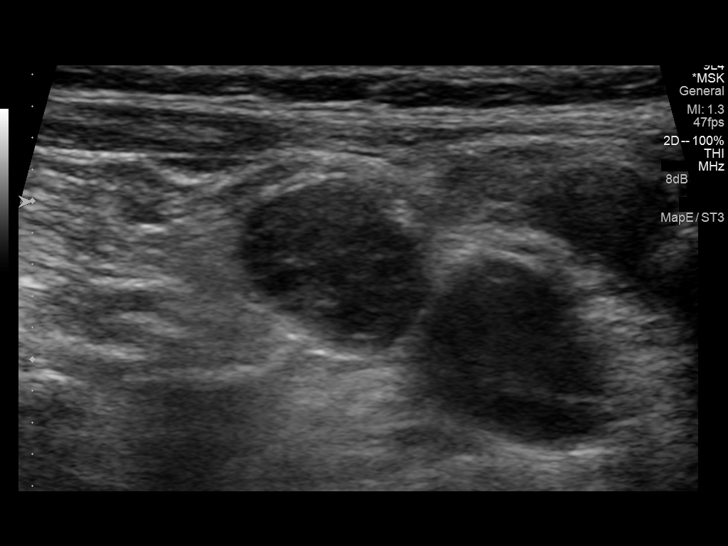
[im 2/17]
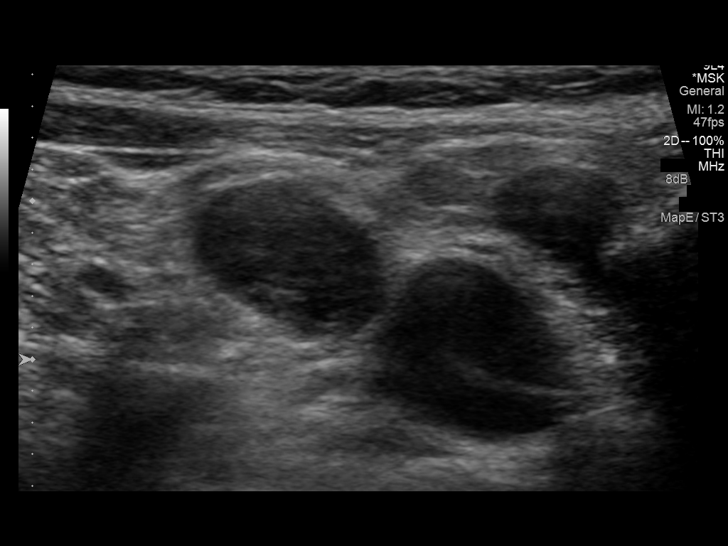
[im 4/17]
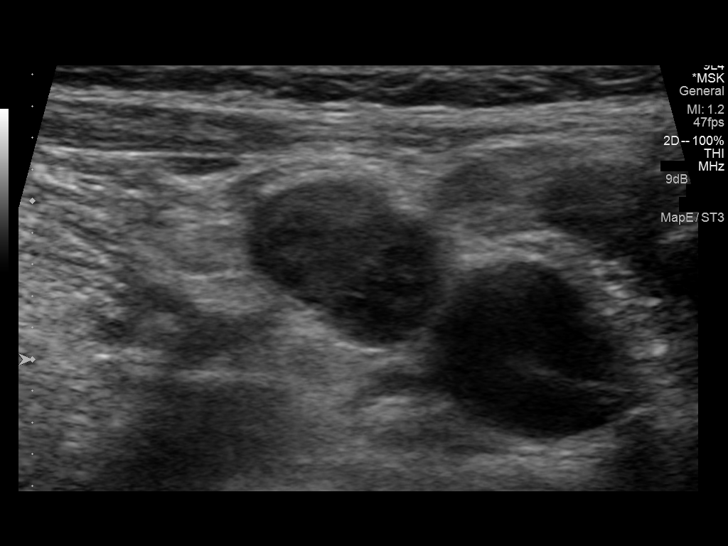
[im 5/17]
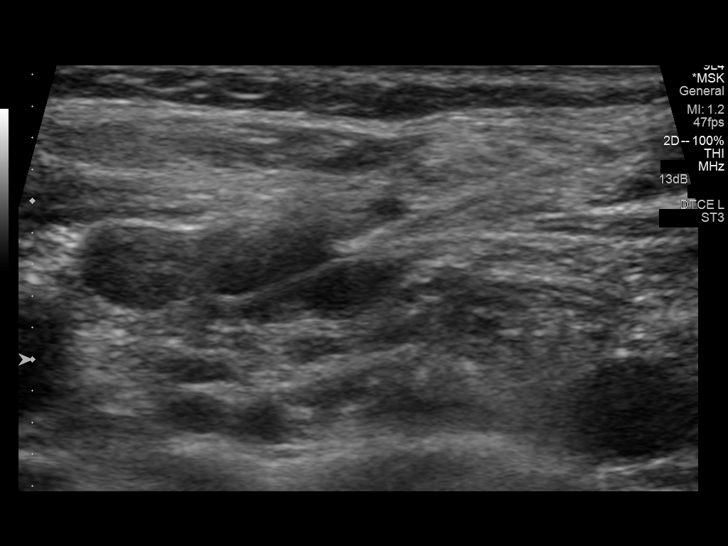
[im 6/17]
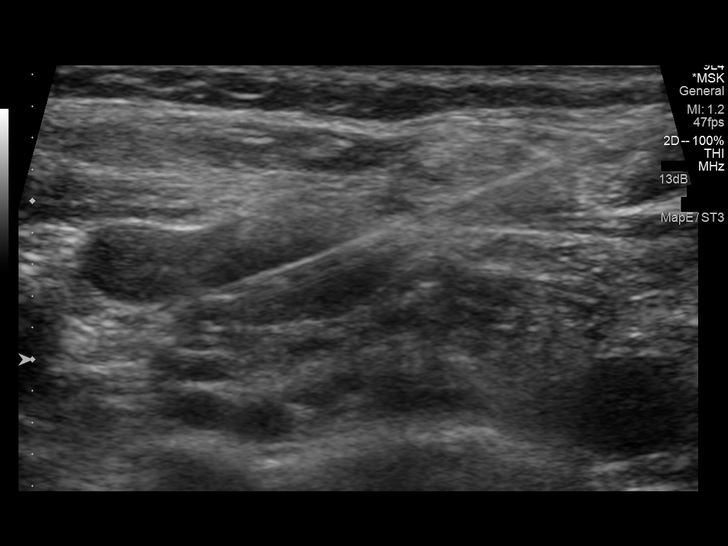
[im 8/17]
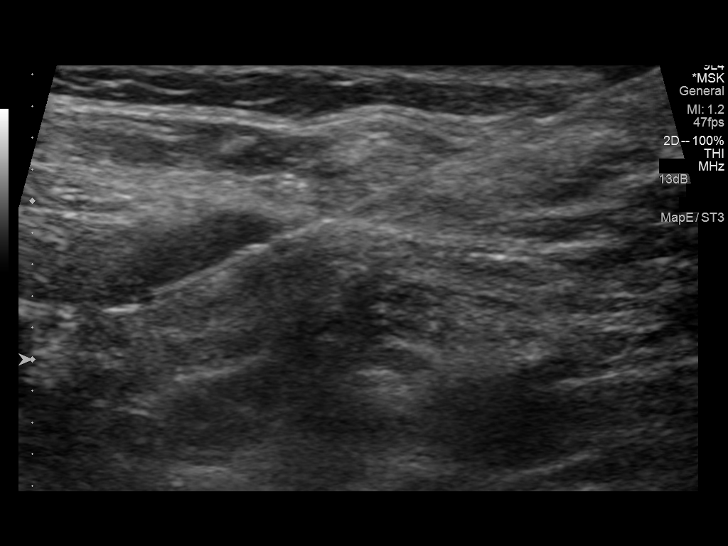
[im 9/17]
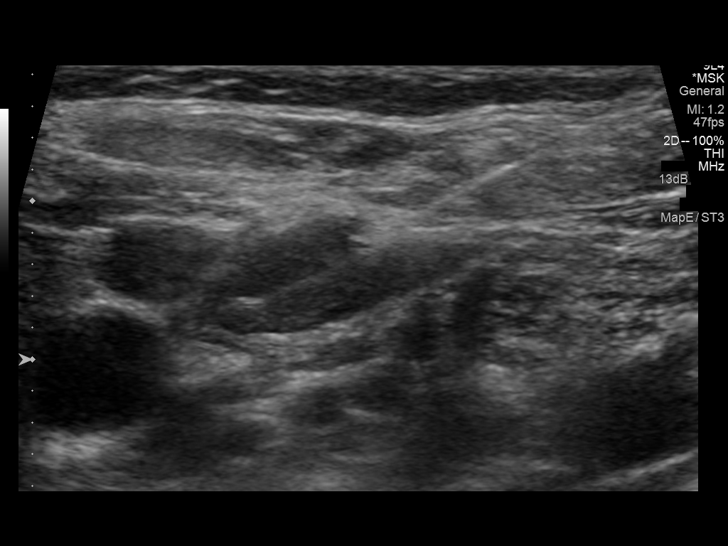
[im 10/17]
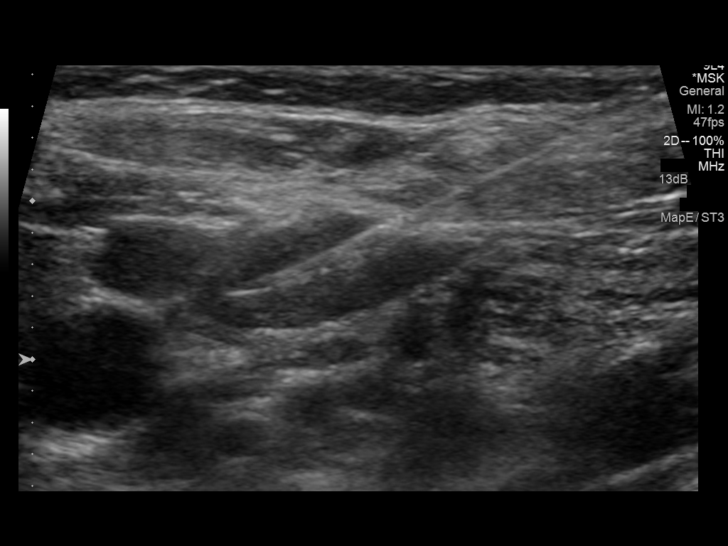
[im 12/17]
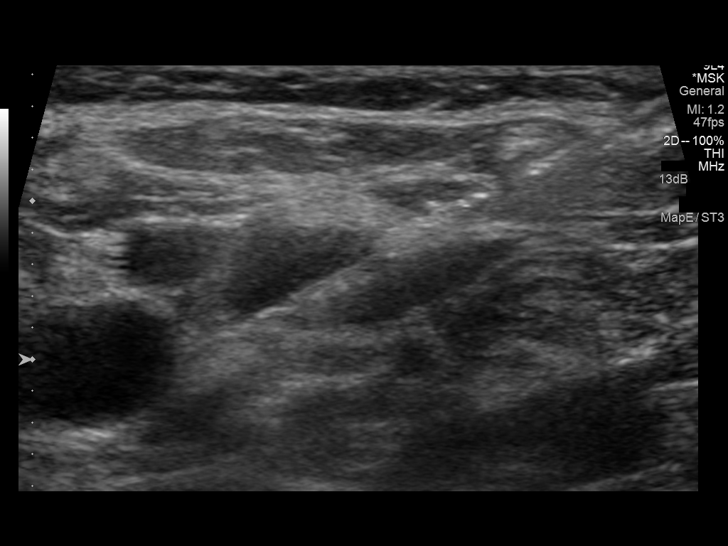
[im 13/17]
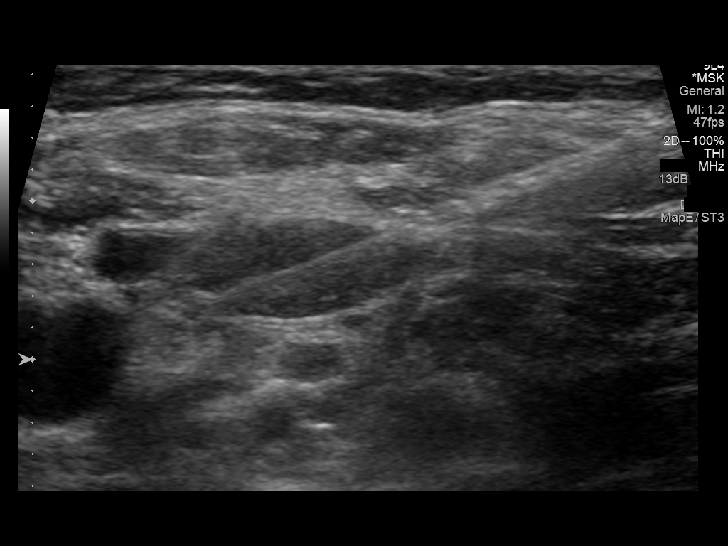
[im 14/17]
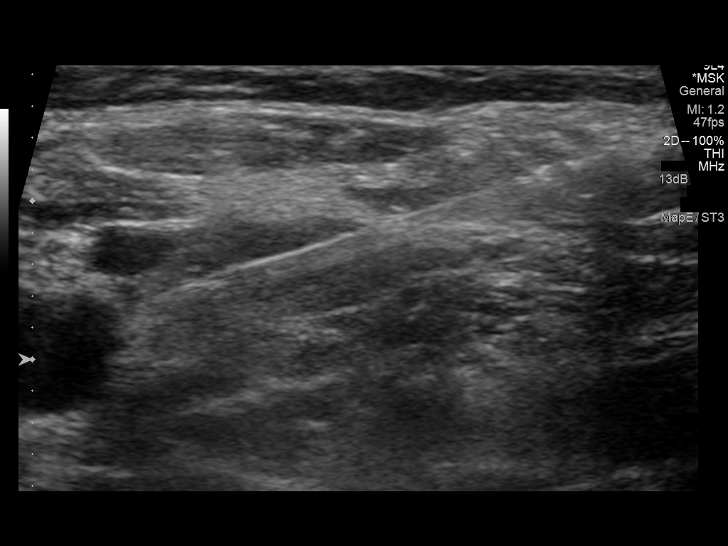
[im 16/17]
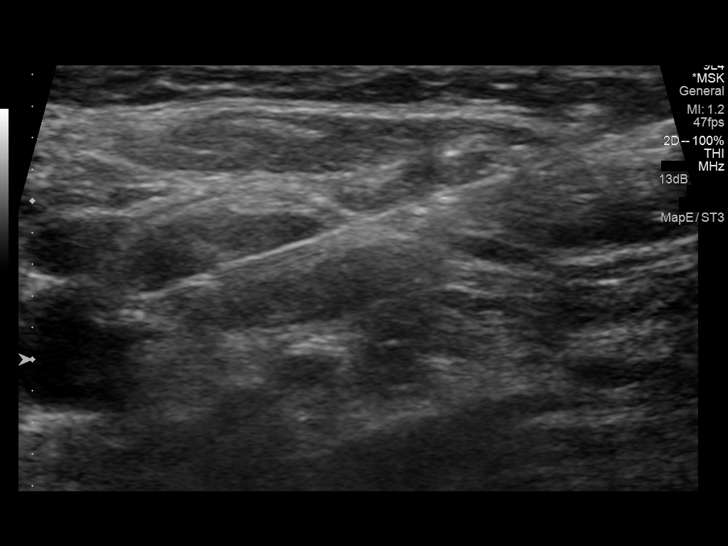
[im 17/17]
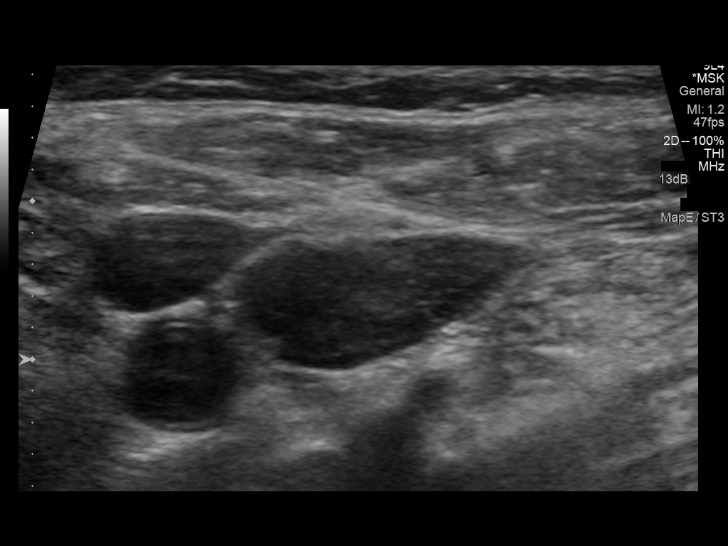

[13 of 17 positions shown; findings below may reference images not displayed]

MEDICATIONS:
Fentanyl 25 mcg IV; Versed 1 mg IV

ANESTHESIA/SEDATION:
Total Moderate Sedation time

11 minutes

COMPLICATIONS:
None immediate
The operative was prepped and draped in the usual sterile fashion,
and a sterile drape was applied covering the operative field. A
timeout was performed prior to the initiation of the procedure.
Local anesthesia was provided with 1% lidocaine with epinephrine.

Under direct ultrasound guidance, an 18 gauge core needle device was
utilized to obtain to obtain 6 core needle biopsied of the left
supraclavicular lymph node. Multiple ultrasound images were saved
for documentation purposes.

The samples were placed in saline and submitted to pathology. The
needle was removed and hemostasis was achieved with manual
compression. Post procedure scan was negative for significant
hematoma. A dressing was placed. The patient tolerated the procedure
well without immediate postprocedural complication.
IMPRESSION: Technically successful ultrasound guided left supraclavicular lymph
node biopsy.

## 2015-08-26 ENCOUNTER — Other Ambulatory Visit: Payer: Self-pay | Admitting: Hematology

## 2015-10-19 DIAGNOSIS — C50911 Malignant neoplasm of unspecified site of right female breast: Secondary | ICD-10-CM | POA: Diagnosis not present

## 2015-10-19 DIAGNOSIS — Z9079 Acquired absence of other genital organ(s): Secondary | ICD-10-CM | POA: Diagnosis not present

## 2015-10-19 DIAGNOSIS — Z9049 Acquired absence of other specified parts of digestive tract: Secondary | ICD-10-CM | POA: Diagnosis not present

## 2015-10-19 DIAGNOSIS — C50412 Malignant neoplasm of upper-outer quadrant of left female breast: Secondary | ICD-10-CM | POA: Diagnosis not present

## 2015-10-19 DIAGNOSIS — Z90710 Acquired absence of both cervix and uterus: Secondary | ICD-10-CM | POA: Diagnosis not present

## 2015-10-19 DIAGNOSIS — C50211 Malignant neoplasm of upper-inner quadrant of right female breast: Secondary | ICD-10-CM | POA: Diagnosis not present

## 2015-10-19 DIAGNOSIS — Z90722 Acquired absence of ovaries, bilateral: Secondary | ICD-10-CM | POA: Diagnosis not present

## 2015-10-23 DIAGNOSIS — M5090 Cervical disc disorder, unspecified, unspecified cervical region: Secondary | ICD-10-CM | POA: Diagnosis not present

## 2015-10-23 DIAGNOSIS — M5412 Radiculopathy, cervical region: Secondary | ICD-10-CM | POA: Diagnosis not present

## 2015-10-23 DIAGNOSIS — M542 Cervicalgia: Secondary | ICD-10-CM | POA: Diagnosis not present

## 2015-10-23 DIAGNOSIS — M25512 Pain in left shoulder: Secondary | ICD-10-CM | POA: Diagnosis not present

## 2015-11-12 DIAGNOSIS — M5412 Radiculopathy, cervical region: Secondary | ICD-10-CM | POA: Diagnosis not present

## 2015-11-12 DIAGNOSIS — M7552 Bursitis of left shoulder: Secondary | ICD-10-CM | POA: Diagnosis not present

## 2015-11-12 DIAGNOSIS — M25512 Pain in left shoulder: Secondary | ICD-10-CM | POA: Diagnosis not present

## 2015-11-25 DIAGNOSIS — M5412 Radiculopathy, cervical region: Secondary | ICD-10-CM | POA: Diagnosis not present

## 2015-11-25 DIAGNOSIS — M5090 Cervical disc disorder, unspecified, unspecified cervical region: Secondary | ICD-10-CM | POA: Diagnosis not present

## 2015-11-27 ENCOUNTER — Encounter: Payer: Self-pay | Admitting: Hematology

## 2015-11-27 ENCOUNTER — Telehealth: Payer: Self-pay | Admitting: Hematology

## 2015-11-27 ENCOUNTER — Ambulatory Visit (HOSPITAL_BASED_OUTPATIENT_CLINIC_OR_DEPARTMENT_OTHER): Payer: Medicare Other | Admitting: Hematology

## 2015-11-27 ENCOUNTER — Other Ambulatory Visit (HOSPITAL_BASED_OUTPATIENT_CLINIC_OR_DEPARTMENT_OTHER): Payer: Medicare Other

## 2015-11-27 VITALS — BP 121/95 | HR 77 | Temp 97.9°F | Resp 17 | Ht 64.0 in | Wt 165.7 lb

## 2015-11-27 DIAGNOSIS — D0512 Intraductal carcinoma in situ of left breast: Secondary | ICD-10-CM

## 2015-11-27 DIAGNOSIS — C50211 Malignant neoplasm of upper-inner quadrant of right female breast: Secondary | ICD-10-CM

## 2015-11-27 LAB — CBC WITH DIFFERENTIAL/PLATELET
BASO%: 0.7 % (ref 0.0–2.0)
Basophils Absolute: 0 10*3/uL (ref 0.0–0.1)
EOS%: 4.4 % (ref 0.0–7.0)
Eosinophils Absolute: 0.2 10*3/uL (ref 0.0–0.5)
HCT: 38.6 % (ref 34.8–46.6)
HGB: 13.1 g/dL (ref 11.6–15.9)
LYMPH%: 19.2 % (ref 14.0–49.7)
MCH: 31.6 pg (ref 25.1–34.0)
MCHC: 33.9 g/dL (ref 31.5–36.0)
MCV: 93.2 fL (ref 79.5–101.0)
MONO#: 0.7 10*3/uL (ref 0.1–0.9)
MONO%: 16.5 % — ABNORMAL HIGH (ref 0.0–14.0)
NEUT#: 2.4 10*3/uL (ref 1.5–6.5)
NEUT%: 59.2 % (ref 38.4–76.8)
Platelets: 163 10*3/uL (ref 145–400)
RBC: 4.14 10*6/uL (ref 3.70–5.45)
RDW: 12.6 % (ref 11.2–14.5)
WBC: 4.1 10*3/uL (ref 3.9–10.3)
lymph#: 0.8 10*3/uL — ABNORMAL LOW (ref 0.9–3.3)

## 2015-11-27 LAB — COMPREHENSIVE METABOLIC PANEL
ALT: 10 U/L (ref 0–55)
AST: 14 U/L (ref 5–34)
Albumin: 3.2 g/dL — ABNORMAL LOW (ref 3.5–5.0)
Alkaline Phosphatase: 92 U/L (ref 40–150)
Anion Gap: 12 mEq/L — ABNORMAL HIGH (ref 3–11)
BUN: 17.1 mg/dL (ref 7.0–26.0)
CO2: 24 mEq/L (ref 22–29)
Calcium: 9 mg/dL (ref 8.4–10.4)
Chloride: 104 mEq/L (ref 98–109)
Creatinine: 0.8 mg/dL (ref 0.6–1.1)
EGFR: 78 mL/min/{1.73_m2} — ABNORMAL LOW (ref >90)
Glucose: 110 mg/dl (ref 70–140)
Potassium: 4 mEq/L (ref 3.5–5.1)
Sodium: 141 mEq/L (ref 136–145)
Total Bilirubin: <0.3 mg/dL (ref 0.20–1.20)
Total Protein: 7.2 g/dL (ref 6.4–8.3)

## 2015-11-27 NOTE — Telephone Encounter (Signed)
Gave patient avs report and appointments for October. Central radiology will call re scan - patient aware.  °

## 2015-11-27 NOTE — Progress Notes (Signed)
[place]  [phone] [phone]  Clinic Follow Up Note   Patient Care Team: Lonzo Cloud, MD as PCP - General (General Practice) Lonzo Cloud, MD (General Practice) Fanny Skates, MD as Consulting Physician (General Surgery) Truitt Merle, MD as Consulting Physician (Hematology) Thea Silversmith, MD as Consulting Physician (Radiation Oncology) Rockwell Germany, RN as Registered Nurse Mauro Kaufmann, RN as Registered Nurse Holley Bouche, NP as Nurse Practitioner (Nurse Practitioner) Sylvan Cheese, NP as Nurse Practitioner (Hematology and Oncology) 11/27/2015  CHIEF COMPLAINTS:  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 Ultrasound of the right axilla is negative      06/05/2014 Initial Biopsy    Right breast core needle bx: invasive mammary carcinoma, ER- (0%), PR- (0%), HER2/neu negative, Ki67: 98%      07/02/2014 PET scan    1. Intensely hypermetabolic mass in the medial RIGHT chest wall with hypermetabolic right axillary node 2. Hypermetabolic nodal metastasis in the LEFT level 2 and LEFT supraclavicular and periportal node (SUV 5).       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      07/28/2014 Pathology Results    10R and 10L lymph node biopsy showed no malignant cells, mixed lymphoid population.      08/11/2014 Miscellaneous    nadir CBC: WBC 0.54, hemoglobin 8.9, hematocrit 26%, platelet 125      01/12/2015 Procedure    Breast/Ovarian ([facility]) panel: no clinically sig variant at ATM, BARD1, BRCA1, BRCA2, BRIP1, CDH1, CHEK2, EPCAM, FANCC, MLH1, MSH2, MSH6, NBN, PALB2, PMS2, PTEN, RAD51C, RAD51D, TP53, and XRCC2      01/15/2015 Surgery    Bilateral lumpectomy and right sentinel lymph node biopsy.      01/15/2015 Pathology Results    Bilateral breast lumpectomy showed fibrocystic change, treatment-related changes, no residual malignancy. 3 right axillary sentinel lymph nodes were negative.      02/24/2015 - 04/14/2015 Radiation Therapy    Right breast 45 Gy over 25 fractions; right supraclav fossa/axilla: 45 Gy over 25 fractions; right breast boost: 16 Gy over 8 fractions.  Total dose RIGHT: 61 Gy  Left breast: 50.4 Gy over 28 fractions.      04/2015 -  Anti-estrogen oral therapy    Tamoxifen 20 mg daily      07/02/2015 Survivorship    Survivorship visit completed  HISTORY OF PRESENTING ILLNESS:  Kristin Pennington 70 y.o. female is here because of recently diagnosed poorly differentiated carcinoma in her right breast.  She found a lump in her right breast 3 weeks ago, mild tenderness,  No other complains. Last mammo was in oct 2015 which was negative. She feels well overall. She denies any pain, cough, shortness breast, GI symptoms. She has good energy level and appetite, no recent weight loss.  Her last colonoscopy was for 5 years ago which was negative per patient. Her last Pap smear was 3 years ago which was negative per patient.  CURRERNT THERAPY:  observation  INTERIM  HISTORY: [name] returns for follow-up. She  Is accompanied by her niece to the clinic today. Her left shoulder has been bothering her for the past month, she described as dull pain at the shoulder blade, radiated to her left elbow, mild numbness at the left hand, no neck pain, no live in the range of left shoulder, no significant left arm swelling. She was seen by her orthopedic surgeon Dr. Megan Salon in [place], and received a steroid injection, which helped only for a few days. She is scheduled to have a left it showed MRI soon.  She otherwise doing well, tolerating tamoxifen well, moderate hot flash, tolerable. No significant other side effects. She has good appetite and energy level, doing well overall.  MEDICAL HISTORY:  Past Medical History:  Diagnosis Date  . Arthritis    knee  . Breast cancer ([place])    bilateral, right breast mass, cancer both breast, x1 chemo  a week ago.  Marland Kitchen GERD (gastroesophageal reflux disease)    with chemo  . Glaucoma    "early" - eye doctor is watching  . Pneumonia    2015  . 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 Wears glasses   . Wears partial dentures    top     SURGICAL HISTORY: Past Surgical History:  Procedure Laterality Date  . ABDOMINAL HYSTERECTOMY    . APPENDECTOMY    . BREAST LUMPECTOMY WITH RADIOACTIVE SEED AND SENTINEL LYMPH NODE BIOPSY Right 01/15/2015   Procedure: RIGHT BREAST LUMPECTOMY WITH RADIOACTIVE SEED AND SENTINEL LYMPH NODE BIOPSY;  Surgeon: Fanny Skates, MD;  Location: [facility];  Service: General;  Laterality: Right;  . BREAST LUMPECTOMY WITH RADIOACTIVE SEED LOCALIZATION Left 01/15/2015   Procedure: LEFT BREAST LUMPECTOMY WITH RADIOACTIVE SEED LOCALIZATION;  Surgeon: Fanny Skates, MD;  Location: [facility];  Service: General;  Laterality: Left;  . CARPAL TUNNEL RELEASE Left   . CHOLECYSTECTOMY    . COLONOSCOPY    . ENDOBRONCHIAL ULTRASOUND Bilateral 07/28/2014   Procedure: ENDOBRONCHIAL ULTRASOUND;  Surgeon: Collene Gobble, MD;  Location: [facility];  Service: Cardiopulmonary;  Laterality: Bilateral;  . PORT-A-CATH REMOVAL Left 01/15/2015   Procedure: REMOVAL PORT-A-CATH;  Surgeon: Fanny Skates, MD;  Location: [place];  Service: General;  Laterality: Left;  . PORTACATH PLACEMENT N/A 06/30/2014   Procedure: INSERTION PORT-A-CATH WITH ULTRA SOUND ON STANDBY;  Surgeon: Fanny Skates, MD;  Location: [place];  Service: General;  Laterality: N/A;  . TONSILLECTOMY     SOCIAL HISTORY: Social History   Social History  . Marital status: Married    Spouse name: N/A  . Number of children: N/A  . Years of education: N/A   Occupational History  . Not on file.   Social History Main Topics  . Smoking status: Former Smoker    Packs/day: 1.00  Years: 18.00    Types: Cigarettes    Quit date: 11/08/1988  . Smokeless tobacco: Never Used  . Alcohol use No  . Drug use: No  . Sexual activity: Not on file   Other Topics Concern  . Not on file   Social History Narrative  . No narrative on file     GYN HISTORY  Menarchal: 11 LMP: 1994, hysrectomy  Contraceptive: no  HRT: since 1994  G2P1, one miscarriage     FAMILY HISTORY: Family History  Problem Relation Age of Onset  . Stomach cancer Father 25  . Lung cancer Sister 26    not sure if primary or breast cancer met  . Breast cancer Maternal Aunt 72  . Breast cancer Cousin 35  . Heart Problems Paternal Aunt   . Congestive Heart Failure Mother     ALLERGIES:  is allergic to aspirin; codeine; diclofenac; meloxicam; nitrofurantoin; other; pyridium [phenazopyridine hcl]; sulfa antibiotics; tetracyclines & related; and tape.  MEDICATIONS:  Current Outpatient Prescriptions  Medication Sig Dispense Refill  . aspirin EC 81 MG tablet Take 81 mg by mouth daily.     . Calcium Carb-Cholecalciferol (CALCIUM 600+D3) 600-400 MG-UNIT TABS Take 1 tablet by mouth 2 (two) times daily.    . cyanocobalamin (,VITAMIN B-12,) 1000 MCG/ML  injection Inject 1,000 mcg into the muscle every 30 (thirty) days.     Marland Kitchen HYDROcodone-acetaminophen (NORCO) 5-325 MG tablet Take 1-2 tablets by mouth every 6 (six) hours as needed for moderate pain or severe pain. (Patient not taking: Reported on 07/27/2015) 30 tablet 0  . levETIRAcetam (KEPPRA) 500 MG tablet Take 1 tablet by mouth 2 (two) times daily.    . ondansetron (ZOFRAN) 8 MG tablet Take 8 mg by mouth every 8 (eight) hours as needed for nausea or vomiting. Reported on 07/27/2015    . oxaprozin (DAYPRO) 600 MG tablet Take 1 tablet by mouth 2 (two) times daily. Reported on 07/27/2015    . phenytoin (DILANTIN) 100 MG ER capsule Take 1 tablet by mouth 4 (four) times daily. On  Sat , Sun, Tues, Wed, and  Thursdays    -  Take   137m  QID.    .Marland Kitchenprochlorperazine (COMPAZINE) 10 MG tablet Take 1 tablet by mouth every 8 (eight) hours as needed for nausea or vomiting. Reported on 07/27/2015  0  . sodium chloride (OCEAN) 0.65 % SOLN nasal spray Place 1 spray into both nostrils as needed for congestion.    . tamoxifen (NOLVADEX) 20 MG tablet Take 1 tablet (20 mg total) by mouth daily. 30 tablet 11  . Tetrahydrozoline HCl ([employer] EYE DROPS OP) Apply 1 drop to eye 3 (three) times daily.     No current facility-administered medications for this visit.     REVIEW OF SYSTEMS:   Constitutional: Denies fevers, chills or abnormal night sweats Eyes: Denies blurriness of vision, double vision or watery eyes Ears, nose, mouth, throat, and face: Denies mucositis or sore throat Respiratory: Denies cough, dyspnea or wheezes Cardiovascular: Denies palpitation, chest discomfort or lower extremity swelling Gastrointestinal:  Denies nausea, heartburn or change in bowel habits Skin: Denies abnormal skin rashes Lymphatics: Denies new lymphadenopathy or easy bruising Neurological:Denies numbness, tingling or new weaknesses Behavioral/Psych: Mood is stable, no new changes  All other systems were reviewed with the patient and are  negative.  PHYSICAL EXAMINATION: ECOG PERFORMANCE STATUS: 1  BP (!) 121/95 (Patient Position: Sitting)   Pulse 77   Temp 97.9 F (36.6 C) (Oral)  Resp 17   Ht '5\' 4"'  (1.626 m)   Wt 165 lb 11.2 oz (75.2 kg)   SpO2 98%   BMI 28.44 kg/m   GENERAL:alert, no distress and comfortable SKIN: skin color, texture, turgor are normal, no rashes or significant lesions EYES: normal, conjunctiva are pink and non-injected, sclera clear OROPHARYNX:no exudate, no erythema and lips, buccal mucosa, and tongue normal  NECK: supple, thyroid normal size, non-tender, without nodularity LYMPH:  no palpable lymphadenopathy in the cervical, axillary or inguinal LUNGS: clear to auscultation and percussion with normal breathing effort HEART: regular rate & rhythm and no murmurs and no lower extremity edema ABDOMEN:abdomen soft, non-tender and normal bowel sounds Musculoskeletal:no cyanosis of digits and no clubbing, no significant right arm swelling PSYCH: alert & oriented x 3 with fluent speech NEURO: no focal motor/sensory deficits Breasts: Breast inspection showed them to be symmetrical with no nipple discharge.  (+) Surgical incision site in both breasts have healed well, no significant scar. are healing well, no significant skin pigmentation or edema. No other palpable mass or adenopathy.  LABORATORY DATA:  I have reviewed the data as listed CBC Latest Ref Rng & Units 11/27/2015 07/27/2015 01/08/2015  WBC 3.9 - 10.3 10e3/uL 4.1 4.4 3.4(L)  Hemoglobin 11.6 - 15.9 g/dL 13.1 12.4 11.2(L)  Hematocrit 34.8 - 46.6 % 38.6 37.5 32.6(L)  Platelets 145 - 400 10e3/uL 163 178 195    CMP Latest Ref Rng & Units 07/27/2015 01/08/2015 12/19/2014  Glucose 70 - 140 mg/dl 84 86 103  BUN 7.0 - 26.0 mg/dL 18.5 15.4 19.5  Creatinine 0.6 - 1.1 mg/dL 0.7 0.8 0.7  Sodium 136 - 145 mEq/L 139 141 139  Potassium 3.5 - 5.1 mEq/L 4.0 4.0 3.6  Chloride 96 - 112 mmol/L - - -  CO2 22 - 29 mEq/L '27 27 26  ' Calcium 8.4 - 10.4 mg/dL 9.1  9.0 8.7  Total Protein 6.4 - 8.3 g/dL 6.9 6.8 6.9  Total Bilirubin 0.20 - 1.20 mg/dL <0.30 <0.30 <0.30  Alkaline Phos 40 - 150 U/L 80 109 99  AST 5 - 34 U/L '15 13 15  ' ALT 0 - 55 U/L '12 10 11      ' PATHOLOGY REPORT 06/05/2014 Diagnosis 01/15/2015 1. Breast, lumpectomy, Left - FIBROCYSTIC CHANGE WITH ADENOSIS AND CALCIFICATIONS. - TREATMENT RELATED CHANGES. - THERE IS NO EVIDENCE OF MALIGNANCY. - SEE COMMENT. 2. Breast, lumpectomy, Right - FIBROCYSTIC CHANGES WITH ADENOSIS. - TREATMENT RELATED CHANGES. - THERE IS NO EVIDENCE OF MALIGNANCY. - SEE COMMENT. 3. Lymph node, sentinel, biopsy, right axillary #1 - THERE IS NO EVIDENCE OF CARCINOMA IN 1 OF 1 LYMPH NODE (0/1). - SEE COMMENT 4. Lymph node, sentinel, biopsy, right axillary #2 - THERE IS NO EVIDENCE OF CARCINOMA IN 1 OF 1 LYMPH NODE (0/1). - SEE COMMENT. 5. Lymph node, sentinel, biopsy, right axillary #3 - THERE IS NO EVIDENCE OF CARCINOMA IN 1 OF 1 LYMPH NODE (0/1). - SEE COMMENT.  Microscopic Comment 1. Carcinoma is not identified in the current specimen. The surgical resection margin(s) of the specimen were inked and microscopically evaluated. 2. Carcinoma is not identified in the current specimen. The surgical resection margin(s) of the specimen were inked and microscopically evaluated. 3. -5. There are no significant treatment related changes present in the histologically evaluated sections of the lymph nodes. (JBK:kh 01-19-15)  Diagnosis  07/04/2014 Breast, left, needle core biopsy, UOQ - DUCTAL CARCINOMA IN-SITU. - SEE COMMENT.  Microscopic Comment The ductal carcinoma in situ is low grade. The  carcinoma cells are positive for e-cadherin and cytokeratin AE1/AE3. Smooth muscle myosin, calponin, and p63 stains highlight the presence of myoepithelial cells. Estrogen receptor and progesterone receptor studies will be performed and the results reported separately. The results were called to [facility] on 07-08-2014. IMMUNOHISTOCHEMICAL AND MORPHOMETRIC ANALYSIS BY THE AUTOMATED CELLULAR IMAGING SYSTEM (ACIS) Estrogen Receptor: 100%, POSITIVE, STRONG STAINING INTENSITY Progesterone Receptor: 28%, POSITIVE, WEAK STAINING INTENSITY   RADIOGRAPHIC STUDIES: I have personally reviewed the radiological images as listed and agreed with the findings in the report.  Diagnostic mammogram b/l 06/12/2015 IMPRESSION: No mammographic evidence of malignancy in either breast.  RECOMMENDATION: Diagnostic mammogram is suggested in 1 year. (Code:DM-B-01Y)    ASSESSMENT & PLAN:  70 year old postmenopausal woman, with past medical history of seizure and arthritis, presented with a palpable mass in the inner upper quadrant of her right breast. Biopsy revealed poorly differentiated carcinoma, triple negative.  1. Poorly differentiated mammary carcinoma in her right breast, cT2NxM0, triple negative, ypT0N0 - she now has completed new adjuvant chemotherapy , with complete pathological response - she also completed adjuvant breast irradiation, and tolerated well, - we discussed the surveillance plan,  Which includes annual screening mammogram, self-exam and follow-up visit every 3-4 months for the first 2 years, and every 6 months afterwards for additional 3 years. -Her annual screening mammogram in March 2017 was normal. -Lab reviewed with her, CBC is normal, CMP still pending, her exam was unremarkable today.  -due to her recent left shoulder pain, and prior multiple positive lymph node on the initial PET scan, I recommend her to have a repeat PET scan   2. Left breast DCIS,  ER+/PR+ -The left breast DCIS has no residual tumor on the surgical sample, it was ER/PR strongly positive. -she is on chemoprevention with tamoxifen, tolerating well except moderate hot flash.  3. Left shoulder blade pain -She is seeing her orthopedic surgeon Dr. Megan Salon, scheduled to have a left shoulder MRI -I  requested her MRI reported to be copy to me. If abnormal, will do PET earlier   4. Hot flush -Secondary to tamoxifen. -Tolerable, patient previously declined medication. We'll continue monitoring.   5. Genetic -She has multiple family members had breast cancer -her genetic testing was negative for the following 20 genes: ATM, BARD1, BRCA1, BRCA2, BRIP1, CDH1, CHEK2, EPCAM, FANCC, MLH1, MSH2, MSH6, NBN, PALB2, PMS2, PTEN, RAD51C, RAD51D, TP53, and XRCC2  Plan: -continue tamoxifen  -I'll see her back in 2 months -She'll get a bone density scan at her primary care physician's office soon  -PET scan before next visit, sooner if her left shoulder MRI abnormal   All questions were answered. The patient knows to call the clinic with any problems, questions or concerns.  I spent 20 minutes counseling the patient face to face. The total time spent in the appointment was 30 minutes and more than 50% was on counseling.   We'll continue  Truitt Merle, MD 11/27/2015 8:43 AM

## 2015-12-01 DIAGNOSIS — M5090 Cervical disc disorder, unspecified, unspecified cervical region: Secondary | ICD-10-CM | POA: Diagnosis not present

## 2015-12-01 DIAGNOSIS — M5412 Radiculopathy, cervical region: Secondary | ICD-10-CM | POA: Diagnosis not present

## 2015-12-21 DIAGNOSIS — M542 Cervicalgia: Secondary | ICD-10-CM | POA: Diagnosis not present

## 2015-12-21 DIAGNOSIS — M5412 Radiculopathy, cervical region: Secondary | ICD-10-CM | POA: Diagnosis not present

## 2015-12-21 DIAGNOSIS — M5090 Cervical disc disorder, unspecified, unspecified cervical region: Secondary | ICD-10-CM | POA: Diagnosis not present

## 2016-01-04 DIAGNOSIS — Z23 Encounter for immunization: Secondary | ICD-10-CM | POA: Diagnosis not present

## 2016-01-13 ENCOUNTER — Telehealth: Payer: Self-pay | Admitting: *Deleted

## 2016-01-13 ENCOUNTER — Other Ambulatory Visit: Payer: Self-pay | Admitting: Hematology

## 2016-01-13 NOTE — Telephone Encounter (Signed)
Patient requested that orders be sent to [facility] in [place] for her labs [email or web address], so she will not need to make an extra trip here just for labs.   Request faxed to [phone] with a request to fax results to [place] at [phone]

## 2016-01-14 DIAGNOSIS — M5412 Radiculopathy, cervical region: Secondary | ICD-10-CM | POA: Diagnosis not present

## 2016-01-14 DIAGNOSIS — M5090 Cervical disc disorder, unspecified, unspecified cervical region: Secondary | ICD-10-CM | POA: Diagnosis not present

## 2016-01-14 DIAGNOSIS — E663 Overweight: Secondary | ICD-10-CM | POA: Diagnosis not present

## 2016-01-14 DIAGNOSIS — M542 Cervicalgia: Secondary | ICD-10-CM | POA: Diagnosis not present

## 2016-01-14 DIAGNOSIS — C50211 Malignant neoplasm of upper-inner quadrant of right female breast: Secondary | ICD-10-CM | POA: Diagnosis not present

## 2016-01-15 ENCOUNTER — Other Ambulatory Visit: Payer: Medicare Other

## 2016-01-17 IMAGING — MR MR BREAST BILAT WO/W CM
5 of 11 series · 22 of 48 positions shown · IV contrast (15ml multihance)
Comparison: 06/24/2014 breast MRI

CLINICAL DATA: History of a 3.6 cm right breast upper inner
quadrant triple negative invasive mammary carcinoma with 3.8 cm area
of DCIS (MR guided biopsy) located within the upper outer quadrant
of the left breast. Also, hypermetabolic lymph nodes within the
right hilum and left superior clavicular region which were seen on
PET scan were biopsied and were shown to represent benign reactive
lymph nodes. No evidence for metastatic adenopathy. Patient is
undergoing neoadjuvant chemotherapy. Followup evaluation.

LABS:  Not applicable
EXAM:
BILATERAL BREAST MRI WITH AND WITHOUT CONTRAST
TECHNIQUE: Multiplanar, multisequence MR images of both breasts were obtained
prior to and following the intravenous administration of 15 ml of
multi

[Series 3: t2_tirm_tra ipat (a-p) · axial · 3.0mm · 0.70mm/px · z∈[-96,+66]mm · 3 of 55 slices shown]
[im 1/55]
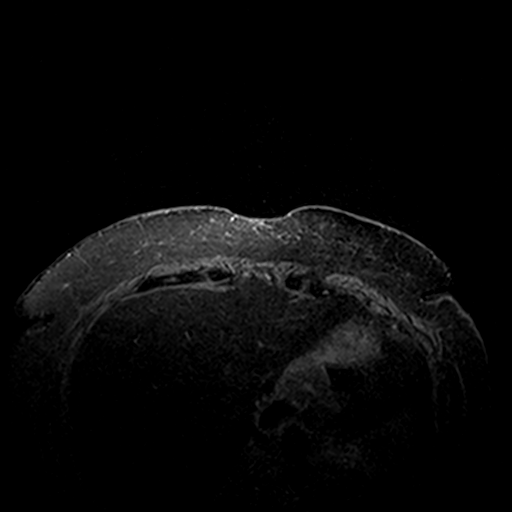
[im 28/55]
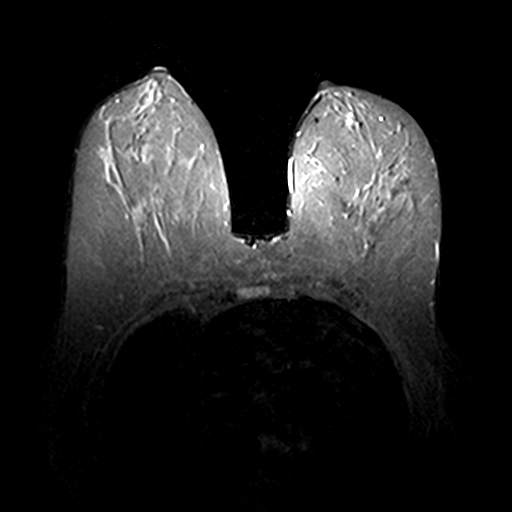
[im 55/55]
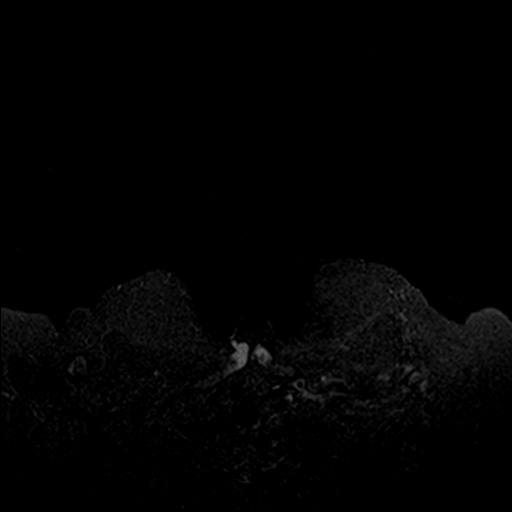

[Series 4: fl3d pre-cm no · axial · non-contrast · 1.2mm · 0.94mm/px · z∈[-101,+70]mm · 6 of 144 slices shown]
[im 1/144]
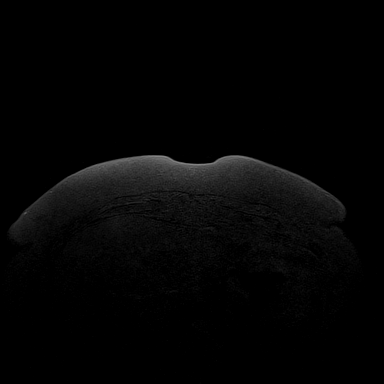
[im 29/144]
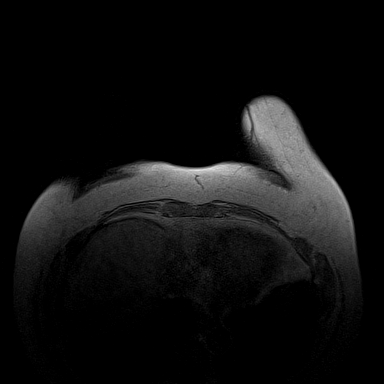
[im 58/144]
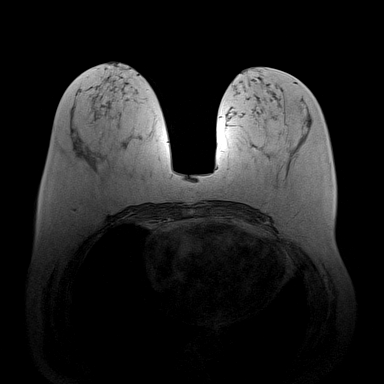
[im 86/144]
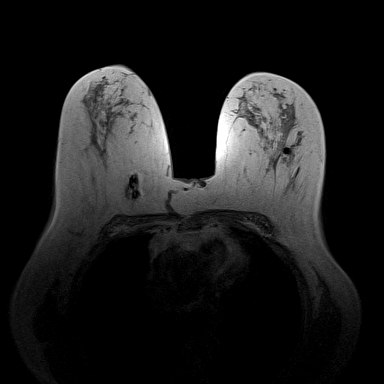
[im 115/144]
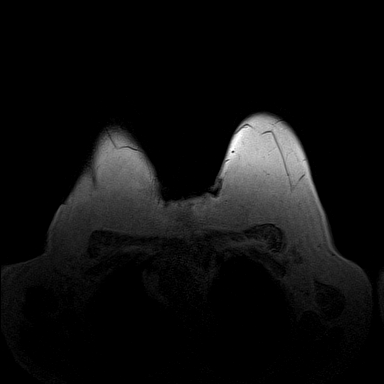
[im 144/144]
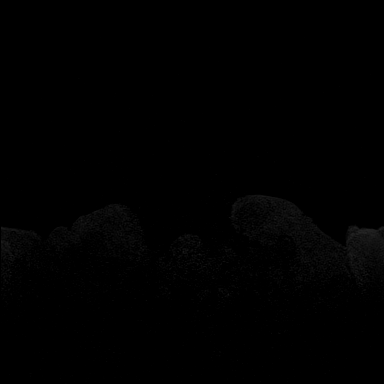

[Series 5: T2 · axial · 3.0mm · 0.47mm/px · z∈[-96,+66]mm · 3 of 55 slices shown]
[im 1/55]
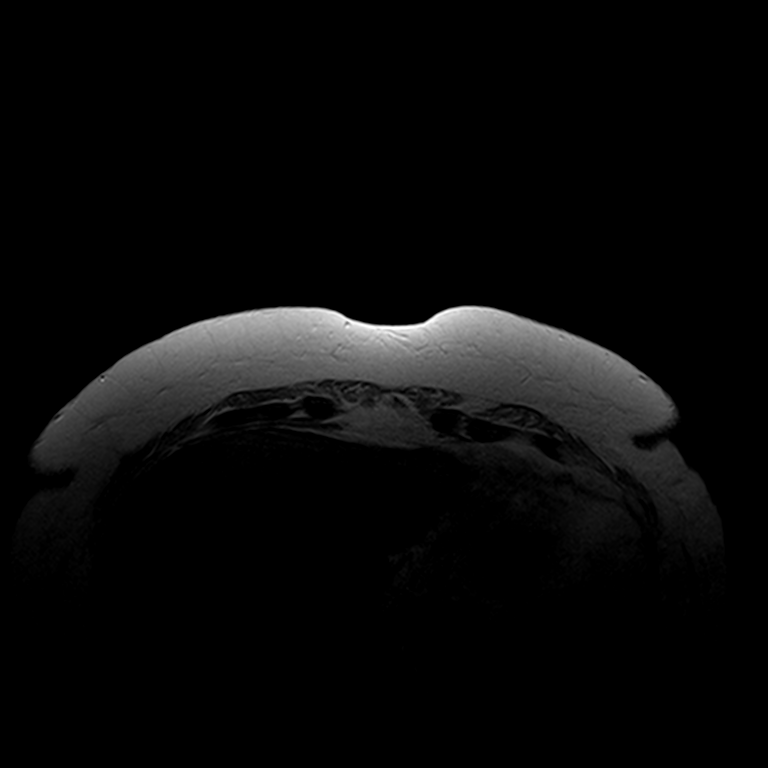
[im 28/55]
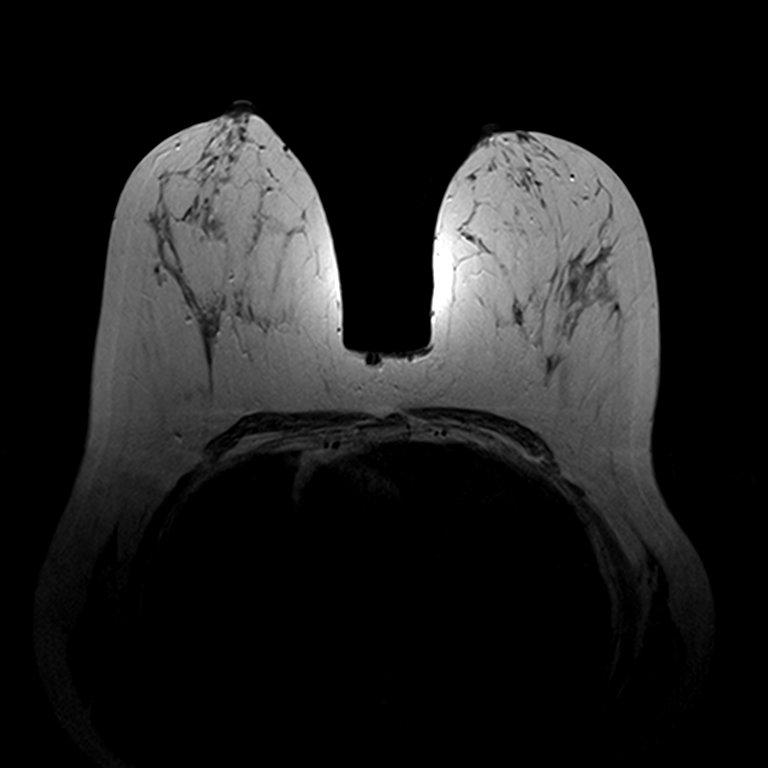
[im 55/55]
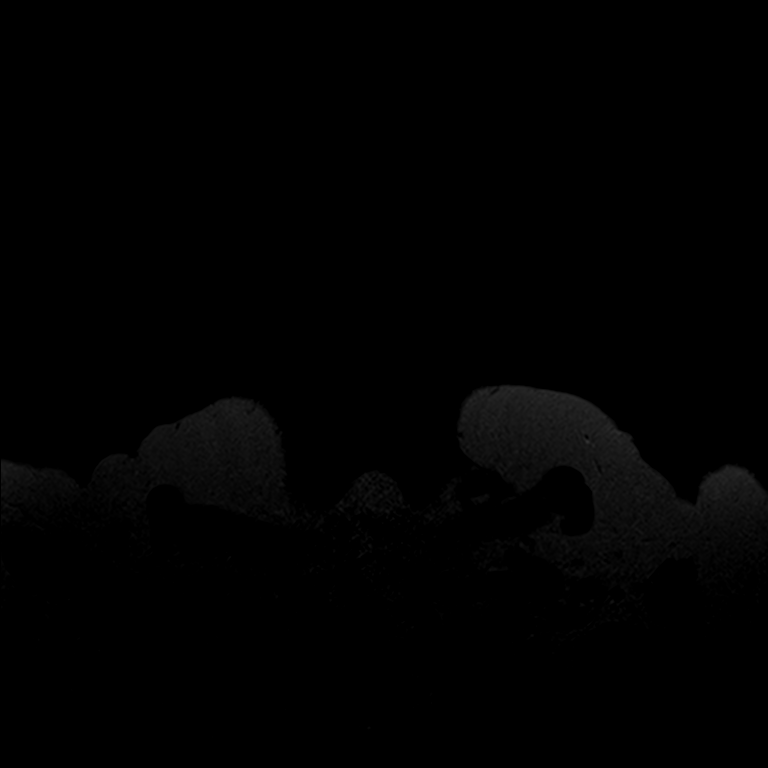

[Series 6: fl3d pre-cm · axial · non-contrast · 1.2mm · 0.94mm/px · z∈[-101,+70]mm · 5 of 144 slices shown]
[im 1/144]
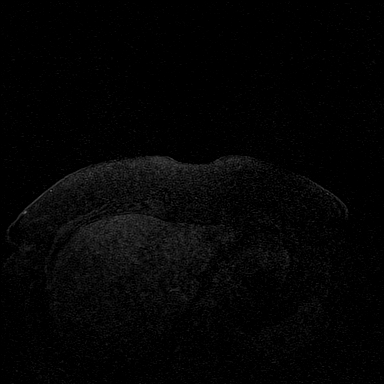
[im 36/144]
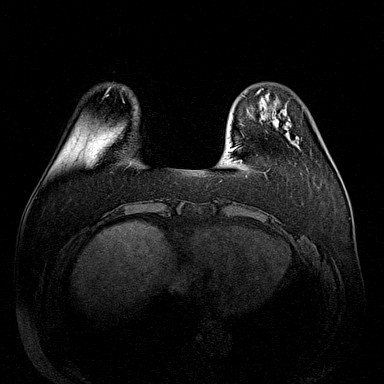
[im 72/144]
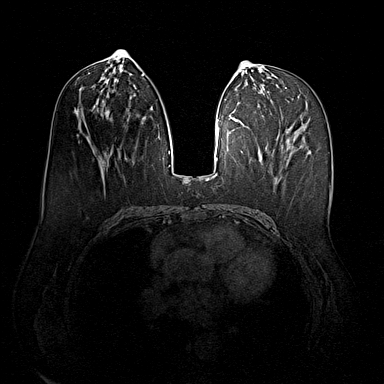
[im 108/144]
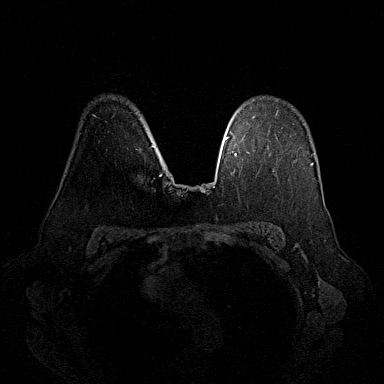
[im 144/144]
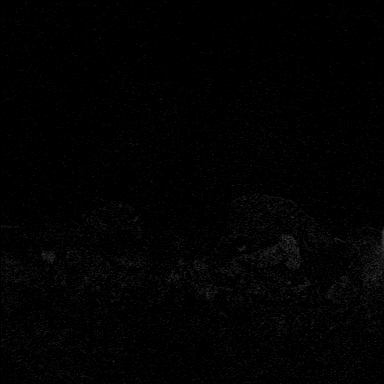

[Series 7: fl3d post immediate · axial · 1.2mm · 0.94mm/px · z∈[-101,+70]mm · 5 of 144 slices shown]
[im 1/144]
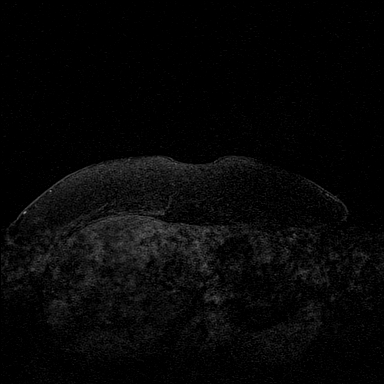
[im 36/144]
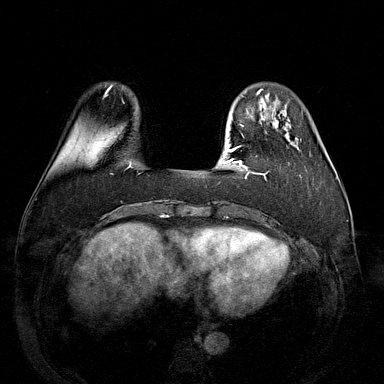
[im 72/144]
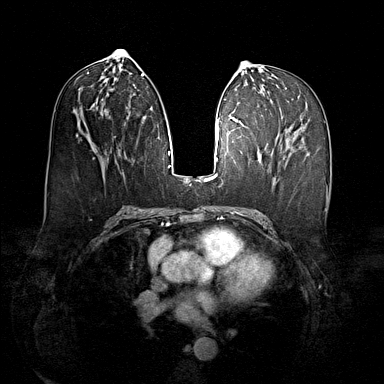
[im 108/144]
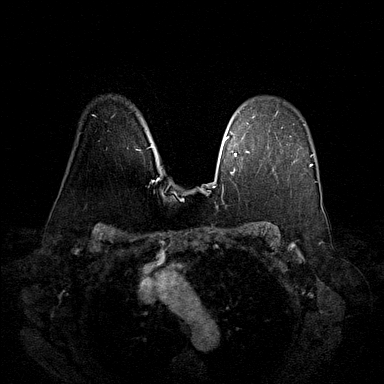
[im 144/144]
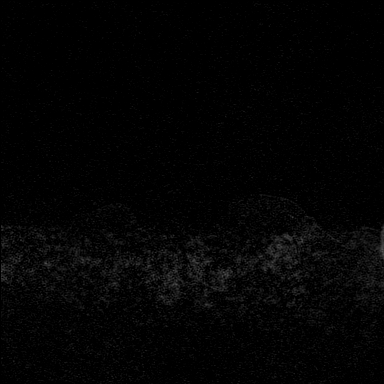

[22 of 48 positions shown; findings below may reference images not displayed]

THREE-DIMENSIONAL MR IMAGE RENDERING ON INDEPENDENT WORKSTATION:

Three-dimensional MR images were rendered by post-processing of the
original MR data on an independent workstation. The
three-dimensional MR images were interpreted, and findings are
reported in the following complete MRI report for this study. Three
dimensional images were evaluated at the independent [employer]
workstation
FINDINGS: Breast composition: c.  Heterogeneous fibroglandular tissue.

Background parenchymal enhancement: Mild

Right breast: The previously seen enhancing irregular right breast
mass located within the upper inner quadrant has decreased in size
and there is markedly decreased enhancement associated with the mass
post chemotherapy. On the non subtracted T1 images the mass now
measures 2.2 x 1.9 x 1.1 cm in size and previously the mass measured
3.6 x 3.3 x 2.8 cm in size. There are no new enhancing lesions.

Left breast: The previously seen enhancement associated with the
biopsy proven left breast DCIS has resolved. There are no new areas
of enhancement.

Lymph nodes: No abnormal appearing lymph nodes.

Ancillary findings:  None.
IMPRESSION: Decrease in size and enhancement of the mass (invasive mammary
carcinoma) located within the upper inner quadrant of the right
breast as discussed above. Also, resolution of previously seen
enhancement located within the left breast at the 1 o'clock position
(DCIS). No evidence for adenopathy and no additional findings.

RECOMMENDATION:
Treatment plan

BI-RADS CATEGORY  6: Known biopsy-proven malignancy.

## 2016-01-18 ENCOUNTER — Ambulatory Visit (HOSPITAL_COMMUNITY)
Admission: RE | Admit: 2016-01-18 | Discharge: 2016-01-18 | Disposition: A | Discharge status: Home / Self Care | Payer: Medicare Other | Source: Ambulatory Visit | Attending: Hematology | Admitting: Hematology

## 2016-01-18 DIAGNOSIS — M25512 Pain in left shoulder: Secondary | ICD-10-CM | POA: Diagnosis not present

## 2016-01-18 DIAGNOSIS — I700 Atherosclerosis of aorta: Secondary | ICD-10-CM | POA: Insufficient documentation

## 2016-01-18 DIAGNOSIS — I2510 Atherosclerotic heart disease of native coronary artery without angina pectoris: Secondary | ICD-10-CM | POA: Diagnosis not present

## 2016-01-18 DIAGNOSIS — C50211 Malignant neoplasm of upper-inner quadrant of right female breast: Secondary | ICD-10-CM | POA: Insufficient documentation

## 2016-01-18 DIAGNOSIS — C50919 Malignant neoplasm of unspecified site of unspecified female breast: Secondary | ICD-10-CM | POA: Diagnosis not present

## 2016-01-18 LAB — GLUCOSE, CAPILLARY: Glucose-Capillary: 106 mg/dL — ABNORMAL HIGH (ref 65–99)

## 2016-01-18 MED ORDER — FLUDEOXYGLUCOSE F - 18 (FDG) INJECTION
7.9600 | Freq: Once | INTRAVENOUS | Status: AC | PRN
  Administered 2016-01-18: 7.96 via INTRAVENOUS

## 2016-01-21 DIAGNOSIS — G629 Polyneuropathy, unspecified: Secondary | ICD-10-CM | POA: Diagnosis not present

## 2016-01-21 DIAGNOSIS — G40209 Localization-related (focal) (partial) symptomatic epilepsy and epileptic syndromes with complex partial seizures, not intractable, without status epilepticus: Secondary | ICD-10-CM | POA: Diagnosis not present

## 2016-01-21 DIAGNOSIS — G5600 Carpal tunnel syndrome, unspecified upper limb: Secondary | ICD-10-CM | POA: Diagnosis not present

## 2016-01-21 DIAGNOSIS — M4712 Other spondylosis with myelopathy, cervical region: Secondary | ICD-10-CM | POA: Diagnosis not present

## 2016-01-22 ENCOUNTER — Telehealth: Payer: Self-pay | Admitting: Hematology

## 2016-01-22 ENCOUNTER — Ambulatory Visit (HOSPITAL_BASED_OUTPATIENT_CLINIC_OR_DEPARTMENT_OTHER): Payer: Medicare Other | Admitting: Hematology

## 2016-01-22 ENCOUNTER — Encounter: Payer: Self-pay | Admitting: Hematology

## 2016-01-22 VITALS — BP 132/67 | HR 82 | Temp 97.8°F | Resp 18 | Ht 66.0 in | Wt 166.7 lb

## 2016-01-22 DIAGNOSIS — C50211 Malignant neoplasm of upper-inner quadrant of right female breast: Secondary | ICD-10-CM | POA: Diagnosis not present

## 2016-01-22 DIAGNOSIS — Z170 Estrogen receptor positive status [ER+]: Secondary | ICD-10-CM

## 2016-01-22 DIAGNOSIS — D0512 Intraductal carcinoma in situ of left breast: Secondary | ICD-10-CM | POA: Diagnosis not present

## 2016-01-22 NOTE — Progress Notes (Signed)
Kristin Pennington  [phone] [phone]  Clinic Follow Up Note   Patient Care Team: Lonzo Cloud, MD as PCP - General (General Practice) Lonzo Cloud, MD (General Practice) Fanny Skates, MD as Consulting Physician (General Surgery) Truitt Merle, MD as Consulting Physician (Hematology) Thea Silversmith, MD as Consulting Physician (Radiation Oncology) Rockwell Germany, RN as Registered Nurse Mauro Kaufmann, RN as Registered Nurse Holley Bouche, NP as Nurse Practitioner (Nurse Practitioner) Bary Leriche, MD as Referring Physician (Psychiatry) Erline Levine, MD as Consulting Physician (Neurosurgery) 01/22/2016  CHIEF COMPLAINTS:  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 Ultrasound of the right axilla is negative      06/05/2014 Initial Biopsy    Right breast core needle bx: invasive mammary carcinoma, ER- (0%), PR- (0%), HER2/neu negative, Ki67: 98%      07/02/2014 PET scan    1. Intensely hypermetabolic mass in the medial RIGHT chest wall with hypermetabolic right axillary node 2. Hypermetabolic nodal metastasis in the LEFT level 2 and LEFT supraclavicular and periportal node (SUV 5).       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      07/28/2014 Pathology Results    10R and 10L lymph node biopsy showed no malignant cells, mixed lymphoid population.      08/11/2014 Miscellaneous    nadir CBC: WBC 0.54, hemoglobin 8.9, hematocrit 26%, platelet 125      01/12/2015 Procedure    Breast/Ovarian ([facility]) panel: no clinically sig variant at ATM, BARD1, BRCA1, BRCA2, BRIP1, CDH1, CHEK2, EPCAM, FANCC, MLH1, MSH2, MSH6, NBN, PALB2, PMS2, PTEN, RAD51C, RAD51D, TP53, and XRCC2      01/15/2015 Surgery    Bilateral lumpectomy and right sentinel lymph node biopsy.      01/15/2015 Pathology Results    Bilateral breast lumpectomy showed fibrocystic change, treatment-related changes, no residual malignancy. 3 right axillary sentinel lymph nodes were negative.      02/24/2015 - 04/14/2015 Radiation Therapy    Right breast 45 Gy over 25 fractions; right supraclav fossa/axilla: 45 Gy over 25 fractions; right breast boost: 16 Gy over 8 fractions.  Total dose RIGHT: 61 Gy  Left breast: 50.4 Gy over 28 fractions.      04/2015 -  Anti-estrogen oral therapy    Tamoxifen 20 mg daily      07/02/2015 Survivorship    Survivorship  visit completed       HISTORY OF PRESENTING ILLNESS:  Kristin Pennington 70 y.o. female is here because of recently diagnosed poorly differentiated carcinoma in her right breast.  She found a lump in her right breast 3 weeks ago, mild tenderness,  No other complains. Last mammo was in oct 2015 which was negative. She feels well overall. She denies any pain, cough, shortness breast, GI symptoms. She has good energy level and appetite, no recent weight loss.  Her last colonoscopy was for 5 years ago which was negative per patient. Her last Pap smear was 3 years ago which was negative per patient.  CURRERNT  THERAPY:  Tamoxifen 20 mg once daily, started on Feb 2017  INTERIM HISTORY: Kristin Pennington returns for follow-up. She  Is accompanied by her niece to the clinic today. Her left shoulder pain has improved since the injection by her orthopedic surgeon. She has developed worsening left knee pain about a month ago, she did have mild arthralgia in the left are in the past, but it is much more noticeable daily, and she limps. 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 Lorenza Chick yesterday. She has mild-to-moderate fatigue, no other new pain, dyspnea, abdominal discomfort or other symptoms.   MEDICAL HISTORY:  Past Medical History:  Diagnosis Date  . Arthritis    knee  . Breast cancer ([place])    bilateral, right breast mass, cancer both breast, x1 chemo  a week ago.  Marland Kitchen GERD (gastroesophageal reflux disease)    with chemo  . Glaucoma    "early" - eye doctor is watching  . Pneumonia    2015  . 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 Wears glasses   . Wears partial dentures    top     SURGICAL HISTORY: Past Surgical History:  Procedure Laterality Date  . ABDOMINAL HYSTERECTOMY    . APPENDECTOMY    . BREAST LUMPECTOMY WITH RADIOACTIVE SEED AND SENTINEL LYMPH NODE BIOPSY Right 01/15/2015   Procedure: RIGHT BREAST LUMPECTOMY WITH RADIOACTIVE SEED AND SENTINEL LYMPH NODE BIOPSY;  Surgeon: Fanny Skates, MD;  Location: [facility];  Service: General;  Laterality: Right;  . BREAST LUMPECTOMY WITH RADIOACTIVE SEED LOCALIZATION Left 01/15/2015   Procedure: LEFT BREAST LUMPECTOMY WITH RADIOACTIVE SEED LOCALIZATION;  Surgeon: Fanny Skates, MD;  Location: [facility];  Service: General;  Laterality: Left;  . CARPAL TUNNEL RELEASE Left   . CHOLECYSTECTOMY    . COLONOSCOPY    . ENDOBRONCHIAL ULTRASOUND Bilateral 07/28/2014    Procedure: ENDOBRONCHIAL ULTRASOUND;  Surgeon: Collene Gobble, MD;  Location: [facility];  Service: Cardiopulmonary;  Laterality: Bilateral;  . PORT-A-CATH REMOVAL Left 01/15/2015   Procedure: REMOVAL PORT-A-CATH;  Surgeon: Fanny Skates, MD;  Location: [facility];  Service: General;  Laterality: Left;  . PORTACATH PLACEMENT N/A 06/30/2014   Procedure: INSERTION PORT-A-CATH WITH ULTRA SOUND ON STANDBY;  Surgeon: Fanny Skates, MD;  Location: [facility];  Service: General;  Laterality: N/A;  . TONSILLECTOMY     SOCIAL HISTORY: Social History   Social History  . Marital status: Married    Spouse name: N/A  . Number of children: N/A  . Years of education: N/A   Occupational History  . Not on file.   Social History Main Topics  . Smoking status: Former Smoker  Packs/day: 1.00    Years: 18.00    Types: Cigarettes    Quit date: 11/08/1988  . Smokeless tobacco: Never Used  . Alcohol use No  . Drug use: No  . Sexual activity: Not on file   Other Topics Concern  . Not on file   Social History Narrative  . No narrative on file     GYN HISTORY  Menarchal: 11 LMP: 1994, hysrectomy  Contraceptive: no  HRT: since 1994  G2P1, one miscarriage     FAMILY HISTORY: Family History  Problem Relation Age of Onset  . Stomach cancer Father 85  . Lung cancer Sister 32    not sure if primary or breast cancer met  . Breast cancer Maternal Aunt 72  . Breast cancer Cousin 66  . Heart Problems Paternal Aunt   . Congestive Heart Failure Mother     ALLERGIES:  is allergic to aspirin; codeine; diclofenac; meloxicam; nitrofurantoin; other; pyridium [phenazopyridine hcl]; sulfa antibiotics; tetracyclines & related; and tape.  MEDICATIONS:  Current Outpatient Prescriptions  Medication Sig Dispense Refill  . aspirin EC 81 MG tablet Take 81 mg by mouth daily.     . Calcium Carb-Cholecalciferol (CALCIUM 600+D3) 600-400 MG-UNIT TABS Take 1 tablet by mouth 2 (two) times daily.      . clotrimazole-betamethasone (LOTRISONE) cream     . cyanocobalamin (,VITAMIN B-12,) 1000 MCG/ML injection Inject 1,000 mcg into the muscle every 30 (thirty) days.     Marland Kitchen HYDROcodone-acetaminophen (NORCO) 5-325 MG tablet Take 1-2 tablets by mouth every 6 (six) hours as needed for moderate pain or severe pain. 30 tablet 0  . levETIRAcetam (KEPPRA) 500 MG tablet Take 1 tablet by mouth 2 (two) times daily.    . meloxicam (MOBIC) 15 MG tablet     . oxaprozin (DAYPRO) 600 MG tablet Take 1 tablet by mouth 2 (two) times daily. Reported on 07/27/2015    . phenytoin (DILANTIN) 100 MG ER capsule Take 1 tablet by mouth 4 (four) times daily. On  Sat , Sun, Tues, Wed, and  Thursdays    -  Take   133m  QID.    .Marland Kitchensodium chloride (OCEAN) 0.65 % SOLN nasal spray Place 1 spray into both nostrils as needed for congestion.    . tamoxifen (NOLVADEX) 20 MG tablet Take 1 tablet (20 mg total) by mouth daily. 30 tablet 11  . Tetrahydrozoline HCl ([employer] EYE DROPS OP) Apply 1 drop to eye 3 (three) times daily.     No current facility-administered medications for this visit.     REVIEW OF SYSTEMS:   Constitutional: Denies fevers, chills or abnormal night sweats Eyes: Denies blurriness of vision, double vision or watery eyes Ears, nose, mouth, throat, and face: Denies mucositis or sore throat Respiratory: Denies cough, dyspnea or wheezes Cardiovascular: Denies palpitation, chest discomfort or lower extremity swelling Gastrointestinal:  Denies nausea, heartburn or change in bowel habits Skin: Denies abnormal skin rashes Lymphatics: Denies new lymphadenopathy or easy bruising Neurological:Denies numbness, tingling or new weaknesses Behavioral/Psych: Mood is stable, no new changes  All other systems were reviewed with the patient and are negative.  PHYSICAL EXAMINATION: ECOG PERFORMANCE STATUS: 1  BP 132/67 (BP Location: Left Arm, Patient Position: Sitting)   Pulse 82   Temp 97.8 F (36.6 C) (Oral)   Resp 18   Ht 5'  6" (1.676 m)   Wt 166 lb 11.2 oz (75.6 kg)   SpO2 96%   BMI 26.91 kg/m   GENERAL:alert, no distress  and comfortable SKIN: skin color, texture, turgor are normal, no rashes or significant lesions EYES: normal, conjunctiva are pink and non-injected, sclera clear OROPHARYNX:no exudate, no erythema and lips, buccal mucosa, and tongue normal  NECK: supple, thyroid normal size, non-tender, without nodularity LYMPH:  no palpable lymphadenopathy in the cervical, axillary or inguinal LUNGS: clear to auscultation and percussion with normal breathing effort HEART: regular rate & rhythm and no murmurs and no lower extremity edema ABDOMEN:abdomen soft, non-tender and normal bowel sounds Musculoskeletal:no cyanosis of digits and no clubbing, no significant right arm swelling PSYCH: alert & oriented x 3 with fluent speech NEURO: no focal motor/sensory deficits Breasts: Breast inspection showed them to be symmetrical with no nipple discharge.  (+) Surgical incision site in both breasts have healed well, no significant scar. are healing well, no significant skin pigmentation or edema. No other palpable mass or adenopathy.  LABORATORY DATA:  I have reviewed the data as listed CBC Latest Ref Rng & Units 11/27/2015 07/27/2015 01/08/2015  WBC 3.9 - 10.3 10e3/uL 4.1 4.4 3.4(L)  Hemoglobin 11.6 - 15.9 g/dL 13.1 12.4 11.2(L)  Hematocrit 34.8 - 46.6 % 38.6 37.5 32.6(L)  Platelets 145 - 400 10e3/uL 163 178 195    CMP Latest Ref Rng & Units 11/27/2015 07/27/2015 01/08/2015  Glucose 70 - 140 mg/dl 110 84 86  BUN 7.0 - 26.0 mg/dL 17.1 18.5 15.4  Creatinine 0.6 - 1.1 mg/dL 0.8 0.7 0.8  Sodium 136 - 145 mEq/L 141 139 141  Potassium 3.5 - 5.1 mEq/L 4.0 4.0 4.0  Chloride 96 - 112 mmol/L - - -  CO2 22 - 29 mEq/L _0 Calcium 8.4 - 10.4 mg/dL 9.0 9.1 9.0  Total Protein 6.4 - 8.3 g/dL 7.2 6.9 6.8  Total Bilirubin 0.20 - 1.20 mg/dL <0.30 <0.30 <0.30  Alkaline Phos 40 - 150 U/L 92 80 109  AST 5 - 34 U/L _1 ALT 0 - 55 U/L _2 Her outside CBC and CMP were unremarkable on 01/14/2016   PATHOLOGY REPORT 06/05/2014 Diagnosis 01/15/2015 1. Breast, lumpectomy, Left - FIBROCYSTIC CHANGE WITH ADENOSIS AND CALCIFICATIONS. - TREATMENT RELATED CHANGES. - THERE IS NO EVIDENCE OF MALIGNANCY. - SEE COMMENT. 2. Breast, lumpectomy, Right - FIBROCYSTIC CHANGES WITH ADENOSIS. - TREATMENT RELATED CHANGES. - THERE IS NO EVIDENCE OF MALIGNANCY. - SEE COMMENT. 3. Lymph node, sentinel, biopsy, right axillary #1 - THERE IS NO EVIDENCE OF CARCINOMA IN 1 OF 1 LYMPH NODE (0/1). - SEE COMMENT 4. Lymph node, sentinel, biopsy, right axillary #2 - THERE IS NO EVIDENCE OF CARCINOMA IN 1 OF 1 LYMPH NODE (0/1). - SEE COMMENT. 5. Lymph node, sentinel, biopsy, right axillary #3 - THERE IS NO EVIDENCE OF CARCINOMA IN 1 OF 1 LYMPH NODE (0/1). - SEE COMMENT.  Microscopic Comment 1. Carcinoma is not identified in the current specimen. The surgical resection margin(s) of the specimen were inked and microscopically evaluated. 2. Carcinoma is not identified in the current specimen. The surgical resection margin(s) of the specimen were inked and microscopically evaluated. 3. -5. There are no significant treatment related changes present in the histologically evaluated sections of the lymph nodes. (JBK:kh 01-19-15)  Diagnosis  07/04/2014 Breast, left, needle core biopsy, UOQ - DUCTAL CARCINOMA IN-SITU. - SEE COMMENT.  Microscopic Comment The ductal carcinoma in situ is low grade. The carcinoma cells are positive for e-cadherin and cytokeratin AE1/AE3. Smooth muscle myosin, calponin, and p63 stains highlight the presence of myoepithelial cells.  Estrogen receptor and progesterone receptor studies will be performed and the results reported separately. The results were called to [facility] on 07-08-2014. IMMUNOHISTOCHEMICAL AND MORPHOMETRIC ANALYSIS BY THE AUTOMATED CELLULAR IMAGING SYSTEM  (ACIS) Estrogen Receptor: 100%, POSITIVE, STRONG STAINING INTENSITY Progesterone Receptor: 28%, POSITIVE, WEAK STAINING INTENSITY   RADIOGRAPHIC STUDIES: I have personally reviewed the radiological images as listed and agreed with the findings in the report.  Diagnostic mammogram b/l 06/12/2015 IMPRESSION: No mammographic evidence of malignancy in either breast.  RECOMMENDATION: Diagnostic mammogram is suggested in 1 year. (Code:[id number]-01Y)  PET 01/18/2016 IMPRESSION: 1. Surgical changes in both breasts. No evidence of hypermetabolic residual or recurrent tumor. No explanation for left-sided scapular pain. 2. Low-level hypermetabolism about the right pectoralis musculature is in the region of surgical changes and favored to be treatment related. 3.  Coronary artery atherosclerosis. Aortic atherosclerosis.    ASSESSMENT & PLAN:  70 year old postmenopausal woman, with past medical history of seizure and arthritis, presented with a palpable mass in the inner upper quadrant of her right breast. Biopsy revealed poorly differentiated carcinoma, triple negative.  1. Poorly differentiated mammary carcinoma in her right breast, cT2NxM0, triple negative, ypT0N0 - she received neoadjuvant chemotherapy , with complete pathological response - she also completed adjuvant breast irradiation, and tolerated well, - we discussed the surveillance plan,  Which includes annual screening mammogram, self-exam and follow-up visit every 3-4 months for the first 2 years, and every 6 months afterwards for additional 3 years. -Her annual screening mammogram in March 2017 was normal. -Lab reviewed with her, CBC and CMP are normal, her exam was unremarkable today.  -Her recent PET scan was negative for metastatic lesions. -We'll continue surveillance.  2. Left breast DCIS,  ER+/PR+ -The left breast DCIS has no residual tumor on the surgical sample, it was ER/PR strongly positive. -she is on  chemoprevention with tamoxifen, tolerating well except mild hot flash.  3. Left shoulder blade pain -She is seeing her orthopedic surgeon Dr. 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 Vertell Limber  4. Hot flush -Secondary to tamoxifen. -Tolerable, patient previously declined medication. We'll continue monitoring.   5. Genetic -She has multiple family members had breast cancer -her genetic testing was negative for the following 20 genes: ATM, BARD1, BRCA1, BRCA2, BRIP1, CDH1, CHEK2, EPCAM, FANCC, MLH1, MSH2, MSH6, NBN, PALB2, PMS2, PTEN, RAD51C, RAD51D, TP53, and XRCC2  Plan: -PET scan reviewed, no evidence of recurrence -continue tamoxifen for breast cancer chemoprevention -I'll see her back in 3 months -I'll copy my note to Dr. Vertell Limber, Dr. Rob Hickman   All questions were answered. The patient knows to call the clinic with any problems, questions or concerns.  I spent 20 minutes counseling the patient face to face. The total time spent in the appointment was 30 minutes and more than 50% was on counseling.   We'll continue  Truitt Merle, MD 01/22/2016 9:06 AM

## 2016-01-22 NOTE — Telephone Encounter (Signed)
Appointments scheduled per 01/22/16 los. AVS report and appointment schedule given to patient, per 01/22/16 los. °

## 2016-02-09 DIAGNOSIS — M818 Other osteoporosis without current pathological fracture: Secondary | ICD-10-CM | POA: Diagnosis not present

## 2016-02-22 DIAGNOSIS — M8580 Other specified disorders of bone density and structure, unspecified site: Secondary | ICD-10-CM | POA: Diagnosis not present

## 2016-02-22 DIAGNOSIS — M5090 Cervical disc disorder, unspecified, unspecified cervical region: Secondary | ICD-10-CM | POA: Diagnosis not present

## 2016-02-24 DIAGNOSIS — M5412 Radiculopathy, cervical region: Secondary | ICD-10-CM | POA: Diagnosis not present

## 2016-02-24 DIAGNOSIS — M5136 Other intervertebral disc degeneration, lumbar region: Secondary | ICD-10-CM | POA: Diagnosis not present

## 2016-02-24 DIAGNOSIS — M545 Low back pain: Secondary | ICD-10-CM | POA: Diagnosis not present

## 2016-02-24 DIAGNOSIS — Z6828 Body mass index (BMI) 28.0-28.9, adult: Secondary | ICD-10-CM | POA: Diagnosis not present

## 2016-02-24 DIAGNOSIS — M4126 Other idiopathic scoliosis, lumbar region: Secondary | ICD-10-CM | POA: Diagnosis not present

## 2016-02-24 DIAGNOSIS — M542 Cervicalgia: Secondary | ICD-10-CM | POA: Diagnosis not present

## 2016-02-24 DIAGNOSIS — M25559 Pain in unspecified hip: Secondary | ICD-10-CM | POA: Diagnosis not present

## 2016-02-25 DIAGNOSIS — J019 Acute sinusitis, unspecified: Secondary | ICD-10-CM | POA: Diagnosis not present

## 2016-03-14 DIAGNOSIS — M542 Cervicalgia: Secondary | ICD-10-CM | POA: Diagnosis not present

## 2016-03-17 DIAGNOSIS — M542 Cervicalgia: Secondary | ICD-10-CM | POA: Diagnosis not present

## 2016-03-23 DIAGNOSIS — M542 Cervicalgia: Secondary | ICD-10-CM | POA: Diagnosis not present

## 2016-03-25 DIAGNOSIS — M5136 Other intervertebral disc degeneration, lumbar region: Secondary | ICD-10-CM | POA: Diagnosis not present

## 2016-03-25 DIAGNOSIS — M5090 Cervical disc disorder, unspecified, unspecified cervical region: Secondary | ICD-10-CM | POA: Diagnosis not present

## 2016-03-29 DIAGNOSIS — M542 Cervicalgia: Secondary | ICD-10-CM | POA: Diagnosis not present

## 2016-03-31 DIAGNOSIS — M542 Cervicalgia: Secondary | ICD-10-CM | POA: Diagnosis not present

## 2016-03-31 DIAGNOSIS — Z23 Encounter for immunization: Secondary | ICD-10-CM | POA: Diagnosis not present

## 2016-03-31 DIAGNOSIS — S61112A Laceration without foreign body of left thumb with damage to nail, initial encounter: Secondary | ICD-10-CM | POA: Diagnosis not present

## 2016-03-31 DIAGNOSIS — W268XXA Contact with other sharp object(s), not elsewhere classified, initial encounter: Secondary | ICD-10-CM | POA: Diagnosis not present

## 2016-04-05 DIAGNOSIS — M542 Cervicalgia: Secondary | ICD-10-CM | POA: Diagnosis not present

## 2016-04-07 DIAGNOSIS — M542 Cervicalgia: Secondary | ICD-10-CM | POA: Diagnosis not present

## 2016-04-15 DIAGNOSIS — M542 Cervicalgia: Secondary | ICD-10-CM | POA: Diagnosis not present

## 2016-04-20 DIAGNOSIS — M5416 Radiculopathy, lumbar region: Secondary | ICD-10-CM | POA: Diagnosis not present

## 2016-04-20 DIAGNOSIS — M5090 Cervical disc disorder, unspecified, unspecified cervical region: Secondary | ICD-10-CM | POA: Diagnosis not present

## 2016-04-20 DIAGNOSIS — M5136 Other intervertebral disc degeneration, lumbar region: Secondary | ICD-10-CM | POA: Diagnosis not present

## 2016-04-21 DIAGNOSIS — M542 Cervicalgia: Secondary | ICD-10-CM | POA: Diagnosis not present

## 2016-04-21 NOTE — Progress Notes (Signed)
[place]  [phone] [phone]  Clinic Follow Up Note   Patient Care Team: Moshe Cipro, MD as PCP - General (Internal Medicine) Lonzo Cloud, MD (General Practice) Fanny Skates, MD as Consulting Physician (General Surgery) Truitt Merle, MD as Consulting Physician (Hematology) Thea Silversmith, MD as Consulting Physician (Radiation Oncology) Rockwell Germany, RN as Registered Nurse Mauro Kaufmann, RN as Registered Nurse Holley Bouche, NP as Nurse Practitioner (Nurse Practitioner) Bary Leriche, MD as Referring Physician (Psychiatry) Erline Levine, MD as Consulting Physician (Neurosurgery) 04/22/2016  CHIEF COMPLAINTS:  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 Ultrasound of the right axilla is negative      06/05/2014 Initial Biopsy    Right breast core needle bx: invasive mammary carcinoma, ER- (0%), PR- (0%), HER2/neu negative, Ki67: 98%      07/02/2014 PET scan    1. Intensely hypermetabolic mass in the medial RIGHT chest wall with hypermetabolic right axillary node 2. Hypermetabolic nodal metastasis in the LEFT level 2 and LEFT supraclavicular and periportal node (SUV 5).       07/04/2014 Pathology Results    Left breast mass biopsy showed DCIS, ER100%, PR 48%      07/10/2014 Pathology Results    Left [place] node biopsy was negative for malignant cells, benign reactive lymph tissue       07/10/2014  Clinical Stage    RIGHT: Stage IIA: T2 N0   LEFT:  Stage 0: Tis N0      07/18/2014 - 12/19/2014 Chemotherapy    Dose dense Adriamycin and Cytoxan, every 2 weeks, X4, followed by weekly carbo and Taxol for 12 weeks.      07/28/2014 Pathology Results    10R and 10L lymph node biopsy showed no malignant cells, mixed lymphoid population.      08/11/2014 Miscellaneous    nadir CBC: WBC 0.54, hemoglobin 8.9, hematocrit 26%, platelet 125      01/12/2015 Procedure    Breast/Ovarian ([facility]) panel: no clinically sig variant at ATM, BARD1, BRCA1, BRCA2, BRIP1, CDH1, CHEK2, EPCAM, FANCC, MLH1, MSH2, MSH6, NBN, PALB2, PMS2, PTEN, RAD51C, RAD51D, TP53, and XRCC2      01/15/2015 Surgery    Bilateral lumpectomy and right sentinel lymph node biopsy.      01/15/2015 Pathology Results    Bilateral breast lumpectomy showed fibrocystic change, treatment-related changes, no residual malignancy. 3 right axillary sentinel lymph nodes were negative.      02/24/2015 - 04/14/2015 Radiation Therapy    Right breast 45 Gy over 25 fractions; right supraclav fossa/axilla: 45 Gy over 25 fractions; right breast boost: 16 Gy over 8 fractions.  Total dose RIGHT: 61 Gy  Left breast: 50.4 Gy over 28 fractions.      04/2015 -  Anti-estrogen oral therapy    Tamoxifen 20 mg daily      07/02/2015 Survivorship    Survivorship  visit completed       HISTORY OF PRESENTING ILLNESS:  Kristin Pennington 71 y.o. female is here because of recently diagnosed poorly differentiated carcinoma in her right breast.  She found a lump in her right breast 3 weeks ago, mild tenderness,  No other complains. Last mammo was in oct 2015 which was negative. She feels well overall. She denies any pain, cough, shortness breast, GI symptoms. She has good energy level and appetite, no recent weight loss.  Her last colonoscopy was for 5 years ago which was negative per patient. Her last Pap smear was 3 years ago which was negative per  patient.  CURRERNT THERAPY:  Tamoxifen 20 mg once daily, started on Feb 2017  INTERIM HISTORY: [name] returns for follow-up. She has been doing PT lately and developed some right side low back pain, she received a shot for that. She has been seen neurosurgeon Dr. Vertell Limber. Her left shoulder blade pain has improved some. No other pain or other complains. Her appetite and energy levels are good. She intentionally lost about 7 lbs in the last 3 months. She tolerates tamoxifen very well, hot flush improved, no other side effects.    MEDICAL HISTORY:  Past Medical History:  Diagnosis Date  . Arthritis    knee  . Breast cancer ([place])    bilateral, right breast mass, cancer both breast, x1 chemo  a week ago.  Marland Kitchen GERD (gastroesophageal reflux disease)    with chemo  . Glaucoma    "early" - eye doctor is watching  . Pneumonia    2015  . 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 Wears glasses   . Wears partial dentures    top     SURGICAL HISTORY: Past Surgical History:  Procedure Laterality Date  . ABDOMINAL HYSTERECTOMY    . APPENDECTOMY    . BREAST LUMPECTOMY WITH RADIOACTIVE SEED AND SENTINEL LYMPH NODE BIOPSY Right 01/15/2015   Procedure: RIGHT BREAST LUMPECTOMY WITH RADIOACTIVE SEED AND SENTINEL LYMPH NODE BIOPSY;  Surgeon: Fanny Skates, MD;  Location: [facility];  Service: General;  Laterality: Right;  . BREAST LUMPECTOMY WITH RADIOACTIVE SEED LOCALIZATION Left 01/15/2015   Procedure: LEFT BREAST LUMPECTOMY WITH RADIOACTIVE SEED LOCALIZATION;  Surgeon: Fanny Skates, MD;  Location: [facility];  Service: General;  Laterality: Left;  . CARPAL TUNNEL RELEASE Left   . CHOLECYSTECTOMY    . COLONOSCOPY    . ENDOBRONCHIAL ULTRASOUND Bilateral 07/28/2014   Procedure: ENDOBRONCHIAL ULTRASOUND;  Surgeon: Collene Gobble, MD;  Location: [facility];  Service: Cardiopulmonary;  Laterality: Bilateral;  . PORT-A-CATH REMOVAL Left 01/15/2015   Procedure: REMOVAL PORT-A-CATH;  Surgeon:  Fanny Skates, MD;  Location: [facility];  Service: General;  Laterality: Left;  . PORTACATH PLACEMENT N/A 06/30/2014   Procedure: INSERTION PORT-A-CATH WITH ULTRA SOUND ON STANDBY;  Surgeon: Fanny Skates, MD;  Location: [facility];  Service: General;  Laterality: N/A;  . TONSILLECTOMY     SOCIAL HISTORY: Social History   Social History  . Marital status: Married    Spouse name: N/A  . Number of children: N/A  . Years of education: N/A   Occupational History  . Not on file.   Social History Main Topics  . Smoking status: Former Smoker    Packs/day: 1.00    Years: 18.00    Types: Cigarettes    Quit date: 11/08/1988  . Smokeless tobacco: Never Used  . Alcohol use No  . Drug use: No  .  Sexual activity: Not on file   Other Topics Concern  . Not on file   Social History Narrative  . No narrative on file     GYN HISTORY  Menarchal: 11 LMP: 1994, hysrectomy  Contraceptive: no  HRT: since 1994  G2P1, one miscarriage     FAMILY HISTORY: Family History  Problem Relation Age of Onset  . Stomach cancer Father 44  . Lung cancer Sister 51    not sure if primary or breast cancer met  . Breast cancer Maternal Aunt 72  . Breast cancer Cousin 54  . Heart Problems Paternal Aunt   . Congestive Heart Failure Mother     ALLERGIES:  is allergic to aspirin; codeine; diclofenac; meloxicam; nitrofurantoin; other; pyridium [phenazopyridine hcl]; sulfa antibiotics; tetracyclines & related; and tape.  MEDICATIONS:  Current Outpatient Prescriptions  Medication Sig Dispense Refill  . aspirin EC 81 MG tablet Take 81 mg by mouth daily.     . Calcium Carb-Cholecalciferol (CALCIUM 600+D3) 600-400 MG-UNIT TABS Take 1 tablet by mouth 2 (two) times daily.    . clotrimazole-betamethasone (LOTRISONE) cream     . cyanocobalamin (,VITAMIN B-12,) 1000 MCG/ML injection Inject 1,000 mcg into the muscle every 30 (thirty) days.     . diclofenac (VOLTAREN) 50 MG EC tablet Take 50 mg by  mouth 2 (two) times daily.    Marland Kitchen HYDROcodone-acetaminophen (NORCO) 5-325 MG tablet Take 1-2 tablets by mouth every 6 (six) hours as needed for moderate pain or severe pain. 30 tablet 0  . levETIRAcetam (KEPPRA) 500 MG tablet Take 1 tablet by mouth 2 (two) times daily.    Marland Kitchen oxaprozin (DAYPRO) 600 MG tablet Take 1 tablet by mouth 2 (two) times daily. Reported on 07/27/2015    . phenytoin (DILANTIN) 100 MG ER capsule Take 1 tablet by mouth 4 (four) times daily. On  Sat , Sun, Tues, Wed, and  Thursdays    -  Take   159m  QID.    .Marland Kitchensodium chloride (OCEAN) 0.65 % SOLN nasal spray Place 1 spray into both nostrils as needed for congestion.    . tamoxifen (NOLVADEX) 20 MG tablet Take 1 tablet (20 mg total) by mouth daily. 30 tablet 11  . Tetrahydrozoline HCl ([employer] EYE DROPS OP) Apply 1 drop to eye 3 (three) times daily.     No current facility-administered medications for this visit.     REVIEW OF SYSTEMS:   Constitutional: Denies fevers, chills or abnormal night sweats Eyes: Denies blurriness of vision, double vision or watery eyes Ears, nose, mouth, throat, and face: Denies mucositis or sore throat Respiratory: Denies cough, dyspnea or wheezes Cardiovascular: Denies palpitation, chest discomfort or lower extremity swelling Gastrointestinal:  Denies nausea, heartburn or change in bowel habits Skin: Denies abnormal skin rashes Lymphatics: Denies new lymphadenopathy or easy bruising Neurological:Denies numbness, tingling or new weaknesses Behavioral/Psych: Mood is stable, no new changes  All other systems were reviewed with the patient and are negative.  PHYSICAL EXAMINATION: ECOG PERFORMANCE STATUS: 1  BP 125/69 (BP Location: Left Arm, Patient Position: Sitting)   Pulse 64   Temp 98.2 F (36.8 C) (Oral)   Resp 17   Ht '5\' 6"'  (1.676 m)   Wt 159 lb (72.1 kg)   SpO2 98%   BMI 25.66 kg/m   GENERAL:alert, no distress and comfortable SKIN: skin color, texture, turgor are normal, no rashes or  significant lesions EYES: normal, conjunctiva are pink and non-injected, sclera clear OROPHARYNX:no exudate, no erythema and lips, buccal  mucosa, and tongue normal  NECK: supple, thyroid normal size, non-tender, without nodularity LYMPH:  no palpable lymphadenopathy in the cervical, axillary or inguinal LUNGS: clear to auscultation and percussion with normal breathing effort HEART: regular rate & rhythm and no murmurs and no lower extremity edema ABDOMEN:abdomen soft, non-tender and normal bowel sounds Musculoskeletal:no cyanosis of digits and no clubbing, no significant right arm swelling PSYCH: alert & oriented x 3 with fluent speech NEURO: no focal motor/sensory deficits Breasts: Breast inspection showed them to be symmetrical with no nipple discharge.  (+) Surgical incision site in both breasts have healed well, no significant scar. are healing well, no significant skin pigmentation or edema. No other palpable mass or adenopathy.  LABORATORY DATA:  I have reviewed the data as listed CBC Latest Ref Rng & Units 04/22/2016 11/27/2015 07/27/2015  WBC 3.9 - 10.3 10e3/uL 3.9 4.1 4.4  Hemoglobin 11.6 - 15.9 g/dL 13.0 13.1 12.4  Hematocrit 34.8 - 46.6 % 38.7 38.6 37.5  Platelets 145 - 400 10e3/uL 276 163 178    CMP Latest Ref Rng & Units 04/22/2016 11/27/2015 07/27/2015  Glucose 70 - 140 mg/dl 85 110 84  BUN 7.0 - 26.0 mg/dL 21.7 17.1 18.5  Creatinine 0.6 - 1.1 mg/dL 0.8 0.8 0.7  Sodium 136 - 145 mEq/L 140 141 139  Potassium 3.5 - 5.1 mEq/L 4.0 4.0 4.0  Chloride 96 - 112 mmol/L - - -  CO2 22 - 29 mEq/L '27 24 27  ' Calcium 8.4 - 10.4 mg/dL 9.4 9.0 9.1  Total Protein 6.4 - 8.3 g/dL 7.4 7.2 6.9  Total Bilirubin 0.20 - 1.20 mg/dL <0.22 <0.30 <0.30  Alkaline Phos 40 - 150 U/L 78 92 80  AST 5 - 34 U/L '15 14 15  ' ALT 0 - 55 U/L '12 10 12    ' Her outside CBC and CMP were unremarkable on 01/14/2016   PATHOLOGY REPORT 06/05/2014 Diagnosis 01/15/2015 1. Breast, lumpectomy, Left - FIBROCYSTIC CHANGE WITH  ADENOSIS AND CALCIFICATIONS. - TREATMENT RELATED CHANGES. - THERE IS NO EVIDENCE OF MALIGNANCY. - SEE COMMENT. 2. Breast, lumpectomy, Right - FIBROCYSTIC CHANGES WITH ADENOSIS. - TREATMENT RELATED CHANGES. - THERE IS NO EVIDENCE OF MALIGNANCY. - SEE COMMENT. 3. Lymph node, sentinel, biopsy, right axillary #1 - THERE IS NO EVIDENCE OF CARCINOMA IN 1 OF 1 LYMPH NODE (0/1). - SEE COMMENT 4. Lymph node, sentinel, biopsy, right axillary #2 - THERE IS NO EVIDENCE OF CARCINOMA IN 1 OF 1 LYMPH NODE (0/1). - SEE COMMENT. 5. Lymph node, sentinel, biopsy, right axillary #3 - THERE IS NO EVIDENCE OF CARCINOMA IN 1 OF 1 LYMPH NODE (0/1). - SEE COMMENT.  Microscopic Comment 1. Carcinoma is not identified in the current specimen. The surgical resection margin(s) of the specimen were inked and microscopically evaluated. 2. Carcinoma is not identified in the current specimen. The surgical resection margin(s) of the specimen were inked and microscopically evaluated. 3. -5. There are no significant treatment related changes present in the histologically evaluated sections of the lymph nodes. (JBK:kh 01-19-15)  Diagnosis  07/04/2014 Breast, left, needle core biopsy, UOQ - DUCTAL CARCINOMA IN-SITU. - SEE COMMENT.  Microscopic Comment The ductal carcinoma in situ is low grade. The carcinoma cells are positive for e-cadherin and cytokeratin AE1/AE3. Smooth muscle myosin, calponin, and p63 stains highlight the presence of myoepithelial cells. Estrogen receptor and progesterone receptor studies will be performed and the results reported separately. The results were called to [facility] on 07-08-2014. IMMUNOHISTOCHEMICAL AND MORPHOMETRIC ANALYSIS BY  THE AUTOMATED CELLULAR IMAGING SYSTEM (ACIS) Estrogen Receptor: 100%, POSITIVE, STRONG STAINING INTENSITY Progesterone Receptor: 28%, POSITIVE, WEAK STAINING INTENSITY   RADIOGRAPHIC STUDIES: I have personally reviewed the radiological  images as listed and agreed with the findings in the report.  Diagnostic mammogram b/l 06/12/2015 IMPRESSION: No mammographic evidence of malignancy in either breast.  RECOMMENDATION: Diagnostic mammogram is suggested in 1 year. (Code:[id number]-B-01Y)  PET 01/18/2016 IMPRESSION: 1. Surgical changes in both breasts. No evidence of hypermetabolic residual or recurrent tumor. No explanation for left-sided scapular pain. 2. Low-level hypermetabolism about the right pectoralis musculature is in the region of surgical changes and favored to be treatment related. 3.  Coronary artery atherosclerosis. Aortic atherosclerosis.    ASSESSMENT & PLAN:  71 y.o. postmenopausal woman, with past medical history of seizure and arthritis, presented with a palpable mass in the inner upper quadrant of her right breast. Biopsy revealed poorly differentiated carcinoma, triple negative.  1. Poorly differentiated mammary carcinoma in her right breast, cT2NxM0, triple negative, ypT0N0 - she received neoadjuvant chemotherapy , with complete pathological response - she also completed adjuvant breast irradiation, and tolerated well, - we discussed the surveillance plan,  Which includes annual screening mammogram, self-exam and follow-up visit every 3-4 months for the first 2 years, and every 6 months afterwards for additional 3 years. -Her annual screening mammogram in March 2017 was normal. -Lab reviewed with her, CBC and CMP are normal, her exam was unremarkable today.  -Her last PET scan in 12/2015 was negative for metastatic lesions, I don't think she needs more surveillance CT or PET scan. -We'll continue surveillance.  2. Left breast DCIS,  ER+/PR+ -The left breast DCIS has no residual tumor on the surgical sample, it was ER/PR strongly positive. -she is on chemoprevention with tamoxifen, tolerating well except mild hot flash.  3. Cervical spine stenosis, left shoulder blade pain -She is seeing her  orthopedic surgeon Dr. Megan Salon and neurosurgeon Dr. 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 Vertell Limber  4. Hot flush -Secondary to tamoxifen. -Tolerable, patient previously declined medication. We'll continue monitoring.   5. Genetic -She has multiple family members had breast cancer -her genetic testing was negative for the following 20 genes: ATM, BARD1, BRCA1, BRCA2, BRIP1, CDH1, CHEK2, EPCAM, FANCC, MLH1, MSH2, MSH6, NBN, PALB2, PMS2, PTEN, RAD51C, RAD51D, TP53, and XRCC2  Plan: -continue tamoxifen for breast cancer chemoprevention -She is due for mammogram in March, I ordered for her today  -I'll see her back in 4 months  All questions were answered. The patient knows to call the clinic with any problems, questions or concerns.  I spent 20 minutes counseling the patient face to face. The total time spent in the appointment was 30 minutes and more than 50% was on counseling.   We'll continue  Truitt Merle, MD 04/22/2016 9:23 AM

## 2016-04-22 ENCOUNTER — Other Ambulatory Visit (HOSPITAL_BASED_OUTPATIENT_CLINIC_OR_DEPARTMENT_OTHER): Payer: Medicare Other

## 2016-04-22 ENCOUNTER — Encounter: Payer: Self-pay | Admitting: Hematology

## 2016-04-22 ENCOUNTER — Telehealth: Payer: Self-pay | Admitting: Hematology

## 2016-04-22 ENCOUNTER — Ambulatory Visit (HOSPITAL_BASED_OUTPATIENT_CLINIC_OR_DEPARTMENT_OTHER): Payer: Medicare Other | Admitting: Hematology

## 2016-04-22 VITALS — BP 125/69 | HR 64 | Temp 98.2°F | Resp 17 | Ht 66.0 in | Wt 159.0 lb

## 2016-04-22 DIAGNOSIS — D0512 Intraductal carcinoma in situ of left breast: Secondary | ICD-10-CM | POA: Diagnosis not present

## 2016-04-22 DIAGNOSIS — C50211 Malignant neoplasm of upper-inner quadrant of right female breast: Secondary | ICD-10-CM

## 2016-04-22 DIAGNOSIS — Z170 Estrogen receptor positive status [ER+]: Secondary | ICD-10-CM

## 2016-04-22 LAB — COMPREHENSIVE METABOLIC PANEL
ALT: 12 U/L (ref 0–55)
AST: 15 U/L (ref 5–34)
Albumin: 3.3 g/dL — ABNORMAL LOW (ref 3.5–5.0)
Alkaline Phosphatase: 78 U/L (ref 40–150)
Anion Gap: 10 mEq/L (ref 3–11)
BUN: 21.7 mg/dL (ref 7.0–26.0)
CO2: 27 mEq/L (ref 22–29)
Calcium: 9.4 mg/dL (ref 8.4–10.4)
Chloride: 102 mEq/L (ref 98–109)
Creatinine: 0.8 mg/dL (ref 0.6–1.1)
EGFR: 77 mL/min/{1.73_m2} — ABNORMAL LOW (ref >90)
Glucose: 85 mg/dl (ref 70–140)
Potassium: 4 mEq/L (ref 3.5–5.1)
Sodium: 140 mEq/L (ref 136–145)
Total Bilirubin: <0.22 mg/dL (ref 0.20–1.20)
Total Protein: 7.4 g/dL (ref 6.4–8.3)

## 2016-04-22 LAB — CBC WITH DIFFERENTIAL/PLATELET
BASO%: 1.4 % (ref 0.0–2.0)
Basophils Absolute: 0.1 10*3/uL (ref 0.0–0.1)
EOS%: 4.4 % (ref 0.0–7.0)
Eosinophils Absolute: 0.2 10*3/uL (ref 0.0–0.5)
HCT: 38.7 % (ref 34.8–46.6)
HGB: 13 g/dL (ref 11.6–15.9)
LYMPH%: 21.5 % (ref 14.0–49.7)
MCH: 31.5 pg (ref 25.1–34.0)
MCHC: 33.5 g/dL (ref 31.5–36.0)
MCV: 93.9 fL (ref 79.5–101.0)
MONO#: 0.5 10*3/uL (ref 0.1–0.9)
MONO%: 11.6 % (ref 0.0–14.0)
NEUT#: 2.4 10*3/uL (ref 1.5–6.5)
NEUT%: 61.1 % (ref 38.4–76.8)
Platelets: 276 10*3/uL (ref 145–400)
RBC: 4.12 10*6/uL (ref 3.70–5.45)
RDW: 12.5 % (ref 11.2–14.5)
WBC: 3.9 10*3/uL (ref 3.9–10.3)
lymph#: 0.8 10*3/uL — ABNORMAL LOW (ref 0.9–3.3)

## 2016-04-22 MED ORDER — TAMOXIFEN CITRATE 20 MG PO TABS: 20.0000 mg | ORAL_TABLET | Freq: Every day | ORAL | 11 refills | Status: DC

## 2016-04-22 NOTE — Telephone Encounter (Signed)
Appointments scheduled per 12/6 LOS. Patient given AVS report and calendars with future scheduled appointments. °

## 2016-04-26 DIAGNOSIS — M542 Cervicalgia: Secondary | ICD-10-CM | POA: Diagnosis not present

## 2016-04-27 DIAGNOSIS — M4126 Other idiopathic scoliosis, lumbar region: Secondary | ICD-10-CM | POA: Diagnosis not present

## 2016-04-27 DIAGNOSIS — M5136 Other intervertebral disc degeneration, lumbar region: Secondary | ICD-10-CM | POA: Diagnosis not present

## 2016-04-27 DIAGNOSIS — M542 Cervicalgia: Secondary | ICD-10-CM | POA: Diagnosis not present

## 2016-04-27 DIAGNOSIS — M5412 Radiculopathy, cervical region: Secondary | ICD-10-CM | POA: Diagnosis not present

## 2016-04-27 DIAGNOSIS — M545 Low back pain: Secondary | ICD-10-CM | POA: Diagnosis not present

## 2016-04-29 DIAGNOSIS — M542 Cervicalgia: Secondary | ICD-10-CM | POA: Diagnosis not present

## 2016-05-03 DIAGNOSIS — M542 Cervicalgia: Secondary | ICD-10-CM | POA: Diagnosis not present

## 2016-05-09 DIAGNOSIS — M5136 Other intervertebral disc degeneration, lumbar region: Secondary | ICD-10-CM | POA: Diagnosis not present

## 2016-05-09 DIAGNOSIS — M5416 Radiculopathy, lumbar region: Secondary | ICD-10-CM | POA: Diagnosis not present

## 2016-05-09 DIAGNOSIS — M5090 Cervical disc disorder, unspecified, unspecified cervical region: Secondary | ICD-10-CM | POA: Diagnosis not present

## 2016-05-10 DIAGNOSIS — M542 Cervicalgia: Secondary | ICD-10-CM | POA: Diagnosis not present

## 2016-05-13 DIAGNOSIS — M542 Cervicalgia: Secondary | ICD-10-CM | POA: Diagnosis not present

## 2016-05-17 DIAGNOSIS — M542 Cervicalgia: Secondary | ICD-10-CM | POA: Diagnosis not present

## 2016-05-23 DIAGNOSIS — M542 Cervicalgia: Secondary | ICD-10-CM | POA: Diagnosis not present

## 2016-06-02 DIAGNOSIS — Z7982 Long term (current) use of aspirin: Secondary | ICD-10-CM | POA: Diagnosis not present

## 2016-06-02 DIAGNOSIS — R6883 Chills (without fever): Secondary | ICD-10-CM | POA: Diagnosis not present

## 2016-06-02 DIAGNOSIS — K529 Noninfective gastroenteritis and colitis, unspecified: Secondary | ICD-10-CM | POA: Diagnosis not present

## 2016-06-02 DIAGNOSIS — K921 Melena: Secondary | ICD-10-CM | POA: Diagnosis not present

## 2016-06-02 DIAGNOSIS — J111 Influenza due to unidentified influenza virus with other respiratory manifestations: Secondary | ICD-10-CM | POA: Diagnosis not present

## 2016-06-02 DIAGNOSIS — Z882 Allergy status to sulfonamides status: Secondary | ICD-10-CM | POA: Diagnosis not present

## 2016-06-02 DIAGNOSIS — R102 Pelvic and perineal pain: Secondary | ICD-10-CM | POA: Diagnosis not present

## 2016-06-02 DIAGNOSIS — D72829 Elevated white blood cell count, unspecified: Secondary | ICD-10-CM | POA: Diagnosis not present

## 2016-06-03 ENCOUNTER — Telehealth: Payer: Self-pay | Admitting: *Deleted

## 2016-06-03 NOTE — Telephone Encounter (Signed)
Received vm call from pt stating that she was diagnosed with colitis, flu, & something else yest & is on medications.  She reports passing blood through stools & wants Dr Burr Medico to know that her new PCP is Dr Landry Corporal @ [facility] in [place], [place].  Message to Dr Burr Medico.

## 2016-06-06 ENCOUNTER — Telehealth: Payer: Self-pay | Admitting: Hematology

## 2016-06-06 NOTE — Telephone Encounter (Signed)
I called her back today, she has chills and bloody stool last week, was seen by PCP, and was treated for colitis and flu. She feels better overall, and will follow up with her PCP Dr. Deatra Ina. I do not think her symptoms related to her history of breast cancer or tamoxifen, she will let us know if has any further questions in future.   Truitt Merle  06/06/2016

## 2016-06-13 DIAGNOSIS — C50919 Malignant neoplasm of unspecified site of unspecified female breast: Secondary | ICD-10-CM | POA: Diagnosis not present

## 2016-06-13 DIAGNOSIS — M25429 Effusion, unspecified elbow: Secondary | ICD-10-CM | POA: Diagnosis not present

## 2016-06-13 DIAGNOSIS — M810 Age-related osteoporosis without current pathological fracture: Secondary | ICD-10-CM | POA: Diagnosis not present

## 2016-06-13 DIAGNOSIS — M542 Cervicalgia: Secondary | ICD-10-CM | POA: Diagnosis not present

## 2016-06-13 DIAGNOSIS — R569 Unspecified convulsions: Secondary | ICD-10-CM | POA: Diagnosis not present

## 2016-06-13 DIAGNOSIS — M5416 Radiculopathy, lumbar region: Secondary | ICD-10-CM | POA: Diagnosis not present

## 2016-06-13 DIAGNOSIS — Z8249 Family history of ischemic heart disease and other diseases of the circulatory system: Secondary | ICD-10-CM | POA: Diagnosis not present

## 2016-06-13 DIAGNOSIS — I440 Atrioventricular block, first degree: Secondary | ICD-10-CM | POA: Diagnosis not present

## 2016-06-13 DIAGNOSIS — G5600 Carpal tunnel syndrome, unspecified upper limb: Secondary | ICD-10-CM | POA: Diagnosis not present

## 2016-06-13 DIAGNOSIS — Z23 Encounter for immunization: Secondary | ICD-10-CM | POA: Diagnosis not present

## 2016-06-13 DIAGNOSIS — K529 Noninfective gastroenteritis and colitis, unspecified: Secondary | ICD-10-CM | POA: Diagnosis not present

## 2016-06-14 ENCOUNTER — Ambulatory Visit
Admission: RE | Admit: 2016-06-14 | Discharge: 2016-06-14 | Disposition: A | Discharge status: Home / Self Care | Payer: Medicare Other | Source: Ambulatory Visit | Attending: Hematology | Admitting: Hematology

## 2016-06-14 DIAGNOSIS — R928 Other abnormal and inconclusive findings on diagnostic imaging of breast: Secondary | ICD-10-CM | POA: Diagnosis not present

## 2016-06-14 DIAGNOSIS — Z170 Estrogen receptor positive status [ER+]: Principal | ICD-10-CM

## 2016-06-14 DIAGNOSIS — C50211 Malignant neoplasm of upper-inner quadrant of right female breast: Secondary | ICD-10-CM

## 2016-06-14 HISTORY — DX: Personal history of irradiation: Z92.3

## 2016-06-20 ENCOUNTER — Other Ambulatory Visit: Payer: Self-pay | Admitting: *Deleted

## 2016-06-20 DIAGNOSIS — H2513 Age-related nuclear cataract, bilateral: Secondary | ICD-10-CM | POA: Diagnosis not present

## 2016-06-20 DIAGNOSIS — C50211 Malignant neoplasm of upper-inner quadrant of right female breast: Secondary | ICD-10-CM

## 2016-06-20 DIAGNOSIS — Z170 Estrogen receptor positive status [ER+]: Principal | ICD-10-CM

## 2016-06-20 DIAGNOSIS — H40013 Open angle with borderline findings, low risk, bilateral: Secondary | ICD-10-CM | POA: Diagnosis not present

## 2016-06-20 MED ORDER — TAMOXIFEN CITRATE 20 MG PO TABS: 20.0000 mg | ORAL_TABLET | Freq: Every day | ORAL | 3 refills | Status: DC

## 2016-06-30 DIAGNOSIS — M1712 Unilateral primary osteoarthritis, left knee: Secondary | ICD-10-CM | POA: Diagnosis not present

## 2016-06-30 DIAGNOSIS — M67362 Transient synovitis, left knee: Secondary | ICD-10-CM | POA: Diagnosis not present

## 2016-07-13 DIAGNOSIS — R197 Diarrhea, unspecified: Secondary | ICD-10-CM | POA: Diagnosis not present

## 2016-07-13 DIAGNOSIS — R1030 Lower abdominal pain, unspecified: Secondary | ICD-10-CM | POA: Diagnosis not present

## 2016-07-13 DIAGNOSIS — R933 Abnormal findings on diagnostic imaging of other parts of digestive tract: Secondary | ICD-10-CM | POA: Diagnosis not present

## 2016-07-13 DIAGNOSIS — K922 Gastrointestinal hemorrhage, unspecified: Secondary | ICD-10-CM | POA: Diagnosis not present

## 2016-08-03 DIAGNOSIS — M25552 Pain in left hip: Secondary | ICD-10-CM | POA: Diagnosis not present

## 2016-08-03 DIAGNOSIS — R933 Abnormal findings on diagnostic imaging of other parts of digestive tract: Secondary | ICD-10-CM | POA: Diagnosis not present

## 2016-08-03 DIAGNOSIS — K922 Gastrointestinal hemorrhage, unspecified: Secondary | ICD-10-CM | POA: Diagnosis not present

## 2016-08-03 DIAGNOSIS — M25562 Pain in left knee: Secondary | ICD-10-CM | POA: Diagnosis not present

## 2016-08-03 DIAGNOSIS — S76112A Strain of left quadriceps muscle, fascia and tendon, initial encounter: Secondary | ICD-10-CM | POA: Diagnosis not present

## 2016-08-08 NOTE — Progress Notes (Signed)
[place]  [phone] [phone]) [phone]  Clinic Follow Up Note   Patient Care Team: Moshe Cipro, MD as PCP - General (Internal Medicine) Lonzo Cloud, MD (General Practice) Fanny Skates, MD as Consulting Physician (General Surgery) Truitt Merle, MD as Consulting Physician (Hematology) Thea Silversmith, MD (Inactive) as Consulting Physician (Radiation Oncology) Rockwell Germany, RN as Registered Nurse Mauro Kaufmann, RN as Registered Nurse Holley Bouche, NP as Nurse Practitioner (Nurse Practitioner) Bary Leriche, MD as Referring Physician (Psychiatry) Erline Levine, MD as Consulting Physician (Neurosurgery) 08/10/2016  CHIEF COMPLAINTS:  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 Ultrasound of the right axilla is negative      06/05/2014 Initial Biopsy    Right breast core needle bx: invasive mammary carcinoma, ER- (0%), PR- (0%), HER2/neu negative, Ki67: 98%      07/02/2014 PET scan    1. Intensely hypermetabolic mass in the medial RIGHT chest wall with hypermetabolic right axillary node 2. Hypermetabolic nodal metastasis in the LEFT level 2 and LEFT supraclavicular and periportal node (SUV 5).       07/04/2014 Pathology Results    Left breast mass biopsy showed DCIS, ER100%, PR 48%      07/10/2014 Pathology Results    Left Nora node biopsy was negative for malignant cells, benign reactive lymph tissue       07/10/2014 Clinical Stage    RIGHT: Stage IIA: T2 N0   LEFT:  Stage 0: Tis N0      07/18/2014 - 12/19/2014 Chemotherapy    Dose dense Adriamycin and Cytoxan, every 2 weeks, X4, followed by weekly carbo and Taxol for 12 weeks.      07/28/2014 Pathology Results    10R and 10L lymph node biopsy showed no malignant cells, mixed lymphoid population.      08/11/2014 Miscellaneous    nadir CBC: WBC 0.54, hemoglobin 8.9, hematocrit 26%, platelet 125      01/12/2015 Procedure    Breast/Ovarian ([facility]) panel: no clinically sig variant at ATM, BARD1, BRCA1, BRCA2, BRIP1, CDH1, CHEK2, EPCAM, FANCC, MLH1, MSH2, MSH6, NBN, PALB2, PMS2, PTEN, RAD51C, RAD51D, TP53, and XRCC2      01/15/2015 Surgery    Bilateral lumpectomy and right sentinel lymph node biopsy.      01/15/2015 Pathology Results    Bilateral breast lumpectomy showed fibrocystic change, treatment-related changes, no residual malignancy. 3 right axillary sentinel lymph nodes were negative.      02/24/2015 - 04/14/2015 Radiation Therapy    Right breast 45 Gy over 25 fractions; right supraclav fossa/axilla: 45 Gy over 25 fractions; right breast boost: 16 Gy over 8 fractions.  Total dose RIGHT: 61 Gy  Left breast: 50.4 Gy over 28 fractions.      04/2015 -  Anti-estrogen oral therapy    Tamoxifen 20 mg daily      07/02/2015 Survivorship    Survivorship  visit completed      01/18/2016 PET scan    PET 01/18/2016 IMPRESSION: 1. Surgical changes in both breasts. No evidence of hypermetabolic residual or recurrent tumor. No explanation for left-sided scapular pain. 2. Low-level hypermetabolism about the right pectoralis musculature is in the region of surgical changes and favored to be treatment related. 3.  Coronary artery atherosclerosis. Aortic atherosclerosis      06/14/2016 Imaging    MM Bilateral Breast 06/14/16 IMPRESSION: No mammographic evidence for malignancy.         HISTORY OF PRESENTING ILLNESS:  Kristin Pennington 71 y.o. female is here because of recently diagnosed poorly differentiated carcinoma in her right breast.  She found a lump in her right breast 3 weeks ago, mild tenderness,  No other complains. Last mammo was in oct 2015 which was negative. She feels well overall. She denies any pain, cough, shortness breast, GI symptoms. She has good energy level and appetite, no recent weight loss.  Her last colonoscopy was for 5 years ago which was negative per patient. Her last Pap smear was 3 years ago which was negative per patient.  CURRERNT THERAPY:  Tamoxifen 20 mg once daily, started on Feb 2017  INTERIM HISTORY:  Kristin Pennington returns for follow-up. She presents to the clinic today with her friend. She reports she is doing well abut she recently fell 3 weeks ago and pulled a left leg quadriceps muscle. She had a colonoscopy in [place] and it came but good. She has rectal bleeding and the last two weeks she did not have any but had another episode yesterday. Her Hemorid is not bothering her during BM. She hot flashes are on and off but manageable. She had a bone density scan with Dr. Megan Salon in [place] in October 2018 and reports her density is normal.     MEDICAL HISTORY:  Past Medical History:  Diagnosis Date  . Arthritis    knee  . Breast cancer ([place])    bilateral, right breast mass, cancer both breast, x1 chemo  a week ago.  [place] Kitchen GERD (gastroesophageal reflux disease)    with chemo  . Glaucoma    "early" - eye doctor is watching  . Personal history of radiation therapy   . Pneumonia    2015  . 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 Wears glasses   . Wears partial dentures    top     SURGICAL HISTORY: Past Surgical History:  Procedure Laterality Date  . ABDOMINAL HYSTERECTOMY    . APPENDECTOMY    . BREAST BIOPSY    . BREAST LUMPECTOMY    . BREAST LUMPECTOMY WITH RADIOACTIVE SEED AND SENTINEL LYMPH NODE BIOPSY Right 01/15/2015   Procedure: RIGHT BREAST  LUMPECTOMY WITH RADIOACTIVE SEED AND SENTINEL LYMPH NODE BIOPSY;  Surgeon: Fanny Skates, MD;  Location: [facility];  Service: General;  Laterality: Right;  . BREAST LUMPECTOMY WITH RADIOACTIVE SEED LOCALIZATION Left 01/15/2015   Procedure: LEFT BREAST LUMPECTOMY WITH RADIOACTIVE SEED LOCALIZATION;  Surgeon: Fanny Skates, MD;  Location: [facility];  Service: General;  Laterality: Left;  . CARPAL TUNNEL RELEASE Left   . CHOLECYSTECTOMY    . COLONOSCOPY    . ENDOBRONCHIAL ULTRASOUND Bilateral 07/28/2014   Procedure: ENDOBRONCHIAL ULTRASOUND;  Surgeon: Collene Gobble, MD;  Location: [facility];  Service: Cardiopulmonary;  Laterality: Bilateral;  . PORT-A-CATH REMOVAL Left 01/15/2015   Procedure: REMOVAL PORT-A-CATH;  Surgeon: Fanny Skates, MD;  Location: [facility];  Service: General;  Laterality: Left;  . PORTACATH PLACEMENT N/A 06/30/2014   Procedure: INSERTION PORT-A-CATH WITH ULTRA SOUND ON STANDBY;  Surgeon: Fanny Skates, MD;  Location: [facility];  Service: General;  Laterality: N/A;  . TONSILLECTOMY     SOCIAL HISTORY: Social History   Social History  . Marital status: Married    Spouse name: N/A  . Number of children: N/A  . Years of education: N/A   Occupational History  . Not on file.   Social History Main Topics  . Smoking status: Former Smoker    Packs/day: 1.00    Years: 18.00    Types: Cigarettes    Quit date: 11/08/1988  . Smokeless tobacco: Never Used  . Alcohol use No  . Drug use: No  . Sexual activity: Not on file   Other Topics Concern  . Not on file   Social History Narrative  . No narrative on file     GYN HISTORY  Menarchal: 11 LMP: 1994, hysrectomy  Contraceptive: no  HRT: since 1994  G2P1, one miscarriage     FAMILY HISTORY: Family History  Problem Relation Age of Onset  . Stomach cancer Father 1  . Lung cancer Sister 73       not sure if primary or breast cancer met  . Breast cancer Maternal Aunt 72  . Breast cancer Cousin 86  .  Heart Problems Paternal Aunt   . Congestive Heart Failure Mother     ALLERGIES:  is allergic to aspirin; codeine; diclofenac; meloxicam; nitrofurantoin; other; pyridium [phenazopyridine hcl]; sulfa antibiotics; tetracyclines & related; and tape.  MEDICATIONS:  Current Outpatient Prescriptions  Medication Sig Dispense Refill  . aspirin EC 81 MG tablet Take 81 mg by mouth daily.     . Calcium Carb-Cholecalciferol (CALCIUM 600+D3) 600-400 MG-UNIT TABS Take 1 tablet by mouth 2 (two) times daily.    . clotrimazole-betamethasone (LOTRISONE) cream     . cyanocobalamin (,VITAMIN B-12,) 1000 MCG/ML injection Inject 1,000 mcg into the muscle every 30 (thirty) days.     . diclofenac (VOLTAREN) 50 MG EC tablet Take 50 mg by mouth 2 (two) times daily.    Mar[place] Kitchen HYDROcodone-acetaminophen (NORCO) 5-325 MG tablet Take 1-2 tablets by mouth every 6 (six) hours as needed for moderate pain or severe pain. 30 tablet 0  . levETIRAcetam (KEPPRA) 500 MG tablet Take 1 tablet by mouth 2 (two) times daily.    [place] Kitchen oxaprozin (DAYPRO) 600 MG tablet Take 1 tablet by mouth 2 (two) times daily. Reported on 07/27/2015    . phenytoin (DILANTIN) 100 MG ER capsule Take 1 tablet by mouth 4 (four) times daily. On  Sat , Sun, Tues, Wed, and  Thursdays    -  Take   15m  QID.    .Mar[place] Kitchensodium chloride (OCEAN) 0.65 % SOLN nasal spray Place 1 spray into both nostrils as needed for congestion.    . tamoxifen (NOLVADEX) 20 MG tablet Take 1 tablet (20 mg total) by mouth daily. 90 tablet 3  . Tetrahydrozoline HCl ([employer] EYE DROPS OP) Apply 1 drop to eye 3 (three) times daily.     No current facility-administered medications for this visit.     REVIEW OF SYSTEMS:   Constitutional: Denies fevers (+) on and off hot flashes Eyes: Denies blurriness of vision, double vision or watery eyes Ears, nose, mouth, throat, and face: Denies mucositis or sore throat Respiratory: Denies cough, dyspnea or wheezes Cardiovascular: Denies palpitation, chest discomfort  or lower extremity swelling Gastrointestinal:  Denies nausea, heartburn or change in bowel habits (+) rectal bleeding (+) Hemorid  Skin: Denies abnormal skin rashes Lymphatics: Denies new lymphadenopathy or easy bruising MSK: (+) pulled left leg quadricepts  Neurological:Denies numbness, tingling or new weaknesses Behavioral/Psych: Mood is stable, no new changes  All other systems were reviewed with the patient and are negative.  PHYSICAL EXAMINATION: ECOG PERFORMANCE STATUS: 1  BP 115/70 (BP Location: Left Arm)   Pulse 65   Temp 98 F (36.7 C) (Oral)   Resp 18   Ht 5' 6" (1.676 m)   Wt 160 lb 14.4 oz (73 kg)   SpO2 98%   BMI 25.97 kg/m    GENERAL:alert, no distress and comfortable SKIN: skin color, texture, turgor are normal, no rashes or significant lesions EYES: normal, conjunctiva are pink and non-injected, sclera clear OROPHARYNX:no exudate, no erythema and lips, buccal mucosa, and tongue normal  NECK: supple, thyroid normal size, non-tender, without nodularity LYMPH:  no palpable lymphadenopathy in the cervical, axillary or inguinal LUNGS: clear to auscultation and percussion with normal breathing effort HEART: regular rate & rhythm and no murmurs and no lower extremity edema ABDOMEN:abdomen soft, non-tender and normal bowel sounds Musculoskeletal:no cyanosis of digits and no clubbing, no significant right arm swelling PSYCH: alert & oriented x 3 with fluent speech NEURO: no focal motor/sensory deficits Breasts: Breast inspection showed them to be symmetrical with no nipple discharge.  (+) Surgical incision site in both breasts have healed well, no significant scar. are healing well, no significant skin pigmentation or edema. No other palpable mass or adenopathy.  LABORATORY DATA:  I have reviewed the data as listed CBC Latest Ref Rng & Units 08/10/2016 04/22/2016 11/27/2015  WBC 3.9 - 10.3 10e3/uL 4.4 3.9 4.1  Hemoglobin 11.6 - 15.9 g/dL 12.6 13.0 13.1  Hematocrit 34.8 -  46.6 % 36.7 38.7 38.6  Platelets 145 - 400 10e3/uL 243 276 163    CMP Latest Ref Rng & Units 08/10/2016 04/22/2016 11/27/2015  Glucose 70 - 140 mg/dl 93 85 110  BUN 7.0 - 26.0 mg/dL 15.2 21.7 17.1  Creatinine 0.6 - 1.1 mg/dL 0.8 0.8 0.8  Sodium 136 - 145 mEq/L 142 140 141  Potassium 3.5 - 5.1 mEq/L 3.9 4.0 4.0  Chloride 96 - 112 mmol/L - - -  CO2 22 - 29 mEq/L _0 Calcium 8.4 - 10.4 mg/dL 8.9 9.4 9.0  Total Protein 6.4 - 8.3 g/dL 6.9 7.4 7.2  Total Bilirubin 0.20 - 1.20 mg/dL <0.22 <0.22 <0.30  Alkaline Phos 40 - 150 U/L 80 78 92  AST 5 - 34 U/L _1 ALT 0 - 55 U/L _2 Her outside CBC and CMP were unremarkable on 01/14/2016   PATHOLOGY REPORT 06/05/2014 Diagnosis 01/15/2015 1. Breast, lumpectomy, Left - FIBROCYSTIC CHANGE WITH ADENOSIS AND CALCIFICATIONS. - TREATMENT RELATED CHANGES. - THERE IS NO EVIDENCE OF MALIGNANCY. - SEE COMMENT. 2. Breast, lumpectomy, Right - FIBROCYSTIC CHANGES WITH ADENOSIS. - TREATMENT RELATED CHANGES. - THERE IS NO EVIDENCE OF MALIGNANCY. - SEE COMMENT. 3. Lymph node, sentinel, biopsy, right axillary #1 - THERE IS NO EVIDENCE OF CARCINOMA IN 1 OF 1 LYMPH NODE (0/1). - SEE COMMENT 4. Lymph node, sentinel, biopsy, right axillary #2 - THERE IS NO EVIDENCE OF CARCINOMA IN 1 OF 1 LYMPH NODE (0/1). - SEE COMMENT. 5. Lymph node, sentinel, biopsy, right axillary #3 - THERE IS NO EVIDENCE OF CARCINOMA IN 1 OF 1 LYMPH NODE (0/1). - SEE  COMMENT.  Microscopic Comment 1. Carcinoma is not identified in the current specimen. The surgical resection margin(s) of the specimen were inked and microscopically evaluated. 2. Carcinoma is not identified in the current specimen. The surgical resection margin(s) of the specimen were inked and microscopically evaluated. 3. -5. There are no significant treatment related changes present in the histologically evaluated sections of the lymph nodes. (JBK:kh 01-19-15)  Diagnosis  07/04/2014 Breast, left,  needle core biopsy, UOQ - DUCTAL CARCINOMA IN-SITU. - SEE COMMENT.  Microscopic Comment The ductal carcinoma in situ is low grade. The carcinoma cells are positive for e-cadherin and cytokeratin AE1/AE3. Smooth muscle myosin, calponin, and p63 stains highlight the presence of myoepithelial cells. Estrogen receptor and progesterone receptor studies will be performed and the results reported separately. The results were called to [facility] on 07-08-2014. IMMUNOHISTOCHEMICAL AND MORPHOMETRIC ANALYSIS BY THE AUTOMATED CELLULAR IMAGING SYSTEM (ACIS) Estrogen Receptor: 100%, POSITIVE, STRONG STAINING INTENSITY Progesterone Receptor: 28%, POSITIVE, WEAK STAINING INTENSITY   RADIOGRAPHIC STUDIES: I have personally reviewed the radiological images as listed and agreed with the findings in the report.  MM Bilateral Breast 06/14/16 IMPRESSION: No mammographic evidence for malignancy.   PET 01/18/2016 IMPRESSION: 1. Surgical changes in both breasts. No evidence of hypermetabolic residual or recurrent tumor. No explanation for left-sided scapular pain. 2. Low-level hypermetabolism about the right pectoralis musculature is in the region of surgical changes and favored to be treatment related. 3.  Coronary artery atherosclerosis. Aortic atherosclerosis.   Diagnostic mammogram b/l 06/12/2015 IMPRESSION: No mammographic evidence of malignancy in either breast.  RECOMMENDATION: Diagnostic mammogram is suggested in 1 year. (Code:DM-B-01Y)    ASSESSMENT & PLAN:  71 y.o. postmenopausal woman, with past medical history of seizure and arthritis, presented with a palpable mass in the inner upper quadrant of her right breast. Biopsy revealed poorly differentiated carcinoma, triple negative.  1. Poorly differentiated mammary carcinoma in her right breast, cT2NxM0, triple negative, ypT0N0 - she received neoadjuvant chemotherapy , with complete pathological response - she also  completed adjuvant breast irradiation, and tolerated well, - we discussed the surveillance plan,  Which includes annual screening mammogram, self-exam and follow-up visit every 3-4 months for the first 2 years, and every 6 months afterwards for additional 3 years. -Her annual screening mammogram in March 2017 was normal. -Lab previously reviewed with her, CBC and CMP are normal, her exam was unremarkable today.  -Her last PET scan in 12/2015 was negative for metastatic lesions, I don't think she needs more surveillance CT or PET scan. -We'll continue surveillance. -She is clinically doing well, tolerating tamoxifen well, lab reviewed, exam was unremarkable, no clinical concern for recurrence. -Continue breast cancer surveillance.   2. Left breast DCIS,  ER+/PR+ -The left breast DCIS has no residual tumor on the surgical sample, it was ER/PR strongly positive. -she is on chemoprevention with tamoxifen, tolerating well except mild hot flash.  3. Cervical spine stenosis, left shoulder blade pain -She is seeing her orthopedic surgeon Dr. Megan Salon and neurosurgeon Dr. 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 Vertell Limber  4. Hot flush -Secondary to tamoxifen. -Tolerable, patient previously declined medication. We'll continue monitoring.   5. Genetic -She has multiple family members had breast cancer -her genetic testing was negative for the following 20 genes: ATM, BARD1, BRCA1, BRCA2, BRIP1, CDH1, CHEK2, EPCAM, FANCC, MLH1, MSH2, MSH6, NBN, PALB2, PMS2, PTEN,  RAD51C, RAD51D, TP53, and XRCC2  Plan: -continue tamoxifen for breast cancer chemoprevention -lab and f/u in 5 months   All questions were answered. The patient knows to call the clinic with any problems, questions or concerns.  I spent 20 minutes counseling the patient face to face. The total  time spent in the appointment was 30 minutes and more than 50% was on counseling.  This document serves as a record of services personally performed by Truitt Merle, MD. It was created on her behalf by Joslyn Devon, a trained medical scribe. The creation of this record is based on the scribe's personal observations and the provider's statements to them. This document has been checked and approved by the attending provider.    Truitt Merle, MD 08/10/2016 10:01 AM

## 2016-08-09 ENCOUNTER — Other Ambulatory Visit: Payer: Self-pay | Admitting: *Deleted

## 2016-08-09 DIAGNOSIS — C50211 Malignant neoplasm of upper-inner quadrant of right female breast: Secondary | ICD-10-CM

## 2016-08-09 DIAGNOSIS — D0512 Intraductal carcinoma in situ of left breast: Secondary | ICD-10-CM

## 2016-08-10 ENCOUNTER — Telehealth: Payer: Self-pay | Admitting: Hematology

## 2016-08-10 ENCOUNTER — Other Ambulatory Visit (HOSPITAL_BASED_OUTPATIENT_CLINIC_OR_DEPARTMENT_OTHER): Payer: Medicare Other

## 2016-08-10 ENCOUNTER — Ambulatory Visit (HOSPITAL_BASED_OUTPATIENT_CLINIC_OR_DEPARTMENT_OTHER): Payer: Medicare Other | Admitting: Hematology

## 2016-08-10 ENCOUNTER — Encounter: Payer: Self-pay | Admitting: Hematology

## 2016-08-10 VITALS — BP 115/70 | HR 65 | Temp 98.0°F | Resp 18 | Ht 66.0 in | Wt 160.9 lb

## 2016-08-10 DIAGNOSIS — C50211 Malignant neoplasm of upper-inner quadrant of right female breast: Secondary | ICD-10-CM | POA: Diagnosis not present

## 2016-08-10 DIAGNOSIS — D0512 Intraductal carcinoma in situ of left breast: Secondary | ICD-10-CM

## 2016-08-10 DIAGNOSIS — Z171 Estrogen receptor negative status [ER-]: Secondary | ICD-10-CM

## 2016-08-10 LAB — CBC WITH DIFFERENTIAL/PLATELET
BASO%: 1.7 % (ref 0.0–2.0)
Basophils Absolute: 0.1 10*3/uL (ref 0.0–0.1)
EOS%: 5.9 % (ref 0.0–7.0)
Eosinophils Absolute: 0.3 10*3/uL (ref 0.0–0.5)
HCT: 36.7 % (ref 34.8–46.6)
HGB: 12.6 g/dL (ref 11.6–15.9)
LYMPH%: 19.2 % (ref 14.0–49.7)
MCH: 32.5 pg (ref 25.1–34.0)
MCHC: 34.4 g/dL (ref 31.5–36.0)
MCV: 94.4 fL (ref 79.5–101.0)
MONO#: 0.5 10*3/uL (ref 0.1–0.9)
MONO%: 10.6 % (ref 0.0–14.0)
NEUT#: 2.8 10*3/uL (ref 1.5–6.5)
NEUT%: 62.6 % (ref 38.4–76.8)
Platelets: 243 10*3/uL (ref 145–400)
RBC: 3.89 10*6/uL (ref 3.70–5.45)
RDW: 12.7 % (ref 11.2–14.5)
WBC: 4.4 10*3/uL (ref 3.9–10.3)
lymph#: 0.8 10*3/uL — ABNORMAL LOW (ref 0.9–3.3)

## 2016-08-10 LAB — COMPREHENSIVE METABOLIC PANEL
ALT: 12 U/L (ref 0–55)
AST: 14 U/L (ref 5–34)
Albumin: 3.1 g/dL — ABNORMAL LOW (ref 3.5–5.0)
Alkaline Phosphatase: 80 U/L (ref 40–150)
Anion Gap: 9 mEq/L (ref 3–11)
BUN: 15.2 mg/dL (ref 7.0–26.0)
CO2: 29 mEq/L (ref 22–29)
Calcium: 8.9 mg/dL (ref 8.4–10.4)
Chloride: 103 mEq/L (ref 98–109)
Creatinine: 0.8 mg/dL (ref 0.6–1.1)
EGFR: 78 mL/min/{1.73_m2} — ABNORMAL LOW (ref >90)
Glucose: 93 mg/dl (ref 70–140)
Potassium: 3.9 mEq/L (ref 3.5–5.1)
Sodium: 142 mEq/L (ref 136–145)
Total Bilirubin: <0.22 mg/dL (ref 0.20–1.20)
Total Protein: 6.9 g/dL (ref 6.4–8.3)

## 2016-08-10 NOTE — Telephone Encounter (Signed)
Gave patient AVS and calender per 5/16 los. Lab and f/u in 5 months.

## 2016-08-24 DIAGNOSIS — M5136 Other intervertebral disc degeneration, lumbar region: Secondary | ICD-10-CM | POA: Diagnosis not present

## 2016-08-24 DIAGNOSIS — M25552 Pain in left hip: Secondary | ICD-10-CM | POA: Diagnosis not present

## 2016-08-31 DIAGNOSIS — K6289 Other specified diseases of anus and rectum: Secondary | ICD-10-CM | POA: Diagnosis not present

## 2016-08-31 DIAGNOSIS — K922 Gastrointestinal hemorrhage, unspecified: Secondary | ICD-10-CM | POA: Diagnosis not present

## 2016-09-19 DIAGNOSIS — M5416 Radiculopathy, lumbar region: Secondary | ICD-10-CM | POA: Diagnosis not present

## 2016-09-19 DIAGNOSIS — M25552 Pain in left hip: Secondary | ICD-10-CM | POA: Diagnosis not present

## 2016-09-19 DIAGNOSIS — M5136 Other intervertebral disc degeneration, lumbar region: Secondary | ICD-10-CM | POA: Diagnosis not present

## 2016-09-23 DIAGNOSIS — S32048A Other fracture of fourth lumbar vertebra, initial encounter for closed fracture: Secondary | ICD-10-CM | POA: Diagnosis not present

## 2016-09-23 DIAGNOSIS — S32009A Unspecified fracture of unspecified lumbar vertebra, initial encounter for closed fracture: Secondary | ICD-10-CM | POA: Diagnosis not present

## 2016-09-23 DIAGNOSIS — M25511 Pain in right shoulder: Secondary | ICD-10-CM | POA: Diagnosis not present

## 2016-09-23 DIAGNOSIS — Y9289 Other specified places as the place of occurrence of the external cause: Secondary | ICD-10-CM | POA: Diagnosis not present

## 2016-09-23 DIAGNOSIS — W19XXXA Unspecified fall, initial encounter: Secondary | ICD-10-CM | POA: Diagnosis not present

## 2016-09-23 DIAGNOSIS — S32038A Other fracture of third lumbar vertebra, initial encounter for closed fracture: Secondary | ICD-10-CM | POA: Diagnosis not present

## 2016-09-23 DIAGNOSIS — G4089 Other seizures: Secondary | ICD-10-CM | POA: Diagnosis not present

## 2016-09-23 DIAGNOSIS — S32028A Other fracture of second lumbar vertebra, initial encounter for closed fracture: Secondary | ICD-10-CM | POA: Diagnosis not present

## 2016-09-23 DIAGNOSIS — Z0289 Encounter for other administrative examinations: Secondary | ICD-10-CM | POA: Diagnosis not present

## 2016-09-23 DIAGNOSIS — S3981XA Other specified injuries of abdomen, initial encounter: Secondary | ICD-10-CM | POA: Diagnosis not present

## 2016-09-23 DIAGNOSIS — S32018A Other fracture of first lumbar vertebra, initial encounter for closed fracture: Secondary | ICD-10-CM | POA: Diagnosis not present

## 2016-09-23 DIAGNOSIS — Y9389 Activity, other specified: Secondary | ICD-10-CM | POA: Diagnosis not present

## 2016-09-23 DIAGNOSIS — S0990XA Unspecified injury of head, initial encounter: Secondary | ICD-10-CM | POA: Diagnosis not present

## 2016-09-23 DIAGNOSIS — Y998 Other external cause status: Secondary | ICD-10-CM | POA: Diagnosis not present

## 2016-09-23 DIAGNOSIS — W1789XA Other fall from one level to another, initial encounter: Secondary | ICD-10-CM | POA: Diagnosis not present

## 2016-09-23 DIAGNOSIS — S2232XA Fracture of one rib, left side, initial encounter for closed fracture: Secondary | ICD-10-CM | POA: Diagnosis not present

## 2016-09-24 DIAGNOSIS — Y998 Other external cause status: Secondary | ICD-10-CM | POA: Diagnosis not present

## 2016-09-24 DIAGNOSIS — Y9389 Activity, other specified: Secondary | ICD-10-CM | POA: Diagnosis not present

## 2016-09-24 DIAGNOSIS — M1990 Unspecified osteoarthritis, unspecified site: Secondary | ICD-10-CM | POA: Diagnosis present

## 2016-09-24 DIAGNOSIS — S32028A Other fracture of second lumbar vertebra, initial encounter for closed fracture: Secondary | ICD-10-CM | POA: Diagnosis not present

## 2016-09-24 DIAGNOSIS — K5900 Constipation, unspecified: Secondary | ICD-10-CM | POA: Diagnosis not present

## 2016-09-24 DIAGNOSIS — M25511 Pain in right shoulder: Secondary | ICD-10-CM | POA: Diagnosis present

## 2016-09-24 DIAGNOSIS — S32009A Unspecified fracture of unspecified lumbar vertebra, initial encounter for closed fracture: Secondary | ICD-10-CM | POA: Diagnosis not present

## 2016-09-24 DIAGNOSIS — S32029A Unspecified fracture of second lumbar vertebra, initial encounter for closed fracture: Secondary | ICD-10-CM | POA: Diagnosis not present

## 2016-09-24 DIAGNOSIS — S32038A Other fracture of third lumbar vertebra, initial encounter for closed fracture: Secondary | ICD-10-CM | POA: Diagnosis not present

## 2016-09-24 DIAGNOSIS — Y9289 Other specified places as the place of occurrence of the external cause: Secondary | ICD-10-CM | POA: Diagnosis not present

## 2016-09-24 DIAGNOSIS — W19XXXA Unspecified fall, initial encounter: Secondary | ICD-10-CM | POA: Diagnosis not present

## 2016-09-24 DIAGNOSIS — S32039A Unspecified fracture of third lumbar vertebra, initial encounter for closed fracture: Secondary | ICD-10-CM | POA: Diagnosis not present

## 2016-09-24 DIAGNOSIS — G4089 Other seizures: Secondary | ICD-10-CM | POA: Diagnosis not present

## 2016-09-24 DIAGNOSIS — S2232XA Fracture of one rib, left side, initial encounter for closed fracture: Secondary | ICD-10-CM | POA: Diagnosis not present

## 2016-09-24 DIAGNOSIS — G40909 Epilepsy, unspecified, not intractable, without status epilepticus: Secondary | ICD-10-CM | POA: Diagnosis present

## 2016-09-24 DIAGNOSIS — S32018A Other fracture of first lumbar vertebra, initial encounter for closed fracture: Secondary | ICD-10-CM | POA: Diagnosis not present

## 2016-09-24 DIAGNOSIS — Z7409 Other reduced mobility: Secondary | ICD-10-CM | POA: Diagnosis not present

## 2016-09-24 DIAGNOSIS — Z853 Personal history of malignant neoplasm of breast: Secondary | ICD-10-CM | POA: Diagnosis not present

## 2016-09-24 DIAGNOSIS — M549 Dorsalgia, unspecified: Secondary | ICD-10-CM | POA: Diagnosis not present

## 2016-09-24 DIAGNOSIS — S32048A Other fracture of fourth lumbar vertebra, initial encounter for closed fracture: Secondary | ICD-10-CM | POA: Diagnosis not present

## 2016-09-24 DIAGNOSIS — S32049A Unspecified fracture of fourth lumbar vertebra, initial encounter for closed fracture: Secondary | ICD-10-CM | POA: Diagnosis not present

## 2016-09-24 DIAGNOSIS — W1789XA Other fall from one level to another, initial encounter: Secondary | ICD-10-CM | POA: Diagnosis not present

## 2016-09-24 DIAGNOSIS — S32019A Unspecified fracture of first lumbar vertebra, initial encounter for closed fracture: Secondary | ICD-10-CM | POA: Diagnosis not present

## 2016-09-27 DIAGNOSIS — S32009S Unspecified fracture of unspecified lumbar vertebra, sequela: Secondary | ICD-10-CM | POA: Diagnosis not present

## 2016-09-27 DIAGNOSIS — Z9889 Other specified postprocedural states: Secondary | ICD-10-CM | POA: Diagnosis not present

## 2016-09-27 DIAGNOSIS — Z886 Allergy status to analgesic agent status: Secondary | ICD-10-CM | POA: Diagnosis not present

## 2016-09-27 DIAGNOSIS — S32049A Unspecified fracture of fourth lumbar vertebra, initial encounter for closed fracture: Secondary | ICD-10-CM | POA: Diagnosis present

## 2016-09-27 DIAGNOSIS — Z9221 Personal history of antineoplastic chemotherapy: Secondary | ICD-10-CM | POA: Diagnosis not present

## 2016-09-27 DIAGNOSIS — Z882 Allergy status to sulfonamides status: Secondary | ICD-10-CM | POA: Diagnosis not present

## 2016-09-27 DIAGNOSIS — Z79899 Other long term (current) drug therapy: Secondary | ICD-10-CM | POA: Diagnosis not present

## 2016-09-27 DIAGNOSIS — W19XXXA Unspecified fall, initial encounter: Secondary | ICD-10-CM | POA: Diagnosis not present

## 2016-09-27 DIAGNOSIS — Z7409 Other reduced mobility: Secondary | ICD-10-CM | POA: Diagnosis not present

## 2016-09-27 DIAGNOSIS — Z9189 Other specified personal risk factors, not elsewhere classified: Secondary | ICD-10-CM | POA: Diagnosis not present

## 2016-09-27 DIAGNOSIS — Z923 Personal history of irradiation: Secondary | ICD-10-CM | POA: Diagnosis not present

## 2016-09-27 DIAGNOSIS — M1990 Unspecified osteoarthritis, unspecified site: Secondary | ICD-10-CM | POA: Diagnosis present

## 2016-09-27 DIAGNOSIS — G40909 Epilepsy, unspecified, not intractable, without status epilepticus: Secondary | ICD-10-CM | POA: Diagnosis present

## 2016-09-27 DIAGNOSIS — Z853 Personal history of malignant neoplasm of breast: Secondary | ICD-10-CM | POA: Diagnosis not present

## 2016-09-27 DIAGNOSIS — S32019A Unspecified fracture of first lumbar vertebra, initial encounter for closed fracture: Secondary | ICD-10-CM | POA: Diagnosis present

## 2016-09-27 DIAGNOSIS — M549 Dorsalgia, unspecified: Secondary | ICD-10-CM | POA: Diagnosis not present

## 2016-09-27 DIAGNOSIS — G4089 Other seizures: Secondary | ICD-10-CM | POA: Diagnosis not present

## 2016-09-27 DIAGNOSIS — S32009A Unspecified fracture of unspecified lumbar vertebra, initial encounter for closed fracture: Secondary | ICD-10-CM | POA: Diagnosis not present

## 2016-09-27 DIAGNOSIS — S32039A Unspecified fracture of third lumbar vertebra, initial encounter for closed fracture: Secondary | ICD-10-CM | POA: Diagnosis present

## 2016-09-27 DIAGNOSIS — S2232XS Fracture of one rib, left side, sequela: Secondary | ICD-10-CM | POA: Diagnosis not present

## 2016-09-27 DIAGNOSIS — S2232XA Fracture of one rib, left side, initial encounter for closed fracture: Secondary | ICD-10-CM | POA: Diagnosis not present

## 2016-09-27 DIAGNOSIS — Z881 Allergy status to other antibiotic agents status: Secondary | ICD-10-CM | POA: Diagnosis not present

## 2016-09-27 DIAGNOSIS — Z90710 Acquired absence of both cervix and uterus: Secondary | ICD-10-CM | POA: Diagnosis not present

## 2016-09-27 DIAGNOSIS — S32029A Unspecified fracture of second lumbar vertebra, initial encounter for closed fracture: Secondary | ICD-10-CM | POA: Diagnosis present

## 2016-10-10 DIAGNOSIS — S32009A Unspecified fracture of unspecified lumbar vertebra, initial encounter for closed fracture: Secondary | ICD-10-CM | POA: Diagnosis not present

## 2016-10-10 DIAGNOSIS — M1991 Primary osteoarthritis, unspecified site: Secondary | ICD-10-CM | POA: Diagnosis not present

## 2016-10-10 DIAGNOSIS — M6281 Muscle weakness (generalized): Secondary | ICD-10-CM | POA: Diagnosis not present

## 2016-10-10 DIAGNOSIS — C50912 Malignant neoplasm of unspecified site of left female breast: Secondary | ICD-10-CM | POA: Diagnosis not present

## 2016-10-10 DIAGNOSIS — S2232XD Fracture of one rib, left side, subsequent encounter for fracture with routine healing: Secondary | ICD-10-CM | POA: Diagnosis not present

## 2016-10-10 DIAGNOSIS — G40909 Epilepsy, unspecified, not intractable, without status epilepticus: Secondary | ICD-10-CM | POA: Diagnosis not present

## 2016-10-12 DIAGNOSIS — Z7409 Other reduced mobility: Secondary | ICD-10-CM | POA: Diagnosis not present

## 2016-10-12 DIAGNOSIS — M549 Dorsalgia, unspecified: Secondary | ICD-10-CM | POA: Diagnosis not present

## 2016-10-12 DIAGNOSIS — Z09 Encounter for follow-up examination after completed treatment for conditions other than malignant neoplasm: Secondary | ICD-10-CM | POA: Diagnosis not present

## 2016-10-12 DIAGNOSIS — S32009A Unspecified fracture of unspecified lumbar vertebra, initial encounter for closed fracture: Secondary | ICD-10-CM | POA: Diagnosis not present

## 2016-10-12 DIAGNOSIS — S2239XA Fracture of one rib, unspecified side, initial encounter for closed fracture: Secondary | ICD-10-CM | POA: Diagnosis not present

## 2016-10-13 DIAGNOSIS — C50912 Malignant neoplasm of unspecified site of left female breast: Secondary | ICD-10-CM | POA: Diagnosis not present

## 2016-10-13 DIAGNOSIS — S32009A Unspecified fracture of unspecified lumbar vertebra, initial encounter for closed fracture: Secondary | ICD-10-CM | POA: Diagnosis not present

## 2016-10-13 DIAGNOSIS — M1991 Primary osteoarthritis, unspecified site: Secondary | ICD-10-CM | POA: Diagnosis not present

## 2016-10-13 DIAGNOSIS — M5416 Radiculopathy, lumbar region: Secondary | ICD-10-CM | POA: Diagnosis not present

## 2016-10-13 DIAGNOSIS — S2232XD Fracture of one rib, left side, subsequent encounter for fracture with routine healing: Secondary | ICD-10-CM | POA: Diagnosis not present

## 2016-10-13 DIAGNOSIS — M545 Low back pain: Secondary | ICD-10-CM | POA: Diagnosis not present

## 2016-10-13 DIAGNOSIS — G40909 Epilepsy, unspecified, not intractable, without status epilepticus: Secondary | ICD-10-CM | POA: Diagnosis not present

## 2016-10-13 DIAGNOSIS — M6281 Muscle weakness (generalized): Secondary | ICD-10-CM | POA: Diagnosis not present

## 2016-10-14 DIAGNOSIS — C50912 Malignant neoplasm of unspecified site of left female breast: Secondary | ICD-10-CM | POA: Diagnosis not present

## 2016-10-14 DIAGNOSIS — M6281 Muscle weakness (generalized): Secondary | ICD-10-CM | POA: Diagnosis not present

## 2016-10-14 DIAGNOSIS — G40909 Epilepsy, unspecified, not intractable, without status epilepticus: Secondary | ICD-10-CM | POA: Diagnosis not present

## 2016-10-14 DIAGNOSIS — S32009A Unspecified fracture of unspecified lumbar vertebra, initial encounter for closed fracture: Secondary | ICD-10-CM | POA: Diagnosis not present

## 2016-10-14 DIAGNOSIS — M1991 Primary osteoarthritis, unspecified site: Secondary | ICD-10-CM | POA: Diagnosis not present

## 2016-10-14 DIAGNOSIS — S2232XD Fracture of one rib, left side, subsequent encounter for fracture with routine healing: Secondary | ICD-10-CM | POA: Diagnosis not present

## 2016-10-18 DIAGNOSIS — G40909 Epilepsy, unspecified, not intractable, without status epilepticus: Secondary | ICD-10-CM | POA: Diagnosis not present

## 2016-10-18 DIAGNOSIS — M6281 Muscle weakness (generalized): Secondary | ICD-10-CM | POA: Diagnosis not present

## 2016-10-18 DIAGNOSIS — M1991 Primary osteoarthritis, unspecified site: Secondary | ICD-10-CM | POA: Diagnosis not present

## 2016-10-18 DIAGNOSIS — S32009A Unspecified fracture of unspecified lumbar vertebra, initial encounter for closed fracture: Secondary | ICD-10-CM | POA: Diagnosis not present

## 2016-10-18 DIAGNOSIS — C50912 Malignant neoplasm of unspecified site of left female breast: Secondary | ICD-10-CM | POA: Diagnosis not present

## 2016-10-18 DIAGNOSIS — S2232XD Fracture of one rib, left side, subsequent encounter for fracture with routine healing: Secondary | ICD-10-CM | POA: Diagnosis not present

## 2016-10-19 DIAGNOSIS — C50912 Malignant neoplasm of unspecified site of left female breast: Secondary | ICD-10-CM | POA: Diagnosis not present

## 2016-10-19 DIAGNOSIS — S2232XD Fracture of one rib, left side, subsequent encounter for fracture with routine healing: Secondary | ICD-10-CM | POA: Diagnosis not present

## 2016-10-19 DIAGNOSIS — M1991 Primary osteoarthritis, unspecified site: Secondary | ICD-10-CM | POA: Diagnosis not present

## 2016-10-19 DIAGNOSIS — M6281 Muscle weakness (generalized): Secondary | ICD-10-CM | POA: Diagnosis not present

## 2016-10-19 DIAGNOSIS — G40909 Epilepsy, unspecified, not intractable, without status epilepticus: Secondary | ICD-10-CM | POA: Diagnosis not present

## 2016-10-19 DIAGNOSIS — S32009A Unspecified fracture of unspecified lumbar vertebra, initial encounter for closed fracture: Secondary | ICD-10-CM | POA: Diagnosis not present

## 2016-10-21 DIAGNOSIS — S32009A Unspecified fracture of unspecified lumbar vertebra, initial encounter for closed fracture: Secondary | ICD-10-CM | POA: Diagnosis not present

## 2016-10-21 DIAGNOSIS — S2232XD Fracture of one rib, left side, subsequent encounter for fracture with routine healing: Secondary | ICD-10-CM | POA: Diagnosis not present

## 2016-10-21 DIAGNOSIS — M1991 Primary osteoarthritis, unspecified site: Secondary | ICD-10-CM | POA: Diagnosis not present

## 2016-10-21 DIAGNOSIS — C50912 Malignant neoplasm of unspecified site of left female breast: Secondary | ICD-10-CM | POA: Diagnosis not present

## 2016-10-21 DIAGNOSIS — M6281 Muscle weakness (generalized): Secondary | ICD-10-CM | POA: Diagnosis not present

## 2016-10-21 DIAGNOSIS — G40909 Epilepsy, unspecified, not intractable, without status epilepticus: Secondary | ICD-10-CM | POA: Diagnosis not present

## 2016-10-24 DIAGNOSIS — S32009A Unspecified fracture of unspecified lumbar vertebra, initial encounter for closed fracture: Secondary | ICD-10-CM | POA: Diagnosis not present

## 2016-10-24 DIAGNOSIS — M1991 Primary osteoarthritis, unspecified site: Secondary | ICD-10-CM | POA: Diagnosis not present

## 2016-10-24 DIAGNOSIS — M6281 Muscle weakness (generalized): Secondary | ICD-10-CM | POA: Diagnosis not present

## 2016-10-24 DIAGNOSIS — C50912 Malignant neoplasm of unspecified site of left female breast: Secondary | ICD-10-CM | POA: Diagnosis not present

## 2016-10-24 DIAGNOSIS — G40909 Epilepsy, unspecified, not intractable, without status epilepticus: Secondary | ICD-10-CM | POA: Diagnosis not present

## 2016-10-24 DIAGNOSIS — S2232XD Fracture of one rib, left side, subsequent encounter for fracture with routine healing: Secondary | ICD-10-CM | POA: Diagnosis not present

## 2016-10-26 DIAGNOSIS — S2232XD Fracture of one rib, left side, subsequent encounter for fracture with routine healing: Secondary | ICD-10-CM | POA: Diagnosis not present

## 2016-10-26 DIAGNOSIS — M6281 Muscle weakness (generalized): Secondary | ICD-10-CM | POA: Diagnosis not present

## 2016-10-26 DIAGNOSIS — S32009A Unspecified fracture of unspecified lumbar vertebra, initial encounter for closed fracture: Secondary | ICD-10-CM | POA: Diagnosis not present

## 2016-10-26 DIAGNOSIS — M1991 Primary osteoarthritis, unspecified site: Secondary | ICD-10-CM | POA: Diagnosis not present

## 2016-10-26 DIAGNOSIS — G40909 Epilepsy, unspecified, not intractable, without status epilepticus: Secondary | ICD-10-CM | POA: Diagnosis not present

## 2016-10-26 DIAGNOSIS — C50912 Malignant neoplasm of unspecified site of left female breast: Secondary | ICD-10-CM | POA: Diagnosis not present

## 2016-10-28 DIAGNOSIS — S32009A Unspecified fracture of unspecified lumbar vertebra, initial encounter for closed fracture: Secondary | ICD-10-CM | POA: Diagnosis not present

## 2016-10-28 DIAGNOSIS — C50912 Malignant neoplasm of unspecified site of left female breast: Secondary | ICD-10-CM | POA: Diagnosis not present

## 2016-10-28 DIAGNOSIS — G40909 Epilepsy, unspecified, not intractable, without status epilepticus: Secondary | ICD-10-CM | POA: Diagnosis not present

## 2016-10-28 DIAGNOSIS — S2232XD Fracture of one rib, left side, subsequent encounter for fracture with routine healing: Secondary | ICD-10-CM | POA: Diagnosis not present

## 2016-10-28 DIAGNOSIS — M6281 Muscle weakness (generalized): Secondary | ICD-10-CM | POA: Diagnosis not present

## 2016-10-28 DIAGNOSIS — M1991 Primary osteoarthritis, unspecified site: Secondary | ICD-10-CM | POA: Diagnosis not present

## 2016-10-31 DIAGNOSIS — G40909 Epilepsy, unspecified, not intractable, without status epilepticus: Secondary | ICD-10-CM | POA: Diagnosis not present

## 2016-10-31 DIAGNOSIS — C50912 Malignant neoplasm of unspecified site of left female breast: Secondary | ICD-10-CM | POA: Diagnosis not present

## 2016-10-31 DIAGNOSIS — S32009A Unspecified fracture of unspecified lumbar vertebra, initial encounter for closed fracture: Secondary | ICD-10-CM | POA: Diagnosis not present

## 2016-10-31 DIAGNOSIS — M1991 Primary osteoarthritis, unspecified site: Secondary | ICD-10-CM | POA: Diagnosis not present

## 2016-10-31 DIAGNOSIS — M6281 Muscle weakness (generalized): Secondary | ICD-10-CM | POA: Diagnosis not present

## 2016-10-31 DIAGNOSIS — S2232XD Fracture of one rib, left side, subsequent encounter for fracture with routine healing: Secondary | ICD-10-CM | POA: Diagnosis not present

## 2016-11-02 DIAGNOSIS — C50912 Malignant neoplasm of unspecified site of left female breast: Secondary | ICD-10-CM | POA: Diagnosis not present

## 2016-11-02 DIAGNOSIS — S32009A Unspecified fracture of unspecified lumbar vertebra, initial encounter for closed fracture: Secondary | ICD-10-CM | POA: Diagnosis not present

## 2016-11-02 DIAGNOSIS — G40909 Epilepsy, unspecified, not intractable, without status epilepticus: Secondary | ICD-10-CM | POA: Diagnosis not present

## 2016-11-02 DIAGNOSIS — M1991 Primary osteoarthritis, unspecified site: Secondary | ICD-10-CM | POA: Diagnosis not present

## 2016-11-02 DIAGNOSIS — M6281 Muscle weakness (generalized): Secondary | ICD-10-CM | POA: Diagnosis not present

## 2016-11-02 DIAGNOSIS — S2232XD Fracture of one rib, left side, subsequent encounter for fracture with routine healing: Secondary | ICD-10-CM | POA: Diagnosis not present

## 2016-11-03 DIAGNOSIS — G40909 Epilepsy, unspecified, not intractable, without status epilepticus: Secondary | ICD-10-CM | POA: Diagnosis not present

## 2016-11-03 DIAGNOSIS — S32009A Unspecified fracture of unspecified lumbar vertebra, initial encounter for closed fracture: Secondary | ICD-10-CM | POA: Diagnosis not present

## 2016-11-03 DIAGNOSIS — S2232XD Fracture of one rib, left side, subsequent encounter for fracture with routine healing: Secondary | ICD-10-CM | POA: Diagnosis not present

## 2016-11-03 DIAGNOSIS — M1991 Primary osteoarthritis, unspecified site: Secondary | ICD-10-CM | POA: Diagnosis not present

## 2016-11-03 DIAGNOSIS — C50912 Malignant neoplasm of unspecified site of left female breast: Secondary | ICD-10-CM | POA: Diagnosis not present

## 2016-11-03 DIAGNOSIS — M6281 Muscle weakness (generalized): Secondary | ICD-10-CM | POA: Diagnosis not present

## 2016-11-07 DIAGNOSIS — S32009A Unspecified fracture of unspecified lumbar vertebra, initial encounter for closed fracture: Secondary | ICD-10-CM | POA: Diagnosis not present

## 2016-11-07 DIAGNOSIS — M545 Low back pain: Secondary | ICD-10-CM | POA: Diagnosis not present

## 2016-11-24 DIAGNOSIS — Z90722 Acquired absence of ovaries, bilateral: Secondary | ICD-10-CM | POA: Diagnosis not present

## 2016-11-24 DIAGNOSIS — C50412 Malignant neoplasm of upper-outer quadrant of left female breast: Secondary | ICD-10-CM | POA: Diagnosis not present

## 2016-11-24 DIAGNOSIS — Z9079 Acquired absence of other genital organ(s): Secondary | ICD-10-CM | POA: Diagnosis not present

## 2016-11-24 DIAGNOSIS — Z90710 Acquired absence of both cervix and uterus: Secondary | ICD-10-CM | POA: Diagnosis not present

## 2016-11-24 DIAGNOSIS — C50211 Malignant neoplasm of upper-inner quadrant of right female breast: Secondary | ICD-10-CM | POA: Diagnosis not present

## 2016-11-24 DIAGNOSIS — Z8669 Personal history of other diseases of the nervous system and sense organs: Secondary | ICD-10-CM | POA: Diagnosis not present

## 2016-11-24 DIAGNOSIS — Z9049 Acquired absence of other specified parts of digestive tract: Secondary | ICD-10-CM | POA: Diagnosis not present

## 2016-12-05 DIAGNOSIS — S8001XA Contusion of right knee, initial encounter: Secondary | ICD-10-CM | POA: Diagnosis not present

## 2016-12-05 DIAGNOSIS — M25561 Pain in right knee: Secondary | ICD-10-CM | POA: Diagnosis not present

## 2016-12-05 DIAGNOSIS — G8929 Other chronic pain: Secondary | ICD-10-CM | POA: Diagnosis not present

## 2016-12-05 DIAGNOSIS — M545 Low back pain: Secondary | ICD-10-CM | POA: Diagnosis not present

## 2016-12-15 DIAGNOSIS — Z0000 Encounter for general adult medical examination without abnormal findings: Secondary | ICD-10-CM | POA: Diagnosis not present

## 2016-12-15 DIAGNOSIS — S2239XA Fracture of one rib, unspecified side, initial encounter for closed fracture: Secondary | ICD-10-CM | POA: Diagnosis not present

## 2016-12-15 DIAGNOSIS — M549 Dorsalgia, unspecified: Secondary | ICD-10-CM | POA: Diagnosis not present

## 2016-12-15 DIAGNOSIS — R262 Difficulty in walking, not elsewhere classified: Secondary | ICD-10-CM | POA: Diagnosis not present

## 2016-12-15 DIAGNOSIS — R569 Unspecified convulsions: Secondary | ICD-10-CM | POA: Diagnosis not present

## 2016-12-15 DIAGNOSIS — K529 Noninfective gastroenteritis and colitis, unspecified: Secondary | ICD-10-CM | POA: Diagnosis not present

## 2016-12-15 DIAGNOSIS — M5030 Other cervical disc degeneration, unspecified cervical region: Secondary | ICD-10-CM | POA: Diagnosis not present

## 2016-12-15 DIAGNOSIS — S32009A Unspecified fracture of unspecified lumbar vertebra, initial encounter for closed fracture: Secondary | ICD-10-CM | POA: Diagnosis not present

## 2016-12-15 DIAGNOSIS — I440 Atrioventricular block, first degree: Secondary | ICD-10-CM | POA: Diagnosis not present

## 2016-12-15 DIAGNOSIS — Z23 Encounter for immunization: Secondary | ICD-10-CM | POA: Diagnosis not present

## 2016-12-27 ENCOUNTER — Telehealth: Payer: Self-pay

## 2016-12-27 DIAGNOSIS — S8001XA Contusion of right knee, initial encounter: Secondary | ICD-10-CM | POA: Diagnosis not present

## 2016-12-27 DIAGNOSIS — S32009A Unspecified fracture of unspecified lumbar vertebra, initial encounter for closed fracture: Secondary | ICD-10-CM | POA: Diagnosis not present

## 2016-12-27 DIAGNOSIS — M545 Low back pain: Secondary | ICD-10-CM | POA: Diagnosis not present

## 2016-12-27 DIAGNOSIS — G8929 Other chronic pain: Secondary | ICD-10-CM | POA: Diagnosis not present

## 2016-12-27 NOTE — Telephone Encounter (Signed)
Spoke with the patient and she is aware of her new date and time due to out of office  Lane Kjos

## 2017-01-11 ENCOUNTER — Other Ambulatory Visit: Payer: Medicare Other

## 2017-01-11 ENCOUNTER — Ambulatory Visit: Payer: Medicare Other | Admitting: Hematology

## 2017-01-19 DIAGNOSIS — G629 Polyneuropathy, unspecified: Secondary | ICD-10-CM | POA: Diagnosis not present

## 2017-01-19 DIAGNOSIS — G40209 Localization-related (focal) (partial) symptomatic epilepsy and epileptic syndromes with complex partial seizures, not intractable, without status epilepticus: Secondary | ICD-10-CM | POA: Diagnosis not present

## 2017-01-19 DIAGNOSIS — M4712 Other spondylosis with myelopathy, cervical region: Secondary | ICD-10-CM | POA: Diagnosis not present

## 2017-01-19 DIAGNOSIS — M4857XS Collapsed vertebra, not elsewhere classified, lumbosacral region, sequela of fracture: Secondary | ICD-10-CM | POA: Diagnosis not present

## 2017-01-20 NOTE — Progress Notes (Signed)
[place]  Telephone:([phone]) [phone] Fax:(336) [phone]  Clinic Follow Up Note   Patient Care Team: Moshe Cipro, MD as PCP - General (Internal Medicine) Lonzo Cloud, MD (General Practice) Fanny Skates, MD as Consulting Physician (General Surgery) Truitt Merle, MD as Consulting Physician (Hematology) Thea Silversmith, MD (Inactive) as Consulting Physician (Radiation Oncology) Rockwell Germany, RN as Registered Nurse Mauro Kaufmann, RN as Registered Nurse Holley Bouche, NP as Nurse Practitioner (Nurse Practitioner) Bary Leriche, MD as Referring Physician (Psychiatry) Erline Levine, MD as Consulting Physician (Neurosurgery) 01/23/2017  CHIEF COMPLAINTS:  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 Ultrasound of the right axilla is negative      06/05/2014 Initial Biopsy    Right breast core needle bx: invasive mammary carcinoma, ER- (0%), PR- (0%), HER2/neu negative, Ki67: 98%      07/02/2014 PET scan    1. Intensely hypermetabolic mass in the medial RIGHT chest wall with hypermetabolic right axillary node 2. Hypermetabolic nodal metastasis in the LEFT level 2 and LEFT supraclavicular and periportal node (SUV 5).       07/04/2014 Pathology Results    Left breast mass biopsy showed DCIS, ER100%, PR 48%      07/10/2014 Pathology Results    Left [place] node biopsy was negative for malignant cells, benign reactive lymph tissue         07/10/2014 Clinical Stage    RIGHT: Stage IIA: T2 N0   LEFT:  Stage 0: Tis N0      07/18/2014 - 12/19/2014 Chemotherapy    Dose dense Adriamycin and Cytoxan, every 2 weeks, X4, followed by weekly carbo and Taxol for 12 weeks.      07/28/2014 Pathology Results    10R and 10L lymph node biopsy showed no malignant cells, mixed lymphoid population.      08/11/2014 Miscellaneous    nadir CBC: WBC 0.54, hemoglobin 8.9, hematocrit 26%, platelet 125      01/12/2015 Procedure    Breast/Ovarian ([facility]) panel: no clinically sig variant at ATM, BARD1, BRCA1, BRCA2, BRIP1, CDH1, CHEK2, EPCAM, FANCC, MLH1, MSH2, MSH6, NBN, PALB2, PMS2, PTEN, RAD51C, RAD51D, TP53, and XRCC2      01/15/2015 Surgery    Bilateral lumpectomy and right sentinel lymph node biopsy.      01/15/2015 Pathology Results    Bilateral breast lumpectomy showed fibrocystic change, treatment-related changes, no residual malignancy. 3 right axillary sentinel lymph nodes were negative.      02/24/2015 - 04/14/2015 Radiation Therapy    Right breast 45 Gy over 25 fractions; right supraclav fossa/axilla: 45 Gy over 25 fractions; right breast boost: 16 Gy over 8 fractions.  Total dose RIGHT: 61 Gy  Left breast: 50.4 Gy over 28 fractions.      04/2015 -  Anti-estrogen oral therapy    Tamoxifen 20 mg daily      07/02/2015 Survivorship  Survivorship visit completed      01/18/2016 PET scan    PET 01/18/2016 IMPRESSION: 1. Surgical changes in both breasts. No evidence of hypermetabolic residual or recurrent tumor. No explanation for left-sided scapular pain. 2. Low-level hypermetabolism about the right pectoralis musculature is in the region of surgical changes and favored to be treatment related. 3.  Coronary artery atherosclerosis. Aortic atherosclerosis      06/14/2016 Imaging    MM Bilateral Breast 06/14/16 IMPRESSION: No mammographic evidence for malignancy.         HISTORY OF PRESENTING ILLNESS:  Kristin Pennington 71 y.o. female is here because of recently diagnosed poorly differentiated carcinoma in her right breast.  She found a lump in her right breast 3 weeks ago, mild tenderness,  No other complains. Last mammo was in oct 2015 which was negative. She feels well overall. She denies any pain, cough, shortness breast, GI symptoms. She has good energy level and appetite, no recent weight loss.  Her last colonoscopy was for 5 years ago which was negative per patient. Her last Pap smear was 3 years ago which was negative per patient.  CURRERNT THERAPY:  Tamoxifen 20 mg once daily, started on Feb 2017  INTERIM HISTORY:  [name] returns for follow-up. She presents to the clinic today with her husband.  She notes she no longer takes hydrocodone, she uses tramadol for her back pain. Her pain is mostly 1/20 but will increase when she is on her feet all day. She takes it when her pain gets too much. 4 months ago in 08/2016, she was previously taking PT. The back pain worsened after falling on her back and fracturing her rib. She did not need a surgery. She graduated from a wheelchair to uses a cane now. She notes her equilibrium is off which contributed to her fall and her other smaller falls. Her Dilantin was too high.    She notes she had a bone density last year with Dr. Megan Salon her Orthopedic doctor. She is taking Tamoxifen still and tolerating it well. Her hot flashes come and go from her shoulder up. She uses a fan at night and is able to sleep well. She notes her joint aches are usual due to age and not worse with tamoxifen.     MEDICAL HISTORY:  Past Medical History:  Diagnosis Date  . Arthritis    knee  . Breast cancer ([place])    bilateral, right breast mass, cancer both breast, x1 chemo  a week ago.  Marland Kitchen GERD (gastroesophageal reflux disease)    with chemo  . Glaucoma    "early" - eye doctor is watching  . Personal history of radiation therapy   . Pneumonia    2015  . Seizures  ([place])    last 2006  "hereditary" type  . Wears glasses   . Wears partial dentures    top     SURGICAL HISTORY: Past Surgical History:  Procedure Laterality Date  . ABDOMINAL HYSTERECTOMY    . APPENDECTOMY    . BREAST BIOPSY    . BREAST LUMPECTOMY    . BREAST LUMPECTOMY WITH RADIOACTIVE SEED AND SENTINEL LYMPH NODE BIOPSY Right 01/15/2015   Procedure: RIGHT BREAST LUMPECTOMY WITH RADIOACTIVE SEED AND SENTINEL LYMPH NODE BIOPSY;  Surgeon: Fanny Skates, MD;  Location: [facility];  Service: General;  Laterality: Right;  . BREAST LUMPECTOMY WITH RADIOACTIVE SEED LOCALIZATION Left 01/15/2015   Procedure: LEFT BREAST LUMPECTOMY WITH RADIOACTIVE SEED LOCALIZATION;  Surgeon: Fanny Skates, MD;  Location: [facility];  Service: General;  Laterality: Left;  . CARPAL TUNNEL RELEASE Left   . CHOLECYSTECTOMY    . COLONOSCOPY    . ENDOBRONCHIAL ULTRASOUND Bilateral 07/28/2014   Procedure: ENDOBRONCHIAL ULTRASOUND;  Surgeon: Collene Gobble, MD;  Location: [facility];  Service: Cardiopulmonary;  Laterality: Bilateral;  . PORT-A-CATH REMOVAL Left 01/15/2015   Procedure: REMOVAL PORT-A-CATH;  Surgeon: Fanny Skates, MD;  Location: [facility];  Service: General;  Laterality: Left;  . PORTACATH PLACEMENT N/A 06/30/2014   Procedure: INSERTION PORT-A-CATH WITH ULTRA SOUND ON STANDBY;  Surgeon: Fanny Skates, MD;  Location: [facility];  Service: General;  Laterality: N/A;  . TONSILLECTOMY     SOCIAL HISTORY: Social History   Social History  . Marital status: Married    Spouse name: N/A  . Number of children: N/A  . Years of education: N/A   Occupational History  . Not on file.   Social History Main Topics  . Smoking status: Former Smoker    Packs/day: 1.00    Years: 18.00    Types: Cigarettes    Quit date: 11/08/1988  . Smokeless tobacco: Never Used  . Alcohol use No  . Drug use: No  . Sexual activity: Not on file   Other Topics Concern  . Not on file   Social History Narrative  . No  narrative on file     GYN HISTORY  Menarchal: 11 LMP: 1994, hysrectomy  Contraceptive: no  HRT: since 1994  G2P1, one miscarriage     FAMILY HISTORY: Family History  Problem Relation Age of Onset  . Stomach cancer Father 62  . Lung cancer Sister 57       not sure if primary or breast cancer met  . Breast cancer Maternal Aunt 72  . Breast cancer Cousin 2  . Heart Problems Paternal Aunt   . Congestive Heart Failure Mother     ALLERGIES:  is allergic to aspirin; codeine; diclofenac; meloxicam; nitrofurantoin; other; pyridium [phenazopyridine hcl]; sulfa antibiotics; tetracyclines & related; and tape.  MEDICATIONS:  Current Outpatient Prescriptions  Medication Sig Dispense Refill  . aspirin EC 81 MG tablet Take 81 mg by mouth daily.     . Calcium Carb-Cholecalciferol (CALCIUM 600+D3) 600-400 MG-UNIT TABS Take 1 tablet by mouth 2 (two) times daily.    . clotrimazole-betamethasone (LOTRISONE) cream     . cyanocobalamin (,VITAMIN B-12,) 1000 MCG/ML injection Inject 1,000 mcg into the muscle every 30 (thirty) days.     . diclofenac (VOLTAREN) 50 MG EC tablet Take 50 mg by mouth 2 (two) times daily.    Marland Kitchen levETIRAcetam (KEPPRA) 500 MG tablet Take 1 tablet by mouth 2 (two) times daily.    Marland Kitchen oxaprozin (DAYPRO) 600 MG tablet Take 1 tablet by mouth 2 (two) times daily. Reported on 07/27/2015    . phenytoin (DILANTIN) 100 MG ER capsule Take 1 tablet by mouth 4 (four) times daily. On  Sat , Sun, Tues, Wed, and  Thursdays    -  Take   168m  QID.    .Marland Kitchensodium chloride (OCEAN) 0.65 % SOLN nasal spray Place 1 spray into both nostrils as needed for congestion.    . tamoxifen (NOLVADEX) 20 MG tablet Take 1 tablet (20 mg total) by mouth daily. 90 tablet 3  . Tetrahydrozoline HCl ([facility] EYE DROPS OP) Apply 1 drop to eye 3 (three) times daily.    . traMADol (ULTRAM) 50 MG tablet Take 50 mg by mouth every 6 (six)  hours as needed.  0   No current facility-administered medications for this visit.      REVIEW OF SYSTEMS:   Constitutional: Denies fevers (+) on and off hot flashes Eyes: Denies blurriness of vision, double vision or watery eyes Ears, nose, mouth, throat, and face: Denies mucositis or sore throat Respiratory: Denies cough, dyspnea or wheezes Cardiovascular: Denies palpitation, chest discomfort or lower extremity swelling Gastrointestinal:  Denies nausea, heartburn or change in bowel habits  Skin: Denies abnormal skin rashes Lymphatics: Denies new lymphadenopathy or easy bruising MSK: (+) back pain (1/10)  Neurological:Denies numbness, tingling or new weaknesses Behavioral/Psych: Mood is stable, no new changes  All other systems were reviewed with the patient and are negative.  PHYSICAL EXAMINATION: ECOG PERFORMANCE STATUS: 1  BP 124/63 (BP Location: Left Arm, Patient Position: Sitting)   Pulse 72   Temp 98 F (36.7 C) (Oral)   Resp 17   Ht _0  (1.676 m)   Wt 162 lb (73.5 kg)   SpO2 98%   BMI 26.15 kg/m    GENERAL:alert, no distress and comfortable SKIN: skin color, texture, turgor are normal, no rashes or significant lesions EYES: normal, conjunctiva are pink and non-injected, sclera clear OROPHARYNX:no exudate, no erythema and lips, buccal mucosa, and tongue normal  NECK: supple, thyroid normal size, non-tender, without nodularity LYMPH:  no palpable lymphadenopathy in the cervical, axillary or inguinal LUNGS: clear to auscultation and percussion with normal breathing effort HEART: regular rate & rhythm and no murmurs and no lower extremity edema ABDOMEN:abdomen soft, non-tender and normal bowel sounds Musculoskeletal:no cyanosis of digits and no clubbing, no significant right arm swelling PSYCH: alert & oriented x 3 with fluent speech NEURO: no focal motor/sensory deficits Breasts: Breast inspection showed them to be symmetrical with no nipple discharge.  (+) Surgical incision site in UOQ both breasts have healed well, no significant scar or nodule, no  significant skin pigmentation or edema. No other palpable mass or adenopathy.  LABORATORY DATA:  I have reviewed the data as listed CBC Latest Ref Rng & Units 01/23/2017 08/10/2016 04/22/2016  WBC 3.9 - 10.3 10e3/uL 3.3(L) 4.4 3.9  Hemoglobin 11.6 - 15.9 g/dL 12.8 12.6 13.0  Hematocrit 34.8 - 46.6 % 38.8 36.7 38.7  Platelets 145 - 400 10e3/uL 186 243 276    CMP Latest Ref Rng & Units 08/10/2016 04/22/2016 11/27/2015  Glucose 70 - 140 mg/dl 93 85 110  BUN 7.0 - 26.0 mg/dL 15.2 21.7 17.1  Creatinine 0.6 - 1.1 mg/dL 0.8 0.8 0.8  Sodium 136 - 145 mEq/L 142 140 141  Potassium 3.5 - 5.1 mEq/L 3.9 4.0 4.0  Chloride 96 - 112 mmol/L - - -  CO2 22 - 29 mEq/L _1 Calcium 8.4 - 10.4 mg/dL 8.9 9.4 9.0  Total Protein 6.4 - 8.3 g/dL 6.9 7.4 7.2  Total Bilirubin 0.20 - 1.20 mg/dL <0.22 <0.22 <0.30  Alkaline Phos 40 - 150 U/L 80 78 92  AST 5 - 34 U/L _2 ALT 0 - 55 U/L _3 Her outside CBC and CMP were unremarkable on 01/14/2016   PATHOLOGY REPORT 06/05/2014 Diagnosis 01/15/2015 1. Breast, lumpectomy, Left - FIBROCYSTIC CHANGE WITH ADENOSIS AND CALCIFICATIONS. - TREATMENT RELATED CHANGES. - THERE IS NO EVIDENCE OF MALIGNANCY. - SEE COMMENT. 2. Breast, lumpectomy, Right - FIBROCYSTIC CHANGES WITH ADENOSIS. - TREATMENT RELATED CHANGES. - THERE IS NO EVIDENCE OF MALIGNANCY. - SEE COMMENT. 3. Lymph node, sentinel, biopsy, right axillary #1 -  THERE IS NO EVIDENCE OF CARCINOMA IN 1 OF 1 LYMPH NODE (0/1). - SEE COMMENT 4. Lymph node, sentinel, biopsy, right axillary #2 - THERE IS NO EVIDENCE OF CARCINOMA IN 1 OF 1 LYMPH NODE (0/1). - SEE COMMENT. 5. Lymph node, sentinel, biopsy, right axillary #3 - THERE IS NO EVIDENCE OF CARCINOMA IN 1 OF 1 LYMPH NODE (0/1). - SEE COMMENT.  Microscopic Comment 1. Carcinoma is not identified in the current specimen. The surgical resection margin(s) of the specimen were inked and microscopically evaluated. 2. Carcinoma is not identified in  the current specimen. The surgical resection margin(s) of the specimen were inked and microscopically evaluated. 3. -5. There are no significant treatment related changes present in the histologically evaluated sections of the lymph nodes. (JBK:kh 01-19-15)  Diagnosis  07/04/2014 Breast, left, needle core biopsy, UOQ - DUCTAL CARCINOMA IN-SITU. - SEE COMMENT.  Microscopic Comment The ductal carcinoma in situ is low grade. The carcinoma cells are positive for e-cadherin and cytokeratin AE1/AE3. Smooth muscle myosin, calponin, and p63 stains highlight the presence of myoepithelial cells. Estrogen receptor and progesterone receptor studies will be performed and the results reported separately. The results were called to [facility] on 07-08-2014. IMMUNOHISTOCHEMICAL AND MORPHOMETRIC ANALYSIS BY THE AUTOMATED CELLULAR IMAGING SYSTEM (ACIS) Estrogen Receptor: 100%, POSITIVE, STRONG STAINING INTENSITY Progesterone Receptor: 28%, POSITIVE, WEAK STAINING INTENSITY   RADIOGRAPHIC STUDIES: I have personally reviewed the radiological images as listed and agreed with the findings in the report.  MM Bilateral Breast 06/14/16 IMPRESSION: No mammographic evidence for malignancy.   PET 01/18/2016 IMPRESSION: 1. Surgical changes in both breasts. No evidence of hypermetabolic residual or recurrent tumor. No explanation for left-sided scapular pain. 2. Low-level hypermetabolism about the right pectoralis musculature is in the region of surgical changes and favored to be treatment related. 3.  Coronary artery atherosclerosis. Aortic atherosclerosis.   Diagnostic mammogram b/l 06/12/2015 IMPRESSION: No mammographic evidence of malignancy in either breast.  RECOMMENDATION: Diagnostic mammogram is suggested in 1 year. (Code:DM-B-01Y)    ASSESSMENT & PLAN:  71 y.o. postmenopausal woman, with past medical history of seizure and arthritis, presented with a palpable mass in the  inner upper quadrant of her right breast. Biopsy revealed poorly differentiated carcinoma, triple negative.  1. Poorly differentiated mammary carcinoma in her right breast, cT2NxM0, triple negative, ypT0N0 - she received neoadjuvant chemotherapy , with complete pathological response - she also completed adjuvant breast irradiation, and tolerated well, - we discussed the surveillance plan,  Which includes annual screening mammogram, self-exam and follow-up visit every 3-4 months for the first 2 years, and every 6 months afterwards for additional 3 years. -Her annual screening mammogram in March 2017 was normal. -Lab previously reviewed with her, CBC and CMP are normal, her exam was unremarkable today.  -Her last PET scan in 12/2015 was negative for metastatic lesions, I don't think she needs more surveillance CT or PET scan. -We'll continue surveillance. -She is clinically doing well,  lab reviewed, exam was unremarkable, no clinical concern for recurrence. -Continue breast cancer surveillance.  -F/u in 6 months  2. Left breast DCIS,  ER+/PR+ -The left breast DCIS has no residual tumor on the surgical sample, it was ER/PR strongly positive. -she is on chemoprevention with tamoxifen, tolerating well except mild hot flash.  3. Cervical spine stenosis -She is seeing her orthopedic surgeon Dr. Megan Salon and neurosurgeon Dr. sternum -Her outside MRI showed severe degenerative disc disease of the cervical spine with moderate to severe stenosis -She did  physical therapy, and will follow up with neurosurgeon Dr. Vertell Limber  4. Hot flush -Secondary to tamoxifen. -Tolerable, patient previously declined medication. We'll continue monitoring.    5. Bone Health  -Had Bone density in 2017 with Dr. Megan Salon her Orthopedist, patient notes results were normal  -Will have next scan in 2019  6. Back Pain after a fall in 08/2016 [place] on back due to equilibrium imbalance in 08/2016 -Overall much improved, she  is off walker now, uses a crane. Uses Tramadol as needed   7. Genetic -She has multiple family members had breast cancer -her genetic testing was negative for the following 20 genes: ATM, BARD1, BRCA1, BRCA2, BRIP1, CDH1, CHEK2, EPCAM, FANCC, MLH1, MSH2, MSH6, NBN, PALB2, PMS2, PTEN, RAD51C, RAD51D, TP53, and XRCC2  8. History of seizure -on dilantin, dose being adjusted lately   Plan: -continue tamoxifen for breast cancer chemoprevention -lab and f/u in 6 months -Mammogram in 05/2017 at [facility]    All questions were answered. The patient knows to call the clinic with any problems, questions or concerns.  I spent 25 minutes counseling the patient face to face. The total time spent in the appointment was 30 minutes and more than 50% was on counseling.  This document serves as a record of services personally performed by Truitt Merle, MD. It was created on her behalf by Joslyn Devon, a trained medical scribe. The creation of this record is based on the scribe's personal observations and the provider's statements to them. This document has been checked and approved by the attending provider.    Truitt Merle, MD 01/23/2017 8:58 AM

## 2017-01-23 ENCOUNTER — Ambulatory Visit (HOSPITAL_BASED_OUTPATIENT_CLINIC_OR_DEPARTMENT_OTHER): Payer: Medicare Other | Admitting: Hematology

## 2017-01-23 ENCOUNTER — Other Ambulatory Visit (HOSPITAL_BASED_OUTPATIENT_CLINIC_OR_DEPARTMENT_OTHER): Payer: Medicare Other

## 2017-01-23 ENCOUNTER — Encounter: Payer: Self-pay | Admitting: Hematology

## 2017-01-23 ENCOUNTER — Telehealth: Payer: Self-pay | Admitting: Hematology

## 2017-01-23 VITALS — BP 124/63 | HR 72 | Temp 98.0°F | Resp 17 | Ht 66.0 in | Wt 162.0 lb

## 2017-01-23 DIAGNOSIS — Z171 Estrogen receptor negative status [ER-]: Secondary | ICD-10-CM

## 2017-01-23 DIAGNOSIS — D0512 Intraductal carcinoma in situ of left breast: Secondary | ICD-10-CM

## 2017-01-23 DIAGNOSIS — C50211 Malignant neoplasm of upper-inner quadrant of right female breast: Secondary | ICD-10-CM

## 2017-01-23 LAB — CBC WITH DIFFERENTIAL/PLATELET
BASO%: 0.3 % (ref 0.0–2.0)
Basophils Absolute: 0 10*3/uL (ref 0.0–0.1)
EOS%: 9.1 % — ABNORMAL HIGH (ref 0.0–7.0)
Eosinophils Absolute: 0.3 10*3/uL (ref 0.0–0.5)
HCT: 38.8 % (ref 34.8–46.6)
HGB: 12.8 g/dL (ref 11.6–15.9)
LYMPH%: 25.4 % (ref 14.0–49.7)
MCH: 32.2 pg (ref 25.1–34.0)
MCHC: 33 g/dL (ref 31.5–36.0)
MCV: 97.7 fL (ref 79.5–101.0)
MONO#: 0.4 10*3/uL (ref 0.1–0.9)
MONO%: 10.9 % (ref 0.0–14.0)
NEUT#: 1.8 10*3/uL (ref 1.5–6.5)
NEUT%: 54.3 % (ref 38.4–76.8)
Platelets: 186 10*3/uL (ref 145–400)
RBC: 3.97 10*6/uL (ref 3.70–5.45)
RDW: 12.3 % (ref 11.2–14.5)
WBC: 3.3 10*3/uL — ABNORMAL LOW (ref 3.9–10.3)
lymph#: 0.8 10*3/uL — ABNORMAL LOW (ref 0.9–3.3)

## 2017-01-23 LAB — COMPREHENSIVE METABOLIC PANEL
ALT: 11 U/L (ref 0–55)
AST: 13 U/L (ref 5–34)
Albumin: 3.1 g/dL — ABNORMAL LOW (ref 3.5–5.0)
Alkaline Phosphatase: 71 U/L (ref 40–150)
Anion Gap: 8 mEq/L (ref 3–11)
BUN: 15.1 mg/dL (ref 7.0–26.0)
CO2: 28 mEq/L (ref 22–29)
Calcium: 8.9 mg/dL (ref 8.4–10.4)
Chloride: 105 mEq/L (ref 98–109)
Creatinine: 0.7 mg/dL (ref 0.6–1.1)
EGFR: >60 mL/min/{1.73_m2} (ref >60)
Glucose: 79 mg/dl (ref 70–140)
Potassium: 4.3 mEq/L (ref 3.5–5.1)
Sodium: 141 mEq/L (ref 136–145)
Total Bilirubin: <0.22 mg/dL (ref 0.20–1.20)
Total Protein: 6.5 g/dL (ref 6.4–8.3)

## 2017-01-23 NOTE — Telephone Encounter (Signed)
Gave patient avs and calendar with appts per 10/29 los. °

## 2017-01-31 DIAGNOSIS — M25571 Pain in right ankle and joints of right foot: Secondary | ICD-10-CM | POA: Diagnosis not present

## 2017-01-31 DIAGNOSIS — M722 Plantar fascial fibromatosis: Secondary | ICD-10-CM | POA: Diagnosis not present

## 2017-03-09 DIAGNOSIS — T420X1D Poisoning by hydantoin derivatives, accidental (unintentional), subsequent encounter: Secondary | ICD-10-CM | POA: Diagnosis not present

## 2017-03-09 DIAGNOSIS — M8580 Other specified disorders of bone density and structure, unspecified site: Secondary | ICD-10-CM | POA: Diagnosis not present

## 2017-03-09 DIAGNOSIS — C50919 Malignant neoplasm of unspecified site of unspecified female breast: Secondary | ICD-10-CM | POA: Diagnosis not present

## 2017-03-09 DIAGNOSIS — S32009S Unspecified fracture of unspecified lumbar vertebra, sequela: Secondary | ICD-10-CM | POA: Diagnosis not present

## 2017-03-09 DIAGNOSIS — R569 Unspecified convulsions: Secondary | ICD-10-CM | POA: Diagnosis not present

## 2017-04-03 ENCOUNTER — Other Ambulatory Visit: Payer: Self-pay | Admitting: *Deleted

## 2017-04-03 ENCOUNTER — Telehealth: Payer: Self-pay | Admitting: *Deleted

## 2017-04-03 DIAGNOSIS — C50211 Malignant neoplasm of upper-inner quadrant of right female breast: Secondary | ICD-10-CM

## 2017-04-03 DIAGNOSIS — Z170 Estrogen receptor positive status [ER+]: Principal | ICD-10-CM

## 2017-04-03 MED ORDER — TAMOXIFEN CITRATE 20 MG PO TABS: 20.0000 mg | ORAL_TABLET | Freq: Every day | ORAL | 2 refills | Status: DC

## 2017-04-03 MED ORDER — TAMOXIFEN CITRATE 20 MG PO TABS: 20.0000 mg | ORAL_TABLET | Freq: Every day | ORAL | 0 refills | Status: DC

## 2017-04-03 NOTE — Telephone Encounter (Signed)
Pt called requesting refill of Tamoxifen to be sent to [facility].  Spoke with pt, and was informed that pt had changed to [employer], and will not be able to use [facility] pharmacy.   Pt requested a 30 day supply [facility] [place] [place].    The 90 day supply Tamoxifen needs to be sent to [facility] . [employer]     [phone]        ;      Phone       [phone].

## 2017-06-15 ENCOUNTER — Ambulatory Visit
Admission: RE | Admit: 2017-06-15 | Discharge: 2017-06-15 | Disposition: A | Discharge status: Home / Self Care | Payer: Medicare Other | Source: Ambulatory Visit | Attending: Hematology | Admitting: Hematology

## 2017-06-15 DIAGNOSIS — D0512 Intraductal carcinoma in situ of left breast: Secondary | ICD-10-CM

## 2017-06-15 DIAGNOSIS — C50211 Malignant neoplasm of upper-inner quadrant of right female breast: Secondary | ICD-10-CM

## 2017-06-15 DIAGNOSIS — Z171 Estrogen receptor negative status [ER-]: Secondary | ICD-10-CM

## 2017-07-22 NOTE — Progress Notes (Signed)
[facility]  [phone] [phone]) [phone]  Clinic Follow Up Note   Patient Care Team: Moshe Cipro, MD as PCP - General (Internal Medicine) Lonzo Cloud, MD (General Practice) Fanny Skates, MD as Consulting Physician (General Surgery) Truitt Merle, MD as Consulting Physician (Hematology) Thea Silversmith, MD (Inactive) as Consulting Physician (Radiation Oncology) Rockwell Germany, RN as Registered Nurse Mauro Kaufmann, RN as Registered Nurse Holley Bouche, NP as Nurse Practitioner (Nurse Practitioner) Bary Leriche, MD as Referring Physician (Psychiatry) Erline Levine, MD as Consulting Physician (Neurosurgery) 07/24/2017  CHIEF COMPLAINTS:  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 Ultrasound of the right axilla is negative      06/05/2014 Initial Biopsy    Right breast core needle bx: invasive mammary carcinoma, ER- (0%), PR- (0%), HER2/neu negative, Ki67: 98%      07/02/2014 PET scan    1. Intensely hypermetabolic mass in the medial RIGHT chest wall with hypermetabolic right axillary node 2. Hypermetabolic nodal metastasis in the LEFT level 2 and LEFT supraclavicular and periportal node (SUV 5).       07/04/2014 Pathology Results    Left breast mass biopsy showed DCIS, ER100%, PR 48%      07/10/2014 Pathology Results    Left [place] node biopsy was negative for malignant cells, benign reactive lymph tissue       07/10/2014 Clinical Stage    RIGHT: Stage IIA: T2 N0   LEFT:  Stage 0: Tis N0      07/18/2014 - 12/19/2014 Chemotherapy    Dose dense Adriamycin and Cytoxan, every 2 weeks, X4, followed by weekly carbo and Taxol for 12 weeks.      07/28/2014 Pathology Results    10R and 10L lymph node biopsy showed no malignant cells, mixed lymphoid population.      08/11/2014 Miscellaneous    nadir CBC: WBC 0.54, hemoglobin 8.9, hematocrit 26%, platelet 125      01/12/2015 Procedure    Breast/Ovarian ([facility]) panel: no clinically sig variant at ATM, BARD1, BRCA1, BRCA2, BRIP1, CDH1, CHEK2, EPCAM, FANCC, MLH1, MSH2, MSH6, NBN, PALB2, PMS2, PTEN, RAD51C, RAD51D, TP53, and XRCC2      01/15/2015 Surgery    Bilateral lumpectomy and right sentinel lymph node biopsy.      01/15/2015 Pathology Results    Bilateral breast lumpectomy showed fibrocystic change, treatment-related changes, no residual malignancy. 3 right axillary sentinel lymph nodes were negative.      02/24/2015 - 04/14/2015 Radiation Therapy    Right breast 45 Gy over 25 fractions; right supraclav fossa/axilla: 45 Gy over 25 fractions; right breast boost: 16 Gy over 8 fractions.  Total dose RIGHT: 61 Gy  Left breast: 50.4 Gy over 28 fractions.      04/2015 -  Anti-estrogen oral therapy    Tamoxifen 20 mg daily      07/02/2015 Survivorship    Survivorship  visit completed      01/18/2016 PET scan    PET 01/18/2016 IMPRESSION: 1. Surgical changes in both breasts. No evidence of hypermetabolic residual or recurrent tumor. No explanation for left-sided scapular pain. 2. Low-level hypermetabolism about the right pectoralis musculature is in the region of surgical changes and favored to be treatment related. 3.  Coronary artery atherosclerosis. Aortic atherosclerosis      06/14/2016 Imaging    MM Bilateral Breast 06/14/16 IMPRESSION: No mammographic evidence for malignancy.        06/15/2017 Mammogram    IMPRESSION: No evidence  of malignancy within either breast. Stable postsurgical changes within each breast.       HISTORY OF PRESENTING ILLNESS:  Kristin Pennington 72 y.o. female is here because of recently diagnosed poorly differentiated carcinoma in her right breast.  She found a lump in her right breast 3 weeks ago, mild tenderness,  No other complains. Last mammo was in oct 2015 which was negative. She feels well overall. She denies any pain, cough, shortness breast, GI symptoms. She has good energy level and appetite, no recent weight loss.  Her last colonoscopy was for 5 years ago which was negative per patient. Her last Pap smear was 3 years ago which was negative per patient.  CURRERNT THERAPY:  Tamoxifen 20 mg once daily, started on Feb 2017  INTERIM HISTORY:  Kristin Pennington returns for follow-up. She presents to the clinic today by herself. She reports she is doing well overall. She is compliant with a Tamoxifen and reports a mild hot flash that is managable. She does have arthritis and she is being followed by an orthopedist. She states she has a blister on the back of her left calf that resolves. She reports no new medical problems.   On review of systems, pt denies chest discomfort, new pain or any other complaints at this time. Pertinent positives are listed and detailed within the above HPI.   MEDICAL HISTORY:  Past Medical History:  Diagnosis Date  . Arthritis    knee  . Breast cancer ([place])    bilateral, right breast mass, cancer both breast, x1 chemo  a week ago.  Marland Kitchen GERD (gastroesophageal reflux disease)    with chemo  . Glaucoma    "early" - eye doctor is watching  . Personal history of radiation therapy   . Pneumonia    2015  . 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 Wears glasses   . Wears partial dentures    top     SURGICAL HISTORY: Past Surgical History:  Procedure Laterality Date  . ABDOMINAL HYSTERECTOMY    . APPENDECTOMY    . BREAST BIOPSY    . BREAST LUMPECTOMY  Bilateral 2016  . BREAST LUMPECTOMY WITH RADIOACTIVE SEED AND SENTINEL LYMPH NODE BIOPSY Right 01/15/2015   Procedure: RIGHT BREAST LUMPECTOMY WITH RADIOACTIVE SEED AND SENTINEL LYMPH NODE BIOPSY;  Surgeon: Fanny Skates, MD;  Location: [facility];  Service: General;  Laterality: Right;  . BREAST LUMPECTOMY WITH RADIOACTIVE SEED LOCALIZATION Left 01/15/2015   Procedure: LEFT BREAST LUMPECTOMY WITH RADIOACTIVE SEED LOCALIZATION;  Surgeon: Fanny Skates, MD;  Location: [facility];  Service: General;  Laterality: Left;  . CARPAL TUNNEL RELEASE Left   . CHOLECYSTECTOMY    . COLONOSCOPY    . ENDOBRONCHIAL ULTRASOUND Bilateral 07/28/2014   Procedure: ENDOBRONCHIAL ULTRASOUND;  Surgeon: Collene Gobble, MD;  Location: [facility];  Service: Cardiopulmonary;  Laterality: Bilateral;  . PORT-A-CATH REMOVAL Left 01/15/2015  Procedure: REMOVAL PORT-A-CATH;  Surgeon: Fanny Skates, MD;  Location: [facility];  Service: General;  Laterality: Left;  . PORTACATH PLACEMENT N/A 06/30/2014   Procedure: INSERTION PORT-A-CATH WITH ULTRA SOUND ON STANDBY;  Surgeon: Fanny Skates, MD;  Location: [facility];  Service: General;  Laterality: N/A;  . TONSILLECTOMY     SOCIAL HISTORY: Social History   Socioeconomic History  . Marital status: Married    Spouse name: Not on file  . Number of children: Not on file  . Years of education: Not on file  . Highest education level: Not on file  Occupational History  . Not on file  Social Needs  . Financial resource strain: Not on file  . Food insecurity:    Worry: Not on file    Inability: Not on file  . Transportation needs:    Medical: Not on file    Non-medical: Not on file  Tobacco Use  . Smoking status: Former Smoker    Packs/day: 1.00    Years: 18.00    Pack years: 18.00    Types: Cigarettes    Last attempt to quit: 11/08/1988    Years since quitting: 28.7  . Smokeless tobacco: Never Used  Substance and Sexual Activity  . Alcohol use: No  . Drug use:  No  . Sexual activity: Not on file  Lifestyle  . Physical activity:    Days per week: Not on file    Minutes per session: Not on file  . Stress: Not on file  Relationships  . Social connections:    Talks on phone: Not on file    Gets together: Not on file    Attends religious service: Not on file    Active member of club or organization: Not on file    Attends meetings of clubs or organizations: Not on file    Relationship status: Not on file  . Intimate partner violence:    Fear of current or ex partner: Not on file    Emotionally abused: Not on file    Physically abused: Not on file    Forced sexual activity: Not on file  Other Topics Concern  . Not on file  Social History Narrative  . Not on file     GYN HISTORY  Menarchal: 11 LMP: 1994, hysrectomy  Contraceptive: no  HRT: since 1994  G2P1, one miscarriage     FAMILY HISTORY: Family History  Problem Relation Age of Onset  . Stomach cancer Father 2  . Lung cancer Sister 70       not sure if primary or breast cancer met  . Breast cancer Maternal Aunt 72  . Breast cancer Cousin 62  . Heart Problems Paternal Aunt   . Congestive Heart Failure Mother     ALLERGIES:  is allergic to aspirin; codeine; meloxicam; nitrofurantoin; other; pyridium [phenazopyridine hcl]; sulfa antibiotics; tetracyclines & related; and tape.  MEDICATIONS:  Current Outpatient Medications  Medication Sig Dispense Refill  . aspirin EC 81 MG tablet Take 81 mg by mouth daily.     . Calcium Carb-Cholecalciferol (CALCIUM 600+D3) 600-400 MG-UNIT TABS Take 1 tablet by mouth 2 (two) times daily.    . clotrimazole-betamethasone (LOTRISONE) cream     . cyanocobalamin (,VITAMIN B-12,) 1000 MCG/ML injection Inject 1,000 mcg into the muscle every 30 (thirty) days.     Marland Kitchen levETIRAcetam (KEPPRA) 500 MG tablet Take 1 tablet by mouth 2 (two) times daily.    Marland Kitchen oxaprozin (DAYPRO) 600 MG tablet  Take 1 tablet by mouth 2 (two) times daily. Reported on 07/27/2015     . phenytoin (DILANTIN) 100 MG ER capsule Take 1 tablet by mouth 4 (four) times daily. On  Sat , Sun, Tues, Wed, and  Thursdays    -  Take   166m  QID.    .Marland Kitchensodium chloride (OCEAN) 0.65 % SOLN nasal spray Place 1 spray into both nostrils as needed for congestion.    . tamoxifen (NOLVADEX) 20 MG tablet Take 1 tablet (20 mg total) by mouth daily. 90 tablet 2  . Tetrahydrozoline HCl ([employer] EYE DROPS OP) Apply 1 drop to eye 3 (three) times daily.    . diclofenac (VOLTAREN) 50 MG EC tablet Take 50 mg by mouth 2 (two) times daily.     No current facility-administered medications for this visit.     REVIEW OF SYSTEMS:   Constitutional: Denies fevers (+) on and off hot flashes Eyes: Denies blurriness of vision, double vision or watery eyes Ears, nose, mouth, throat, and face: Denies mucositis or sore throat Respiratory: Denies cough, dyspnea or wheezes Cardiovascular: Denies palpitation, chest discomfort or lower extremity swelling Gastrointestinal:  Denies nausea, heartburn or change in bowel habits  Skin: Denies abnormal skin rashes Lymphatics: Denies new lymphadenopathy or easy bruising MSK: (+) arthritis  Neurological:Denies numbness, tingling or new weaknesses Behavioral/Psych: Mood is stable, no new changes  All other systems were reviewed with the patient and are negative.  PHYSICAL EXAMINATION: ECOG PERFORMANCE STATUS: 1  BP 128/73 (BP Location: Left Arm, Patient Position: Sitting)   Pulse 65   Temp 97.9 F (36.6 C) (Oral)   Resp 18   Ht '5\' 6"'  (1.676 m)   Wt 169 lb 14.4 oz (77.1 kg)   SpO2 100%   BMI 27.42 kg/m    GENERAL:alert, no distress and comfortable SKIN: skin color, texture, turgor are normal, no rashes or significant lesions EYES: normal, conjunctiva are pink and non-injected, sclera clear OROPHARYNX:no exudate, no erythema and lips, buccal mucosa, and tongue normal  NECK: supple, thyroid normal size, non-tender, without nodularity LYMPH:  no palpable  lymphadenopathy in the cervical, axillary or inguinal LUNGS: clear to auscultation and percussion with normal breathing effort HEART: regular rate & rhythm and no murmurs and no lower extremity edema ABDOMEN:abdomen soft, non-tender and normal bowel sounds Musculoskeletal:no cyanosis of digits and no clubbing, no significant right arm swelling PSYCH: alert & oriented x 3 with fluent speech NEURO: no focal motor/sensory deficits Breasts: Breast inspection showed them to be symmetrical with no nipple discharge.  (+) Surgical incision site in UOQ both breasts have healed well, no significant scar or nodule, no significant skin pigmentation or edema. No other palpable mass or adenopathy.   LABORATORY DATA:  I have reviewed the data as listed CBC Latest Ref Rng & Units 07/24/2017 01/23/2017 08/10/2016  WBC 3.9 - 10.3 K/uL 4.9 3.3(L) 4.4  Hemoglobin 11.6 - 15.9 g/dL 12.9 12.8 12.6  Hematocrit 34.8 - 46.6 % 38.4 38.8 36.7  Platelets 145 - 400 K/uL 206 186 243    CMP Latest Ref Rng & Units 07/24/2017 01/23/2017 08/10/2016  Glucose 70 - 140 mg/dL 84 79 93  BUN 7 - 26 mg/dL 15 15.1 15.2  Creatinine 0.60 - 1.10 mg/dL 0.72 0.7 0.8  Sodium 136 - 145 mmol/L 139 141 142  Potassium 3.5 - 5.1 mmol/L 4.0 4.3 3.9  Chloride 98 - 109 mmol/L 104 - -  CO2 22 - 29 mmol/L '27 28 29  ' Calcium 8.4 -  10.4 mg/dL 8.9 8.9 8.9  Total Protein 6.4 - 8.3 g/dL 6.6 6.5 6.9  Total Bilirubin 0.2 - 1.2 mg/dL <0.2(L) <0.22 <0.22  Alkaline Phos 40 - 150 U/L 76 71 80  AST 5 - 34 U/L '15 13 14  ' ALT 0 - 55 U/L '12 11 12    ' Her outside CBC and CMP were unremarkable on 01/14/2016   PATHOLOGY REPORT 06/05/2014 Diagnosis 01/15/2015 1. Breast, lumpectomy, Left - FIBROCYSTIC CHANGE WITH ADENOSIS AND CALCIFICATIONS. - TREATMENT RELATED CHANGES. - THERE IS NO EVIDENCE OF MALIGNANCY. - SEE COMMENT. 2. Breast, lumpectomy, Right - FIBROCYSTIC CHANGES WITH ADENOSIS. - TREATMENT RELATED CHANGES. - THERE IS NO EVIDENCE OF MALIGNANCY. -  SEE COMMENT. 3. Lymph node, sentinel, biopsy, right axillary #1 - THERE IS NO EVIDENCE OF CARCINOMA IN 1 OF 1 LYMPH NODE (0/1). - SEE COMMENT 4. Lymph node, sentinel, biopsy, right axillary #2 - THERE IS NO EVIDENCE OF CARCINOMA IN 1 OF 1 LYMPH NODE (0/1). - SEE COMMENT. 5. Lymph node, sentinel, biopsy, right axillary #3 - THERE IS NO EVIDENCE OF CARCINOMA IN 1 OF 1 LYMPH NODE (0/1). - SEE COMMENT.  Microscopic Comment 1. Carcinoma is not identified in the current specimen. The surgical resection margin(s) of the specimen were inked and microscopically evaluated. 2. Carcinoma is not identified in the current specimen. The surgical resection margin(s) of the specimen were inked and microscopically evaluated. 3. -5. There are no significant treatment related changes present in the histologically evaluated sections of the lymph nodes. (JBK:kh 01-19-15)  Diagnosis  07/04/2014 Breast, left, needle core biopsy, UOQ - DUCTAL CARCINOMA IN-SITU. - SEE COMMENT.  Microscopic Comment The ductal carcinoma in situ is low grade. The carcinoma cells are positive for e-cadherin and cytokeratin AE1/AE3. Smooth muscle myosin, calponin, and p63 stains highlight the presence of myoepithelial cells. Estrogen receptor and progesterone receptor studies will be performed and the results reported separately. The results were called to [facility] on 07-08-2014. IMMUNOHISTOCHEMICAL AND MORPHOMETRIC ANALYSIS BY THE AUTOMATED CELLULAR IMAGING SYSTEM (ACIS) Estrogen Receptor: 100%, POSITIVE, STRONG STAINING INTENSITY Progesterone Receptor: 28%, POSITIVE, WEAK STAINING INTENSITY   RADIOGRAPHIC STUDIES: I have personally reviewed the radiological images as listed and agreed with the findings in the report.  Diagnostic Mammogram 06/15/17 IMPRESSION: No evidence of malignancy within either breast. Stable postsurgical changes within each breast.  MM Bilateral Breast 06/14/16 IMPRESSION: No  mammographic evidence for malignancy.  PET 01/18/2016 IMPRESSION: 1. Surgical changes in both breasts. No evidence of hypermetabolic residual or recurrent tumor. No explanation for left-sided scapular pain. 2. Low-level hypermetabolism about the right pectoralis musculature is in the region of surgical changes and favored to be treatment related. 3.  Coronary artery atherosclerosis. Aortic atherosclerosis.  Diagnostic mammogram b/l 06/12/2015 IMPRESSION: No mammographic evidence of malignancy in either breast.  ASSESSMENT & PLAN:  72 y.o. postmenopausal woman, with past medical history of seizure and arthritis, presented with a palpable mass in the inner upper quadrant of her right breast. Biopsy revealed poorly differentiated carcinoma, triple negative.  1. Poorly differentiated mammary carcinoma in her right breast, cT2NxM0, triple negative, ypT0N0 - she received neoadjuvant chemotherapy , with complete pathological response - she also completed adjuvant breast radiation, and tolerated well, - we previously discussed the surveillance plan, which includes annual screening mammogram, self-exam and follow-up visit every 3-4 months for the first 2 years, and every 6 months afterwards for additional 3 years. -Her last PET scan in 12/2015 was negative for metastatic lesions, I don't think  she needs more surveillance CT or PET scan. -She is clinically doing well. Labs reviewed, CBC is within normal limits and CMP is pending. Her physical exam was unremarkable. Her last mammogram from 06/15/17 showed no evidence of malignancy in either breast. There is no clinical concern for recurrence. -Continue breast cancer surveillance and self exam.  -F/u in 6 months  2. Left breast DCIS,  ER+/PR+ -The left breast DCIS had no residual tumor on the surgical sample, it was ER/PR strongly positive. -she is on chemoprevention with tamoxifen, tolerating well except mild hot flash.  3. Cervical spine  stenosis -She is seeing her orthopedic surgeon Dr. Megan Salon and neurosurgeon Dr. 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 Vertell Limber  4. Hot flush -Secondary to tamoxifen. -much improved now, patient previously declined medication. We'll continue monitoring.   5. Bone Health  -Had Bone density in 2017 with Dr. Megan Salon her Orthopedist, patient notes results were normal  -Will have next scan in 2019 -She is taking Calcium and Vit D.   6. Back Pain after a fall in 08/2016 [place] on back due to equilibrium imbalance in 08/2016 -Overall much improved, she is off walker now, uses a crane. Uses Tramadol as needed   7. Genetic -She has multiple family members had breast cancer -her genetic testing was negative for the following 20 genes: ATM, BARD1, BRCA1, BRCA2, BRIP1, CDH1, CHEK2, EPCAM, FANCC, MLH1, MSH2, MSH6, NBN, PALB2, PMS2, PTEN, RAD51C, RAD51D, TP53, and XRCC2  8. History of seizure -on dilantin, dose being adjusted lately   Plan: -continue tamoxifen for breast cancer chemoprevention -lab and f/u in 6 months  All questions were answered. The patient knows to call the clinic with any problems, questions or concerns.  I spent 20 minutes counseling the patient face to face. The total time spent in the appointment was 25 minutes and more than 50% was on counseling.  This document serves as a record of services personally performed by Truitt Merle, MD. It was created on her behalf by Theresia Bough, a trained medical scribe. The creation of this record is based on the scribe's personal observations and the provider's statements to them.   I have reviewed the above documentation for accuracy and completeness, and I agree with the above.    Truitt Merle, MD 07/24/2017 9:03 AM

## 2017-07-24 ENCOUNTER — Inpatient Hospital Stay: Payer: Medicare Other | Attending: Hematology | Admitting: Hematology

## 2017-07-24 ENCOUNTER — Encounter: Payer: Self-pay | Admitting: Hematology

## 2017-07-24 ENCOUNTER — Telehealth: Payer: Self-pay

## 2017-07-24 ENCOUNTER — Inpatient Hospital Stay: Payer: Medicare Other

## 2017-07-24 VITALS — BP 128/73 | HR 65 | Temp 97.9°F | Resp 18 | Ht 66.0 in | Wt 169.9 lb

## 2017-07-24 DIAGNOSIS — R232 Flushing: Secondary | ICD-10-CM | POA: Diagnosis not present

## 2017-07-24 DIAGNOSIS — Z923 Personal history of irradiation: Secondary | ICD-10-CM | POA: Diagnosis not present

## 2017-07-24 DIAGNOSIS — K219 Gastro-esophageal reflux disease without esophagitis: Secondary | ICD-10-CM | POA: Diagnosis not present

## 2017-07-24 DIAGNOSIS — I700 Atherosclerosis of aorta: Secondary | ICD-10-CM | POA: Insufficient documentation

## 2017-07-24 DIAGNOSIS — Z87891 Personal history of nicotine dependence: Secondary | ICD-10-CM | POA: Diagnosis not present

## 2017-07-24 DIAGNOSIS — D0512 Intraductal carcinoma in situ of left breast: Secondary | ICD-10-CM

## 2017-07-24 DIAGNOSIS — Z803 Family history of malignant neoplasm of breast: Secondary | ICD-10-CM | POA: Insufficient documentation

## 2017-07-24 DIAGNOSIS — Z9221 Personal history of antineoplastic chemotherapy: Secondary | ICD-10-CM | POA: Insufficient documentation

## 2017-07-24 DIAGNOSIS — Z7982 Long term (current) use of aspirin: Secondary | ICD-10-CM | POA: Diagnosis not present

## 2017-07-24 DIAGNOSIS — C50211 Malignant neoplasm of upper-inner quadrant of right female breast: Secondary | ICD-10-CM | POA: Diagnosis present

## 2017-07-24 DIAGNOSIS — G40909 Epilepsy, unspecified, not intractable, without status epilepticus: Secondary | ICD-10-CM | POA: Insufficient documentation

## 2017-07-24 DIAGNOSIS — M4802 Spinal stenosis, cervical region: Secondary | ICD-10-CM | POA: Diagnosis not present

## 2017-07-24 DIAGNOSIS — M1990 Unspecified osteoarthritis, unspecified site: Secondary | ICD-10-CM | POA: Insufficient documentation

## 2017-07-24 DIAGNOSIS — H409 Unspecified glaucoma: Secondary | ICD-10-CM | POA: Diagnosis not present

## 2017-07-24 DIAGNOSIS — Z79810 Long term (current) use of selective estrogen receptor modulators (SERMs): Secondary | ICD-10-CM | POA: Insufficient documentation

## 2017-07-24 DIAGNOSIS — Z171 Estrogen receptor negative status [ER-]: Secondary | ICD-10-CM | POA: Diagnosis not present

## 2017-07-24 DIAGNOSIS — Z79899 Other long term (current) drug therapy: Secondary | ICD-10-CM | POA: Insufficient documentation

## 2017-07-24 DIAGNOSIS — I2510 Atherosclerotic heart disease of native coronary artery without angina pectoris: Secondary | ICD-10-CM | POA: Diagnosis not present

## 2017-07-24 LAB — COMPREHENSIVE METABOLIC PANEL
ALT: 12 U/L (ref 0–55)
AST: 15 U/L (ref 5–34)
Albumin: 3.1 g/dL — ABNORMAL LOW (ref 3.5–5.0)
Alkaline Phosphatase: 76 U/L (ref 40–150)
Anion gap: 8 (ref 3–11)
BUN: 15 mg/dL (ref 7–26)
CO2: 27 mmol/L (ref 22–29)
Calcium: 8.9 mg/dL (ref 8.4–10.4)
Chloride: 104 mmol/L (ref 98–109)
Creatinine, Ser: 0.72 mg/dL (ref 0.60–1.10)
GFR calc Af Amer: >60 mL/min (ref >60)
GFR calc non Af Amer: >60 mL/min (ref >60)
Glucose, Bld: 84 mg/dL (ref 70–140)
Potassium: 4 mmol/L (ref 3.5–5.1)
Sodium: 139 mmol/L (ref 136–145)
Total Bilirubin: <0.2 mg/dL — ABNORMAL LOW (ref 0.2–1.2)
Total Protein: 6.6 g/dL (ref 6.4–8.3)

## 2017-07-24 LAB — CBC WITH DIFFERENTIAL/PLATELET
Basophils Absolute: 0 10*3/uL (ref 0.0–0.1)
Basophils Relative: 1 %
Eosinophils Absolute: 0.3 10*3/uL (ref 0.0–0.5)
Eosinophils Relative: 5 %
HCT: 38.4 % (ref 34.8–46.6)
Hemoglobin: 12.9 g/dL (ref 11.6–15.9)
Lymphocytes Relative: 18 %
Lymphs Abs: 0.9 10*3/uL (ref 0.9–3.3)
MCH: 31.8 pg (ref 25.1–34.0)
MCHC: 33.7 g/dL (ref 31.5–36.0)
MCV: 94.4 fL (ref 79.5–101.0)
Monocytes Absolute: 0.5 10*3/uL (ref 0.1–0.9)
Monocytes Relative: 10 %
Neutro Abs: 3.3 10*3/uL (ref 1.5–6.5)
Neutrophils Relative %: 66 %
Platelets: 206 10*3/uL (ref 145–400)
RBC: 4.06 MIL/uL (ref 3.70–5.45)
RDW: 12.7 % (ref 11.2–14.5)
WBC: 4.9 10*3/uL (ref 3.9–10.3)

## 2017-07-24 NOTE — Telephone Encounter (Signed)
Printed avs and calender of upcoming appointment. Per 4/29 los  

## 2017-10-17 ENCOUNTER — Other Ambulatory Visit: Payer: Self-pay | Admitting: Hematology

## 2017-10-17 DIAGNOSIS — Z170 Estrogen receptor positive status [ER+]: Principal | ICD-10-CM

## 2017-10-17 DIAGNOSIS — C50211 Malignant neoplasm of upper-inner quadrant of right female breast: Secondary | ICD-10-CM

## 2018-01-19 ENCOUNTER — Other Ambulatory Visit: Payer: Self-pay | Admitting: Hematology

## 2018-01-19 DIAGNOSIS — Z171 Estrogen receptor negative status [ER-]: Principal | ICD-10-CM

## 2018-01-19 DIAGNOSIS — C50211 Malignant neoplasm of upper-inner quadrant of right female breast: Secondary | ICD-10-CM

## 2018-01-19 NOTE — Progress Notes (Signed)
[facility]  [phone]) [phone] Fax:(336) [phone]  Clinic Follow Up Note   Patient Care Team: Moshe Cipro, MD as PCP - General (Internal Medicine) Lonzo Cloud, MD (General Practice) Fanny Skates, MD as Consulting Physician (General Surgery) Truitt Merle, MD as Consulting Physician (Hematology) Thea Silversmith, MD as Consulting Physician (Radiation Oncology) Rockwell Germany, RN as Registered Nurse Mauro Kaufmann, RN as Registered Nurse Holley Bouche, NP (Inactive) as Nurse Practitioner (Nurse Practitioner) Bary Leriche, MD as Referring Physician (Psychiatry) Erline Levine, MD as Consulting Physician (Neurosurgery)   Date of Service:  01/22/2018  CHIEF COMPLAINTS:  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 Ultrasound of the right axilla is negative    06/05/2014 Initial Biopsy    Right breast core needle bx: invasive mammary carcinoma, ER- (0%), PR- (0%), HER2/neu negative, Ki67: 98%    07/02/2014 PET scan    1. Intensely hypermetabolic mass in the medial RIGHT chest wall with hypermetabolic right axillary node 2. Hypermetabolic nodal metastasis in the LEFT level 2 and LEFT supraclavicular and periportal node (SUV 5).     07/04/2014 Pathology Results    Left breast mass biopsy showed DCIS, ER100%, PR 48%    07/10/2014 Pathology Results    Left Chance node biopsy was negative for malignant cells, benign reactive lymph  tissue     07/10/2014 Clinical Stage    RIGHT: Stage IIA: T2 N0   LEFT:  Stage 0: Tis N0    07/18/2014 - 12/19/2014 Chemotherapy    Dose dense Adriamycin and Cytoxan, every 2 weeks, X4, followed by weekly carbo and Taxol for 12 weeks.    07/28/2014 Pathology Results    10R and 10L lymph node biopsy showed no malignant cells, mixed lymphoid population.    08/11/2014 Miscellaneous    nadir CBC: WBC 0.54, hemoglobin 8.9, hematocrit 26%, platelet 125    01/12/2015 Procedure    Breast/Ovarian ([facility]) panel: no clinically sig variant at ATM, BARD1, BRCA1, BRCA2, BRIP1, CDH1, CHEK2, EPCAM, FANCC, MLH1, MSH2, MSH6, NBN, PALB2, PMS2, PTEN, RAD51C, RAD51D, TP53, and XRCC2    01/15/2015 Surgery    Bilateral lumpectomy and right sentinel lymph node biopsy.    01/15/2015 Pathology Results    Bilateral breast lumpectomy showed fibrocystic change, treatment-related changes, no residual malignancy. 3 right axillary sentinel lymph nodes were negative.    02/24/2015 - 04/14/2015 Radiation Therapy    Right breast 45 Gy over 25 fractions; right supraclav fossa/axilla: 45 Gy over 25 fractions; right breast boost: 16 Gy over 8 fractions.  Total dose RIGHT: 61 Gy  Left breast: 50.4 Gy over 28 fractions.    04/2015 -  Anti-estrogen oral therapy    Tamoxifen 20 mg daily    07/02/2015 Survivorship    Survivorship visit completed    01/18/2016 PET scan    PET 01/18/2016 IMPRESSION: 1. Surgical changes in both breasts. No  evidence of hypermetabolic residual or recurrent tumor. No explanation for left-sided scapular pain. 2. Low-level hypermetabolism about the right pectoralis musculature is in the region of surgical changes and favored to be treatment related. 3.  Coronary artery atherosclerosis. Aortic atherosclerosis    06/14/2016 Imaging    MM Bilateral Breast 06/14/16 IMPRESSION: No mammographic evidence for malignancy.      06/15/2017 Mammogram    IMPRESSION: No evidence of malignancy within either  breast. Stable postsurgical changes within each breast.     HISTORY OF PRESENTING ILLNESS:  Kristin Pennington 72 y.o. female is here because of recently diagnosed poorly differentiated carcinoma in her right breast.  She found a lump in her right breast 3 weeks ago, mild tenderness,  No other complains. Last mammo was in oct 2015 which was negative. She feels well overall. She denies any pain, cough, shortness breast, GI symptoms. She has good energy level and appetite, no recent weight loss.  Her last colonoscopy was for 5 years ago which was negative per patient. Her last Pap smear was 3 years ago which was negative per patient.  CURRERNT THERAPY:  Tamoxifen 20 mg once daily, started on Feb 2017  INTERIM HISTORY:  Kristin Pennington returns for follow-up for her right breast cancer. She was last seen by me 6 months ago. She presents to the clinic today by herself.  She states that she's been following up with cardiology for irregular heart beat, but EKG and stress testing were negative according to the patient. She is tolerating Tamoxifen well, and denies side effects. She has been losing weight intentionally but watching diet.   MEDICAL HISTORY:  Past Medical History:  Diagnosis Date  . Arthritis    knee  . Breast cancer ([place])    bilateral, right breast mass, cancer both breast, x1 chemo  a week ago.  Marland Kitchen GERD (gastroesophageal reflux disease)    with chemo  . Glaucoma    "early" - eye doctor is watching  . Personal history of radiation therapy   . Pneumonia    2015  . 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 Wears glasses   . Wears partial dentures    top     SURGICAL HISTORY: Past Surgical History:  Procedure Laterality Date  . ABDOMINAL HYSTERECTOMY    . APPENDECTOMY    . BREAST BIOPSY    . BREAST LUMPECTOMY Bilateral 2016  . BREAST LUMPECTOMY WITH RADIOACTIVE SEED AND SENTINEL LYMPH NODE BIOPSY Right 01/15/2015   Procedure: RIGHT BREAST LUMPECTOMY WITH RADIOACTIVE  SEED AND SENTINEL LYMPH NODE BIOPSY;  Surgeon: Fanny Skates, MD;  Location: [facility];  Service: General;  Laterality: Right;  . BREAST LUMPECTOMY WITH RADIOACTIVE SEED LOCALIZATION Left 01/15/2015   Procedure: LEFT BREAST LUMPECTOMY WITH RADIOACTIVE SEED LOCALIZATION;  Surgeon: Fanny Skates, MD;  Location: [facility];  Service: General;  Laterality: Left;  . CARPAL TUNNEL RELEASE Left   . CHOLECYSTECTOMY    . COLONOSCOPY    . ENDOBRONCHIAL ULTRASOUND Bilateral 07/28/2014   Procedure: ENDOBRONCHIAL ULTRASOUND;  Surgeon: Collene Gobble, MD;  Location: [facility];  Service: Cardiopulmonary;  Laterality: Bilateral;  . PORT-A-CATH REMOVAL Left 01/15/2015   Procedure: REMOVAL PORT-A-CATH;  Surgeon: Fanny Skates, MD;  Location: [facility];  Service: General;  Laterality: Left;  . PORTACATH PLACEMENT N/A 06/30/2014   Procedure: INSERTION PORT-A-CATH WITH ULTRA SOUND ON STANDBY;  Surgeon: Fanny Skates, MD;  Location: [facility];  Service: General;  Laterality: N/A;  . TONSILLECTOMY  SOCIAL HISTORY: Social History   Socioeconomic History  . Marital status: Married    Spouse name: Not on file  . Number of children: Not on file  . Years of education: Not on file  . Highest education level: Not on file  Occupational History  . Not on file  Social Needs  . Financial resource strain: Not on file  . Food insecurity:    Worry: Not on file    Inability: Not on file  . Transportation needs:    Medical: Not on file    Non-medical: Not on file  Tobacco Use  . Smoking status: Former Smoker    Packs/day: 1.00    Years: 18.00    Pack years: 18.00    Types: Cigarettes    Last attempt to quit: 11/08/1988    Years since quitting: 29.2  . Smokeless tobacco: Never Used  Substance and Sexual Activity  . Alcohol use: No  . Drug use: No  . Sexual activity: Not on file  Lifestyle  . Physical activity:    Days per week: Not on file    Minutes per session: Not on file  . Stress: Not on file    Relationships  . Social connections:    Talks on phone: Not on file    Gets together: Not on file    Attends religious service: Not on file    Active member of club or organization: Not on file    Attends meetings of clubs or organizations: Not on file    Relationship status: Not on file  . Intimate partner violence:    Fear of current or ex partner: Not on file    Emotionally abused: Not on file    Physically abused: Not on file    Forced sexual activity: Not on file  Other Topics Concern  . Not on file  Social History Narrative  . Not on file     GYN HISTORY  Menarchal: 11 LMP: 1994, hysrectomy  Contraceptive: no  HRT: since 1994  G2P1, one miscarriage     FAMILY HISTORY: Family History  Problem Relation Age of Onset  . Stomach cancer Father 13  . Lung cancer Sister 78       not sure if primary or breast cancer met  . Breast cancer Maternal Aunt 72  . Breast cancer Cousin 9  . Heart Problems Paternal Aunt   . Congestive Heart Failure Mother     ALLERGIES:  is allergic to aspirin; codeine; meloxicam; nitrofurantoin; other; pyridium [phenazopyridine hcl]; sulfa antibiotics; tetracyclines & related; and tape.  MEDICATIONS:  Current Outpatient Medications  Medication Sig Dispense Refill  . Calcium Carb-Cholecalciferol (CALCIUM 600+D3) 600-400 MG-UNIT TABS Take 1 tablet by mouth 2 (two) times daily.    . clotrimazole-betamethasone (LOTRISONE) cream     . cyanocobalamin (,VITAMIN B-12,) 1000 MCG/ML injection Inject 1,000 mcg into the muscle every 30 (thirty) days.     . diclofenac (VOLTAREN) 50 MG EC tablet Take 50 mg by mouth 2 (two) times daily.    Marland Kitchen levETIRAcetam (KEPPRA) 500 MG tablet Take 1 tablet by mouth 2 (two) times daily.    Marland Kitchen oxaprozin (DAYPRO) 600 MG tablet Take 1 tablet by mouth 2 (two) times daily. Reported on 07/27/2015    . phenytoin (DILANTIN) 100 MG ER capsule Take 1 tablet by mouth 4 (four) times daily. On  Sat , Sun, Tues, Wed, and  Thursdays    -   Take   156m  QID.    .Marland Kitchen  sodium chloride (OCEAN) 0.65 % SOLN nasal spray Place 1 spray into both nostrils as needed for congestion.    . tamoxifen (NOLVADEX) 20 MG tablet Take 1 tablet (20 mg total) by mouth daily. 90 tablet 1  . Tetrahydrozoline HCl ([employer] EYE DROPS OP) Apply 1 drop to eye 3 (three) times daily.     No current facility-administered medications for this visit.     REVIEW OF SYSTEMS:   Constitutional: Denies fevers, weight loss, or appetite changes (+) intentional weight loss  Eyes: Denies blurriness of vision, double vision or watery eyes Ears, nose, mouth, throat, and face: Denies mucositis or sore throat Respiratory: Denies cough, dyspnea or wheezes Cardiovascular: Denies palpitation, chest discomfort or lower extremity swelling Gastrointestinal:  Denies nausea, heartburn or change in bowel habits  Skin: Denies abnormal skin rashes Lymphatics: Denies new lymphadenopathy or easy bruising MSK: denies new joint pain or myalgia  Neurological:Denies numbness, tingling or new weaknesses Behavioral/Psych: Mood is stable, no new changes  All other systems were reviewed with the patient and are negative.  PHYSICAL EXAMINATION: ECOG PERFORMANCE STATUS: 1  BP 117/74 (BP Location: Left Arm, Patient Position: Sitting)   Pulse 71   Temp 97.8 F (36.6 C) (Oral)   Resp 18   Ht _0  (1.676 m)   Wt 155 lb 11.2 oz (70.6 kg)   SpO2 98%   BMI 25.13 kg/m    GENERAL:alert, no distress and comfortable SKIN: skin color, texture, turgor are normal, no rashes or significant lesions EYES: normal, conjunctiva are pink and non-injected, sclera clear OROPHARYNX:no exudate, no erythema and lips, buccal mucosa, and tongue normal  NECK: supple, thyroid normal size, non-tender, without nodularity LYMPH:  no palpable lymphadenopathy in the cervical, axillary or inguinal LUNGS: clear to auscultation and percussion with normal breathing effort HEART: regular rate & rhythm and no murmurs and no  lower extremity edema ABDOMEN:abdomen soft, non-tender and normal bowel sounds Musculoskeletal:no cyanosis of digits and no clubbing, no significant right arm swelling PSYCH: alert & oriented x 3 with fluent speech NEURO: no focal motor/sensory deficits Breasts: Breast inspection showed them to be symmetrical with no nipple discharge.  (+) Surgical incision site in UOQ both breasts have healed well, no significant scar or nodule, no significant skin pigmentation or edema. No other palpable mass or adenopathy.   LABORATORY DATA:  I have reviewed the data as listed CBC Latest Ref Rng & Units 01/22/2018 07/24/2017 01/23/2017  WBC 4.0 - 10.5 K/uL 4.9 4.9 3.3(L)  Hemoglobin 12.0 - 15.0 g/dL 12.6 12.9 12.8  Hematocrit 36.0 - 46.0 % 38.9 38.4 38.8  Platelets 150 - 400 K/uL 210 206 186    CMP Latest Ref Rng & Units 01/22/2018 07/24/2017 01/23/2017  Glucose 70 - 99 mg/dL 80 84 79  BUN 8 - 23 mg/dL 15 15 15.1  Creatinine 0.44 - 1.00 mg/dL 0.73 0.72 0.7  Sodium 135 - 145 mmol/L 142 139 141  Potassium 3.5 - 5.1 mmol/L 4.3 4.0 4.3  Chloride 98 - 111 mmol/L 105 104 -  CO2 22 - 32 mmol/L _1 Calcium 8.9 - 10.3 mg/dL 9.1 8.9 8.9  Total Protein 6.5 - 8.1 g/dL 6.8 6.6 6.5  Total Bilirubin 0.3 - 1.2 mg/dL <0.2(L) <0.2(L) <0.22  Alkaline Phos 38 - 126 U/L 68 76 71  AST 15 - 41 U/L 14(L) 15 13  ALT 0 - 44 U/L _2 Her outside CBC and CMP were unremarkable on 01/14/2016  PATHOLOGY REPORT 06/05/2014 Diagnosis 01/15/2015 1. Breast, lumpectomy, Left - FIBROCYSTIC CHANGE WITH ADENOSIS AND CALCIFICATIONS. - TREATMENT RELATED CHANGES. - THERE IS NO EVIDENCE OF MALIGNANCY. - SEE COMMENT. 2. Breast, lumpectomy, Right - FIBROCYSTIC CHANGES WITH ADENOSIS. - TREATMENT RELATED CHANGES. - THERE IS NO EVIDENCE OF MALIGNANCY. - SEE COMMENT. 3. Lymph node, sentinel, biopsy, right axillary #1 - THERE IS NO EVIDENCE OF CARCINOMA IN 1 OF 1 LYMPH NODE (0/1). - SEE COMMENT 4. Lymph node, sentinel,  biopsy, right axillary #2 - THERE IS NO EVIDENCE OF CARCINOMA IN 1 OF 1 LYMPH NODE (0/1). - SEE COMMENT. 5. Lymph node, sentinel, biopsy, right axillary #3 - THERE IS NO EVIDENCE OF CARCINOMA IN 1 OF 1 LYMPH NODE (0/1). - SEE COMMENT.  Microscopic Comment 1. Carcinoma is not identified in the current specimen. The surgical resection margin(s) of the specimen were inked and microscopically evaluated. 2. Carcinoma is not identified in the current specimen. The surgical resection margin(s) of the specimen were inked and microscopically evaluated. 3. -5. There are no significant treatment related changes present in the histologically evaluated sections of the lymph nodes. (JBK:kh 01-19-15)  Diagnosis  07/04/2014 Breast, left, needle core biopsy, UOQ - DUCTAL CARCINOMA IN-SITU. - SEE COMMENT.  Microscopic Comment The ductal carcinoma in situ is low grade. The carcinoma cells are positive for e-cadherin and cytokeratin AE1/AE3. Smooth muscle myosin, calponin, and p63 stains highlight the presence of myoepithelial cells. Estrogen receptor and progesterone receptor studies will be performed and the results reported separately. The results were called to [facility] on 07-08-2014. IMMUNOHISTOCHEMICAL AND MORPHOMETRIC ANALYSIS BY THE AUTOMATED CELLULAR IMAGING SYSTEM (ACIS) Estrogen Receptor: 100%, POSITIVE, STRONG STAINING INTENSITY Progesterone Receptor: 28%, POSITIVE, WEAK STAINING INTENSITY   RADIOGRAPHIC STUDIES: I have personally reviewed the radiological images as listed and agreed with the findings in the report.  Diagnostic Mammogram 06/15/17 IMPRESSION: No evidence of malignancy within either breast. Stable postsurgical changes within each breast.  MM Bilateral Breast 06/14/16 IMPRESSION: No mammographic evidence for malignancy.  PET 01/18/2016 IMPRESSION: 1. Surgical changes in both breasts. No evidence of hypermetabolic residual or recurrent tumor. No  explanation for left-sided scapular pain. 2. Low-level hypermetabolism about the right pectoralis musculature is in the region of surgical changes and favored to be treatment related. 3.  Coronary artery atherosclerosis. Aortic atherosclerosis.  Diagnostic mammogram b/l 06/12/2015 IMPRESSION: No mammographic evidence of malignancy in either breast.  ASSESSMENT & PLAN:  72 y.o. postmenopausal woman, with past medical history of seizure and arthritis, presented with a palpable mass in the inner upper quadrant of her right breast. Biopsy revealed poorly differentiated carcinoma, triple negative.  1. 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 -Her last PET scan in 12/2015 was negative for metastatic lesions, I don't think she needs more surveillance CT or PET scan. -She is clinically doing well. Labs reviewed, CBC is within normal limits and CMP is pending. Her physical exam was unremarkable. Her last mammogram from 06/15/17 showed no evidence of malignancy in either breast. There is no clinical concern for recurrence. -It has been 3-1/2 years since her initial diagnosis, her risk of recurrence has decreased significantly. -Continue breast  cancer surveillance and self exam. Next Mammogram in 05/2018 -F/u in 6 months  2. Left breast DCIS,  ER+/PR+ -The left breast DCIS had no residual tumor on the surgical sample, it was ER/PR strongly positive. -she is on chemoprevention with tamoxifen, tolerating well except mild hot flash.  Will continue for total of 5 years.  3.  Cervical spine stenosis -She is seeing her orthopedic surgeon Dr. Megan Salon and neurosurgeon Dr. 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 Vertell Limber  4. Hot flush -Secondary to tamoxifen. -much improved now, patient previously declined medication. We'll continue monitoring.   5. Bone Health  -Had Bone density in 2017 with Dr. Megan Salon her Orthopedist, patient notes results were normal  -Will have next scan in 2019 -She is taking Calcium and Vit D.   6. Back Pain after a fall in 08/2016 [place] on back due to equilibrium imbalance in 08/2016 -Overall much improved, she is off walker now, uses a crane. Uses Tramadol as needed   7. Genetic -She has multiple family members had breast cancer -her genetic testing was negative for the following 20 genes: ATM, BARD1, BRCA1, BRCA2, BRIP1, CDH1, CHEK2, EPCAM, FANCC, MLH1, MSH2, MSH6, NBN, PALB2, PMS2, PTEN, RAD51C, RAD51D, TP53, and XRCC2  8. History of seizure -on dilantin, dose being adjusted lately   Plan: -continue tamoxifen for breast cancer chemoprevention. Refilled today.  -lab and f/u in 6 months -Next mammogram 05/2018  All questions were answered. The patient knows to call the clinic with any problems, questions or concerns.  I spent 20 minutes counseling the patient face to face. The total time spent in the appointment was 25 minutes and more than 50% was on counseling.  Dierdre Searles Dweik am acting as scribe for Dr. Truitt Merle.  I have reviewed the above documentation for accuracy and completeness, and I agree with the above.     Truitt Merle, MD 01/22/2018

## 2018-01-22 ENCOUNTER — Inpatient Hospital Stay: Payer: Medicare Other | Attending: Hematology | Admitting: Hematology

## 2018-01-22 ENCOUNTER — Inpatient Hospital Stay: Payer: Medicare Other

## 2018-01-22 ENCOUNTER — Encounter: Payer: Self-pay | Admitting: Hematology

## 2018-01-22 ENCOUNTER — Telehealth: Payer: Self-pay | Admitting: Hematology

## 2018-01-22 VITALS — BP 117/74 | HR 71 | Temp 97.8°F | Resp 18 | Ht 66.0 in | Wt 155.7 lb

## 2018-01-22 DIAGNOSIS — D0512 Intraductal carcinoma in situ of left breast: Secondary | ICD-10-CM

## 2018-01-22 DIAGNOSIS — I700 Atherosclerosis of aorta: Secondary | ICD-10-CM | POA: Diagnosis not present

## 2018-01-22 DIAGNOSIS — I2510 Atherosclerotic heart disease of native coronary artery without angina pectoris: Secondary | ICD-10-CM | POA: Insufficient documentation

## 2018-01-22 DIAGNOSIS — Z79810 Long term (current) use of selective estrogen receptor modulators (SERMs): Secondary | ICD-10-CM | POA: Insufficient documentation

## 2018-01-22 DIAGNOSIS — Z9221 Personal history of antineoplastic chemotherapy: Secondary | ICD-10-CM | POA: Insufficient documentation

## 2018-01-22 DIAGNOSIS — Z79899 Other long term (current) drug therapy: Secondary | ICD-10-CM | POA: Diagnosis not present

## 2018-01-22 DIAGNOSIS — Z171 Estrogen receptor negative status [ER-]: Principal | ICD-10-CM

## 2018-01-22 DIAGNOSIS — K219 Gastro-esophageal reflux disease without esophagitis: Secondary | ICD-10-CM | POA: Insufficient documentation

## 2018-01-22 DIAGNOSIS — Z170 Estrogen receptor positive status [ER+]: Secondary | ICD-10-CM | POA: Diagnosis not present

## 2018-01-22 DIAGNOSIS — C50211 Malignant neoplasm of upper-inner quadrant of right female breast: Secondary | ICD-10-CM | POA: Diagnosis present

## 2018-01-22 DIAGNOSIS — Z87891 Personal history of nicotine dependence: Secondary | ICD-10-CM | POA: Insufficient documentation

## 2018-01-22 DIAGNOSIS — Z923 Personal history of irradiation: Secondary | ICD-10-CM | POA: Insufficient documentation

## 2018-01-22 DIAGNOSIS — M1990 Unspecified osteoarthritis, unspecified site: Secondary | ICD-10-CM | POA: Diagnosis not present

## 2018-01-22 DIAGNOSIS — H409 Unspecified glaucoma: Secondary | ICD-10-CM | POA: Insufficient documentation

## 2018-01-22 DIAGNOSIS — Z803 Family history of malignant neoplasm of breast: Secondary | ICD-10-CM | POA: Insufficient documentation

## 2018-01-22 LAB — CMP (CANCER CENTER ONLY)
ALT: 10 U/L (ref 0–44)
AST: 14 U/L — ABNORMAL LOW (ref 15–41)
Albumin: 3.1 g/dL — ABNORMAL LOW (ref 3.5–5.0)
Alkaline Phosphatase: 68 U/L (ref 38–126)
Anion gap: 9 (ref 5–15)
BUN: 15 mg/dL (ref 8–23)
CO2: 28 mmol/L (ref 22–32)
Calcium: 9.1 mg/dL (ref 8.9–10.3)
Chloride: 105 mmol/L (ref 98–111)
Creatinine: 0.73 mg/dL (ref 0.44–1.00)
GFR, Est AFR Am: >60 mL/min (ref >60)
GFR, Estimated: >60 mL/min (ref >60)
Glucose, Bld: 80 mg/dL (ref 70–99)
Potassium: 4.3 mmol/L (ref 3.5–5.1)
Sodium: 142 mmol/L (ref 135–145)
Total Bilirubin: <0.2 mg/dL — ABNORMAL LOW (ref 0.3–1.2)
Total Protein: 6.8 g/dL (ref 6.5–8.1)

## 2018-01-22 LAB — CBC WITH DIFFERENTIAL (CANCER CENTER ONLY)
Abs Immature Granulocytes: 0.01 10*3/uL (ref 0.00–0.07)
Basophils Absolute: 0 10*3/uL (ref 0.0–0.1)
Basophils Relative: 1 %
Eosinophils Absolute: 0.5 10*3/uL (ref 0.0–0.5)
Eosinophils Relative: 9 %
HCT: 38.9 % (ref 36.0–46.0)
Hemoglobin: 12.6 g/dL (ref 12.0–15.0)
Immature Granulocytes: 0 %
Lymphocytes Relative: 15 %
Lymphs Abs: 0.7 10*3/uL (ref 0.7–4.0)
MCH: 31.7 pg (ref 26.0–34.0)
MCHC: 32.4 g/dL (ref 30.0–36.0)
MCV: 98 fL (ref 80.0–100.0)
Monocytes Absolute: 0.8 10*3/uL (ref 0.1–1.0)
Monocytes Relative: 17 %
Neutro Abs: 2.9 10*3/uL (ref 1.7–7.7)
Neutrophils Relative %: 58 %
Platelet Count: 210 10*3/uL (ref 150–400)
RBC: 3.97 MIL/uL (ref 3.87–5.11)
RDW: 12 % (ref 11.5–15.5)
WBC Count: 4.9 10*3/uL (ref 4.0–10.5)
nRBC: 0 % (ref 0.0–0.2)

## 2018-01-22 MED ORDER — TAMOXIFEN CITRATE 20 MG PO TABS: 20.0000 mg | ORAL_TABLET | Freq: Every day | ORAL | 1 refills | Status: DC

## 2018-01-22 NOTE — Telephone Encounter (Signed)
Gave pt avs and calendar  °

## 2018-06-20 ENCOUNTER — Other Ambulatory Visit: Payer: Self-pay

## 2018-06-20 ENCOUNTER — Ambulatory Visit
Admission: RE | Admit: 2018-06-20 | Discharge: 2018-06-20 | Disposition: A | Discharge status: Home / Self Care | Payer: Medicare Other | Source: Ambulatory Visit | Attending: Hematology | Admitting: Hematology

## 2018-06-20 DIAGNOSIS — Z171 Estrogen receptor negative status [ER-]: Principal | ICD-10-CM

## 2018-06-20 DIAGNOSIS — C50211 Malignant neoplasm of upper-inner quadrant of right female breast: Secondary | ICD-10-CM

## 2018-07-11 ENCOUNTER — Telehealth: Payer: Self-pay | Admitting: Hematology

## 2018-07-11 NOTE — Telephone Encounter (Signed)
Rescheduled 4/27 appt per sch msg. Called and spoke with patient. Confirmed date and time.

## 2018-07-23 ENCOUNTER — Other Ambulatory Visit: Payer: Medicare Other

## 2018-07-23 ENCOUNTER — Ambulatory Visit: Payer: Medicare Other | Admitting: Hematology

## 2018-09-03 NOTE — Progress Notes (Signed)
[place]   [phone] Fax:([phone]) [phone]   Clinic Follow up Note   Patient Care Team: Moshe Cipro, MD as PCP - General (Internal Medicine) Lonzo Cloud, MD (General Practice) Fanny Skates, MD as Consulting Physician (General Surgery) Truitt Merle, MD as Consulting Physician (Hematology) Thea Silversmith, MD as Consulting Physician (Radiation Oncology) Rockwell Germany, RN as Registered Nurse Mauro Kaufmann, RN as Registered Nurse Holley Bouche, NP (Inactive) as Nurse Practitioner (Nurse Practitioner) Bary Leriche, MD as Referring Physician (Psychiatry) Erline Levine, MD as Consulting Physician (Neurosurgery)  Date of Service:  09/05/2018  CHIEF COMPLAINT: 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 Ultrasound of the right axilla is negative    06/05/2014 Initial Biopsy    Right breast core needle bx: invasive mammary carcinoma, ER- (0%), PR- (0%), HER2/neu negative, Ki67: 98%    07/02/2014 PET scan    1. Intensely hypermetabolic mass in the medial RIGHT chest wall with hypermetabolic right axillary node 2. Hypermetabolic nodal metastasis in the LEFT level 2 and LEFT supraclavicular and periportal node (SUV 5).     07/04/2014 Pathology Results    Left breast mass biopsy showed DCIS, ER100%, PR 48%    07/10/2014 Pathology Results    Left [place] node biopsy was negative for malignant  cells, benign reactive lymph tissue     07/10/2014 Clinical Stage    RIGHT: Stage IIA: T2 N0   LEFT:  Stage 0: Tis N0    07/18/2014 - 12/19/2014 Chemotherapy    Dose dense Adriamycin and Cytoxan, every 2 weeks, X4, followed by weekly carbo and Taxol for 12 weeks.    07/28/2014 Pathology Results    10R and 10L lymph node biopsy showed no malignant cells, mixed lymphoid population.    08/11/2014 Miscellaneous    nadir CBC: WBC 0.54, hemoglobin 8.9, hematocrit 26%, platelet 125    01/12/2015 Procedure    Breast/Ovarian ([facility]) panel: no clinically sig variant at ATM, BARD1, BRCA1, BRCA2, BRIP1, CDH1, CHEK2, EPCAM, FANCC, MLH1, MSH2, MSH6, NBN, PALB2, PMS2, PTEN, RAD51C, RAD51D, TP53, and XRCC2    01/15/2015 Surgery    Bilateral lumpectomy and right sentinel lymph node biopsy.    01/15/2015 Pathology Results    Bilateral breast lumpectomy showed fibrocystic change, treatment-related changes, no residual malignancy. 3 right axillary sentinel lymph nodes were negative.    02/24/2015 - 04/14/2015 Radiation Therapy    Right breast 45 Gy over 25 fractions; right supraclav fossa/axilla: 45 Gy over 25 fractions; right breast boost: 16 Gy over 8 fractions.  Total dose RIGHT: 61 Gy  Left breast: 50.4 Gy over 28 fractions.    04/2015 -  Anti-estrogen oral therapy    Tamoxifen 20 mg daily    07/02/2015 Survivorship    Survivorship visit completed    01/18/2016 PET scan    PET 01/18/2016 IMPRESSION: 1. Surgical changes  in both breasts. No evidence of hypermetabolic residual or recurrent tumor. No explanation for left-sided scapular pain. 2. Low-level hypermetabolism about the right pectoralis musculature is in the region of surgical changes and favored to be treatment related. 3.  Coronary artery atherosclerosis. Aortic atherosclerosis    06/14/2016 Imaging    MM Bilateral Breast 06/14/16 IMPRESSION: No mammographic evidence for malignancy.      06/15/2017 Mammogram    IMPRESSION: No evidence  of malignancy within either breast. Stable postsurgical changes within each breast.      CURRENT THERAPY:  Tamoxifen 20 mg once daily, started on Feb 2017  INTERVAL HISTORY:  Kristin Pennington is here for a follow up of right breast cancer. She presents to the clinic alone. She is doing well overall. She notes she has been staying home most of time. She denies any new issues or changes. She is taking Tamoxifen. She notes having hot flashes which is manageable. She also has arthritis in her knees which she will see her orthopedist for. 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  She is taking calcium and VitD. She still takes Keppra and Dilantin due to h/o of seizures.  She notes she still has chronic back pain from prior fall. This is manageable. This is helped with aspirin.     REVIEW OF SYSTEMS:   Constitutional: Denies fevers, chills or abnormal weight loss (+) hot flashes  Eyes: Denies blurriness of vision Ears, nose, mouth, throat, and face: Denies mucositis or sore throat Respiratory: Denies cough, dyspnea or wheezes Cardiovascular: Denies palpitation, chest discomfort or lower extremity swelling Gastrointestinal:  Denies nausea, heartburn or change in bowel habits Skin: Denies abnormal skin rashes MSK: (+) Arthritis in knees (+) chronic back pain  Lymphatics: Denies new lymphadenopathy or easy bruising Neurological: (+) Neuropathy in toes and feet  Behavioral/Psych: Mood is stable, no new changes  All other systems were reviewed with the patient and are negative.  MEDICAL HISTORY:  Past Medical History:  Diagnosis Date  . Arthritis    knee  . Breast cancer ([place])    bilateral, right breast mass, cancer both breast, x1 chemo  a week ago.  Marland Kitchen GERD (gastroesophageal reflux disease)    with chemo  . Glaucoma    "early" - eye doctor is watching  . Personal history of radiation therapy   . Pneumonia    2015  . Seizures ([place])    last 2006  "hereditary" type   . Wears glasses   . Wears partial dentures    top    SURGICAL HISTORY: Past Surgical History:  Procedure Laterality Date  . ABDOMINAL HYSTERECTOMY    . APPENDECTOMY    . BREAST BIOPSY    . BREAST LUMPECTOMY Bilateral 2016  . BREAST LUMPECTOMY WITH RADIOACTIVE SEED AND SENTINEL LYMPH NODE BIOPSY Right 01/15/2015   Procedure: RIGHT BREAST LUMPECTOMY WITH RADIOACTIVE SEED AND SENTINEL LYMPH NODE BIOPSY;  Surgeon: Fanny Skates, MD;  Location: [facility];  Service: General;  Laterality: Right;  . BREAST LUMPECTOMY WITH RADIOACTIVE SEED LOCALIZATION Left 01/15/2015   Procedure: LEFT BREAST LUMPECTOMY WITH RADIOACTIVE SEED LOCALIZATION;  Surgeon: Fanny Skates, MD;  Location: [facility];  Service: General;  Laterality: Left;  . CARPAL TUNNEL RELEASE Left   . CHOLECYSTECTOMY    . COLONOSCOPY    . ENDOBRONCHIAL ULTRASOUND Bilateral 07/28/2014   Procedure: ENDOBRONCHIAL ULTRASOUND;  Surgeon: Collene Gobble, MD;  Location: [facility];  Service: Cardiopulmonary;  Laterality: Bilateral;  . PORT-A-CATH REMOVAL  Left 01/15/2015   Procedure: REMOVAL PORT-A-CATH;  Surgeon: Fanny Skates, MD;  Location: [facility];  Service: General;  Laterality: Left;  . PORTACATH PLACEMENT N/A 06/30/2014   Procedure: INSERTION PORT-A-CATH WITH ULTRA SOUND ON STANDBY;  Surgeon: Fanny Skates, MD;  Location: [facility];  Service: General;  Laterality: N/A;  . TONSILLECTOMY      I have reviewed the social history and family history with the patient and they are unchanged from previous note.  ALLERGIES:  is allergic to aspirin; codeine; meloxicam; nitrofurantoin; other; pyridium [phenazopyridine hcl]; sulfa antibiotics; tetracyclines & related; and tape.  MEDICATIONS:  Current Outpatient Medications  Medication Sig Dispense Refill  . Calcium Carb-Cholecalciferol (CALCIUM 600+D3) 600-400 MG-UNIT TABS Take 1 tablet by mouth 2 (two) times daily.    . cyanocobalamin (,VITAMIN B-12,) 1000 MCG/ML injection Inject 1,000  mcg into the muscle every 30 (thirty) days.     . diclofenac (VOLTAREN) 50 MG EC tablet Take 50 mg by mouth 2 (two) times daily.    Marland Kitchen levETIRAcetam (KEPPRA) 500 MG tablet Take 1 tablet by mouth 2 (two) times daily.    Marland Kitchen oxaprozin (DAYPRO) 600 MG tablet Take 1 tablet by mouth 2 (two) times daily. Reported on 07/27/2015    . phenytoin (DILANTIN) 100 MG ER capsule Take 1 tablet by mouth 4 (four) times daily. On  Sat , Sun, Tues, Wed, and  Thursdays    -  Take   18m  QID.    .Marland Kitchensodium chloride (OCEAN) 0.65 % SOLN nasal spray Place 1 spray into both nostrils as needed for congestion.    . tamoxifen (NOLVADEX) 20 MG tablet Take 1 tablet (20 mg total) by mouth daily. 90 tablet 1  . Tetrahydrozoline HCl ([employer] EYE DROPS OP) Apply 1 drop to eye 3 (three) times daily.    . clotrimazole-betamethasone (LOTRISONE) cream      No current facility-administered medications for this visit.     PHYSICAL EXAMINATION: ECOG PERFORMANCE STATUS: 0 - Asymptomatic  Vitals:   09/05/18 0955  BP: 129/75  Pulse: 72  Resp: 18  Temp: 97.6 F (36.4 C)  SpO2: 97%   Filed Weights   09/05/18 0955  Weight: 159 lb 14.4 oz (72.5 kg)    GENERAL:alert, no distress and comfortable SKIN: skin color, texture, turgor are normal, no rashes or significant lesions EYES: normal, Conjunctiva are pink and non-injected, sclera clear  NECK: supple, thyroid normal size, non-tender, without nodularity LYMPH:  no palpable lymphadenopathy in the cervical, axillary  LUNGS: clear to auscultation and percussion with normal breathing effort HEART: regular rate & rhythm and no murmurs and no lower extremity edema ABDOMEN:abdomen soft, non-tender and normal bowel sounds Musculoskeletal:no cyanosis of digits and no clubbing  NEURO: alert & oriented x 3 with fluent speech, no focal motor/sensory deficits BREAST: S/p b/l lumpectomy: Surgical incisions healed well. (+) No palpable mass or adenopathy of either breasts.   LABORATORY DATA:  I  have reviewed the data as listed CBC Latest Ref Rng & Units 09/05/2018 01/22/2018 07/24/2017  WBC 4.0 - 10.5 K/uL 4.9 4.9 4.9  Hemoglobin 12.0 - 15.0 g/dL 12.9 12.6 12.9  Hematocrit 36.0 - 46.0 % 38.7 38.9 38.4  Platelets 150 - 400 K/uL 195 210 206     CMP Latest Ref Rng & Units 09/05/2018 01/22/2018 07/24/2017  Glucose 70 - 99 mg/dL 76 80 84  BUN 8 - 23 mg/dL _0 Creatinine 0.44 - 1.00 mg/dL 0.74 0.73 0.72  Sodium 135 -  145 mmol/L 139 142 139  Potassium 3.5 - 5.1 mmol/L 4.2 4.3 4.0  Chloride 98 - 111 mmol/L 104 105 104  CO2 22 - 32 mmol/L _0 Calcium 8.9 - 10.3 mg/dL 8.5(L) 9.1 8.9  Total Protein 6.5 - 8.1 g/dL 6.6 6.8 6.6  Total Bilirubin 0.3 - 1.2 mg/dL 0.2(L) <0.2(L) <0.2(L)  Alkaline Phos 38 - 126 U/L 72 68 76  AST 15 - 41 U/L 13(L) 14(L) 15  ALT 0 - 44 U/L _1 RADIOGRAPHIC STUDIES: I have personally reviewed the radiological images as listed and agreed with the findings in the report. No results found.   ASSESSMENT & PLAN:  Ludmilla Mcgillis is a 73 y.o. female with   1. Poorly differentiated mammary carcinoma in her right breast, cT2NxM0, triple negative, ypT0N0 2. Left breast DCIS,  ER+/PR+  -She was diagnosed in 05/2014 with right breast cancer. She was diagnosed in 06/2014 with left breast DCIS. She is s/p neoadjuvant AC-T, B/l lumpectomies, B/l adjuvant radiation.  -The left breast DCIS had no residual tumor on the surgical sample, it was ER/PR strongly positive. -She has been on anti-estrogen therapy with Tamoxifen since 04/2015. Tolerating well with manageable hot flashes. Will continue for total of 5 years. 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  -She is clinically doing well. Lab reviewed, her CBC and CMP are within normal limits except calcium 8.5. Her physical exam and her 05/2018 mammogram were unremarkable. There is no clinical concern for recurrence. -Continue surveillance. Next mammogram in 05/2019 -Continue Tamoxifen  -F/u in 6  months   3. Cervical spine stenosis -She is seeing her orthopedic surgeon Dr. Megan Salon and neurosurgeon Dr. 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 Vertell Limber  4. Hot flush -Secondary to tamoxifen. Manageable.   5. Bone Health  -Had Bone density in 2017 with Dr. Megan Salon her Orthopedist, patient notes results were normal  -She had DEXA with Orthopedist in 04/2018. Per pt it was normal.  -She is taking Calcium and Vit D. Will continue   6. Chronic Back Pain after a fall in 08/2016 [place] on back due to equilibrium imbalance in 08/2016 -Manageable with aspirin, Ambulated independently but has cane or walker if needed.  -overall improved   7. Genetic Testing negative   8. History of seizure -on dilantin and Keppra   Plan: -She is clinically doing well  -Continue Tamoxifen  -lab and f/u in 6 months   No problem-specific Assessment & Plan notes found for this encounter.   No orders of the defined types were placed in this encounter.  All questions were answered. The patient knows to call the clinic with any problems, questions or concerns. No barriers to learning was detected. I spent 15 minutes counseling the patient face to face. The total time spent in the appointment was 20 minutes and more than 50% was on counseling and review of test results     Truitt Merle, MD 09/05/2018   I, Joslyn Devon, am acting as scribe for Truitt Merle, MD.   I have reviewed the above documentation for accuracy and completeness, and I agree with the above.

## 2018-09-05 ENCOUNTER — Encounter: Payer: Self-pay | Admitting: Hematology

## 2018-09-05 ENCOUNTER — Inpatient Hospital Stay: Payer: Medicare Other

## 2018-09-05 ENCOUNTER — Other Ambulatory Visit: Payer: Self-pay

## 2018-09-05 ENCOUNTER — Inpatient Hospital Stay: Payer: Medicare Other | Attending: Hematology | Admitting: Hematology

## 2018-09-05 VITALS — BP 129/75 | HR 72 | Temp 97.6°F | Resp 18 | Ht 66.0 in | Wt 159.9 lb

## 2018-09-05 DIAGNOSIS — Z79899 Other long term (current) drug therapy: Secondary | ICD-10-CM | POA: Diagnosis not present

## 2018-09-05 DIAGNOSIS — M4802 Spinal stenosis, cervical region: Secondary | ICD-10-CM | POA: Diagnosis not present

## 2018-09-05 DIAGNOSIS — Z79810 Long term (current) use of selective estrogen receptor modulators (SERMs): Secondary | ICD-10-CM | POA: Diagnosis not present

## 2018-09-05 DIAGNOSIS — N951 Menopausal and female climacteric states: Secondary | ICD-10-CM | POA: Insufficient documentation

## 2018-09-05 DIAGNOSIS — C50211 Malignant neoplasm of upper-inner quadrant of right female breast: Secondary | ICD-10-CM | POA: Insufficient documentation

## 2018-09-05 DIAGNOSIS — D0512 Intraductal carcinoma in situ of left breast: Secondary | ICD-10-CM | POA: Diagnosis not present

## 2018-09-05 DIAGNOSIS — Z170 Estrogen receptor positive status [ER+]: Secondary | ICD-10-CM

## 2018-09-05 DIAGNOSIS — Z791 Long term (current) use of non-steroidal anti-inflammatories (NSAID): Secondary | ICD-10-CM | POA: Diagnosis not present

## 2018-09-05 DIAGNOSIS — Z9221 Personal history of antineoplastic chemotherapy: Secondary | ICD-10-CM

## 2018-09-05 DIAGNOSIS — G629 Polyneuropathy, unspecified: Secondary | ICD-10-CM | POA: Diagnosis not present

## 2018-09-05 DIAGNOSIS — Z171 Estrogen receptor negative status [ER-]: Secondary | ICD-10-CM | POA: Diagnosis not present

## 2018-09-05 DIAGNOSIS — Z923 Personal history of irradiation: Secondary | ICD-10-CM | POA: Diagnosis not present

## 2018-09-05 LAB — CMP (CANCER CENTER ONLY)
ALT: 9 U/L (ref 0–44)
AST: 13 U/L — ABNORMAL LOW (ref 15–41)
Albumin: 3.2 g/dL — ABNORMAL LOW (ref 3.5–5.0)
Alkaline Phosphatase: 72 U/L (ref 38–126)
Anion gap: 8 (ref 5–15)
BUN: 16 mg/dL (ref 8–23)
CO2: 27 mmol/L (ref 22–32)
Calcium: 8.5 mg/dL — ABNORMAL LOW (ref 8.9–10.3)
Chloride: 104 mmol/L (ref 98–111)
Creatinine: 0.74 mg/dL (ref 0.44–1.00)
GFR, Est AFR Am: >60 mL/min (ref >60)
GFR, Estimated: >60 mL/min (ref >60)
Glucose, Bld: 76 mg/dL (ref 70–99)
Potassium: 4.2 mmol/L (ref 3.5–5.1)
Sodium: 139 mmol/L (ref 135–145)
Total Bilirubin: 0.2 mg/dL — ABNORMAL LOW (ref 0.3–1.2)
Total Protein: 6.6 g/dL (ref 6.5–8.1)

## 2018-09-05 LAB — CBC WITH DIFFERENTIAL (CANCER CENTER ONLY)
Abs Immature Granulocytes: 0.02 10*3/uL (ref 0.00–0.07)
Basophils Absolute: 0 10*3/uL (ref 0.0–0.1)
Basophils Relative: 1 %
Eosinophils Absolute: 0.3 10*3/uL (ref 0.0–0.5)
Eosinophils Relative: 5 %
HCT: 38.7 % (ref 36.0–46.0)
Hemoglobin: 12.9 g/dL (ref 12.0–15.0)
Immature Granulocytes: 0 %
Lymphocytes Relative: 21 %
Lymphs Abs: 1.1 10*3/uL (ref 0.7–4.0)
MCH: 32.3 pg (ref 26.0–34.0)
MCHC: 33.3 g/dL (ref 30.0–36.0)
MCV: 96.8 fL (ref 80.0–100.0)
Monocytes Absolute: 0.5 10*3/uL (ref 0.1–1.0)
Monocytes Relative: 11 %
Neutro Abs: 3 10*3/uL (ref 1.7–7.7)
Neutrophils Relative %: 62 %
Platelet Count: 195 10*3/uL (ref 150–400)
RBC: 4 MIL/uL (ref 3.87–5.11)
RDW: 12.5 % (ref 11.5–15.5)
WBC Count: 4.9 10*3/uL (ref 4.0–10.5)
nRBC: 0 % (ref 0.0–0.2)

## 2018-09-05 MED ORDER — TAMOXIFEN CITRATE 20 MG PO TABS: 20.0000 mg | ORAL_TABLET | Freq: Every day | ORAL | 1 refills | Status: DC

## 2018-09-06 ENCOUNTER — Telehealth: Payer: Self-pay | Admitting: Hematology

## 2018-09-06 NOTE — Telephone Encounter (Signed)
Scheduled appt per 6/10 los. A calendar will be mailed out. °

## 2018-11-15 ENCOUNTER — Encounter: Payer: Self-pay | Admitting: Hematology

## 2019-02-25 ENCOUNTER — Other Ambulatory Visit: Payer: Self-pay | Admitting: Hematology

## 2019-02-25 DIAGNOSIS — C50211 Malignant neoplasm of upper-inner quadrant of right female breast: Secondary | ICD-10-CM

## 2019-03-06 NOTE — Progress Notes (Signed)
[place]   [phone] [phone]   Clinic Follow up Note   Patient Care Team: Moshe Cipro, MD as PCP - General (Internal Medicine) Lonzo Cloud, MD (General Practice) Fanny Skates, MD as Consulting Physician (General Surgery) Truitt Merle, MD as Consulting Physician (Hematology) Thea Silversmith, MD as Consulting Physician (Radiation Oncology) Rockwell Germany, RN as Registered Nurse Mauro Kaufmann, RN as Registered Nurse Holley Bouche, NP (Inactive) as Nurse Practitioner (Nurse Practitioner) Bary Leriche, MD as Referring Physician (Psychiatry) Erline Levine, MD as Consulting Physician (Neurosurgery)   I connected with Nena Polio on 03/08/2019 at 11:20 AM EST by telephone visit and verified that I am speaking with the correct person using two identifiers.  I discussed the limitations, risks, security and privacy concerns of performing an evaluation and management service by telephone and the availability of in person appointments. I also discussed with the patient that there may be a patient responsible charge related to this service. 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 Ultrasound of the right axilla is  negative   06/05/2014 Initial Biopsy   Right breast core needle bx: invasive mammary carcinoma, ER- (0%), PR- (0%), HER2/neu negative, Ki67: 98%   07/02/2014 PET scan   1. Intensely hypermetabolic mass in the medial RIGHT chest wall with hypermetabolic right axillary node 2. Hypermetabolic nodal metastasis in the LEFT level 2 and LEFT supraclavicular and periportal node (SUV 5).    07/04/2014 Pathology Results   Left breast mass biopsy showed DCIS, ER100%, PR 48%   07/10/2014 Pathology Results   Left [place] node biopsy was negative for malignant cells, benign reactive lymph tissue    07/10/2014 Clinical Stage   RIGHT: Stage IIA: T2 N0   LEFT:  Stage 0: Tis N0   07/18/2014 - 12/19/2014 Chemotherapy   Dose dense Adriamycin and Cytoxan, every 2 weeks, X4, followed by weekly carbo and Taxol for 12 weeks.   07/28/2014 Pathology Results   10R and 10L lymph node biopsy showed no malignant cells, mixed lymphoid population.   08/11/2014 Miscellaneous   nadir CBC: WBC 0.54, hemoglobin 8.9, hematocrit 26%, platelet 125   01/12/2015 Procedure   Breast/Ovarian ([facility]) panel: no clinically sig variant at ATM, BARD1, BRCA1, BRCA2, BRIP1, CDH1, CHEK2, EPCAM, FANCC, MLH1, MSH2, MSH6, NBN, PALB2, PMS2, PTEN, RAD51C, RAD51D, TP53, and XRCC2   01/15/2015 Surgery   Bilateral lumpectomy and right sentinel lymph node biopsy.   01/15/2015 Pathology Results   Bilateral breast lumpectomy showed fibrocystic change, treatment-related changes, no residual malignancy. 3 right axillary sentinel lymph nodes were negative.   02/24/2015 - 04/14/2015 Radiation Therapy   Right breast 45 Gy over 25 fractions; right supraclav fossa/axilla: 45 Gy over 25 fractions; right breast boost:  16 Gy over 8 fractions.  Total dose RIGHT: 61 Gy  Left breast: 50.4 Gy over 28 fractions.   04/2015 -  Anti-estrogen oral therapy   Tamoxifen 20 mg daily   07/02/2015 Survivorship   Survivorship visit completed   01/18/2016 PET scan   PET  01/18/2016 IMPRESSION: 1. Surgical changes in both breasts. No evidence of hypermetabolic residual or recurrent tumor. No explanation for left-sided scapular pain. 2. Low-level hypermetabolism about the right pectoralis musculature is in the region of surgical changes and favored to be treatment related. 3.  Coronary artery atherosclerosis. Aortic atherosclerosis   06/14/2016 Imaging   MM Bilateral Breast 06/14/16 IMPRESSION: No mammographic evidence for malignancy.     06/15/2017 Mammogram   IMPRESSION: No evidence of malignancy within either breast. Stable postsurgical changes within each breast.      CURRENT THERAPY:  Tamoxifen 20 mg once daily, started on Feb 2017. Plan to complete 5 years in 04/2020  INTERVAL HISTORY:  Kristin Pennington is here for a follow up of right breast cancer and left breast DCIS. She was last seen by me 6 months ago. She notes she continues to tolerate Tamoxifen. She notes she has significant venous insufficiency, 1 in there right leg and 2 in the left leg, with swelling. She is wearing compression socks and plans to have vein block. She notes stable pain from prior injury.    REVIEW OF SYSTEMS:   Constitutional: Denies fevers, chills or abnormal weight loss Eyes: Denies blurriness of vision Ears, nose, mouth, throat, and face: Denies mucositis or sore throat Respiratory: Denies cough, dyspnea or wheezes Cardiovascular: Denies palpitation, chest discomfort (+) B/l venous insufficiency  Gastrointestinal:  Denies nausea, heartburn or change in bowel habits Skin: Denies abnormal skin rashes Lymphatics: Denies new lymphadenopathy or easy bruising Neurological:Denies numbness, tingling or new weaknesses Behavioral/Psych: Mood is stable, no new changes  All other systems were reviewed with the patient and are negative.  MEDICAL HISTORY:  Past Medical History:  Diagnosis Date   Arthritis    knee   Breast cancer ([place])    bilateral, right breast mass,  cancer both breast, x1 chemo  a week ago.   GERD (gastroesophageal reflux disease)    with chemo   Glaucoma    "early" - eye doctor is watching   Personal history of radiation therapy    Pneumonia    2015   Seizures ([place])    last 2006  "hereditary" type   Wears glasses    Wears partial dentures    top    SURGICAL HISTORY: Past Surgical History:  Procedure Laterality Date   ABDOMINAL HYSTERECTOMY     APPENDECTOMY     BREAST BIOPSY     BREAST LUMPECTOMY Bilateral 2016   BREAST LUMPECTOMY WITH RADIOACTIVE SEED AND SENTINEL LYMPH NODE BIOPSY Right 01/15/2015   Procedure: RIGHT BREAST LUMPECTOMY WITH RADIOACTIVE SEED AND SENTINEL LYMPH NODE BIOPSY;  Surgeon: Fanny Skates, MD;  Location: [facility];  Service: General;  Laterality: Right;   BREAST LUMPECTOMY WITH RADIOACTIVE SEED LOCALIZATION Left 01/15/2015   Procedure: LEFT BREAST LUMPECTOMY WITH RADIOACTIVE SEED LOCALIZATION;  Surgeon: Fanny Skates, MD;  Location: [facility];  Service: General;  Laterality: Left;   CARPAL TUNNEL RELEASE Left    CHOLECYSTECTOMY     COLONOSCOPY     ENDOBRONCHIAL ULTRASOUND Bilateral 07/28/2014   Procedure: ENDOBRONCHIAL ULTRASOUND;  Surgeon: Collene Gobble, MD;  Location: [facility];  Service: Cardiopulmonary;  Laterality: Bilateral;   PORT-A-CATH REMOVAL Left 01/15/2015  Procedure: REMOVAL PORT-A-CATH;  Surgeon: Fanny Skates, MD;  Location: [place];  Service: General;  Laterality: Left;   PORTACATH PLACEMENT N/A 06/30/2014   Procedure: INSERTION PORT-A-CATH WITH ULTRA SOUND ON STANDBY;  Surgeon: Fanny Skates, MD;  Location: [place];  Service: General;  Laterality: N/A;   TONSILLECTOMY      I have reviewed the social history and family history with the patient and they are unchanged from previous note.  ALLERGIES:  is allergic to aspirin; codeine; meloxicam; nitrofurantoin; other; pyridium [phenazopyridine hcl]; sulfa antibiotics; tetracyclines & related; and  tape.  MEDICATIONS:  Current Outpatient Medications  Medication Sig Dispense Refill   Calcium Carb-Cholecalciferol (CALCIUM 600+D3) 600-400 MG-UNIT TABS Take 1 tablet by mouth 2 (two) times daily.     clotrimazole-betamethasone (LOTRISONE) cream      cyanocobalamin (,VITAMIN B-12,) 1000 MCG/ML injection Inject 1,000 mcg into the muscle every 30 (thirty) days.      diclofenac (VOLTAREN) 50 MG EC tablet Take 50 mg by mouth 2 (two) times daily.     levETIRAcetam (KEPPRA) 500 MG tablet Take 1 tablet by mouth 2 (two) times daily.     oxaprozin (DAYPRO) 600 MG tablet Take 1 tablet by mouth 2 (two) times daily. Reported on 07/27/2015     phenytoin (DILANTIN) 100 MG ER capsule Take 1 tablet by mouth 4 (four) times daily. On  Sat , Sun, Tues, Wed, and  Thursdays    -  Take   168m  QID.     sodium chloride (OCEAN) 0.65 % SOLN nasal spray Place 1 spray into both nostrils as needed for congestion.     tamoxifen (NOLVADEX) 20 MG tablet TAKE 1 TABLET DAILY 90 tablet 1   Tetrahydrozoline HCl ([employer] EYE DROPS OP) Apply 1 drop to eye 3 (three) times daily.     No current facility-administered medications for this visit.    PHYSICAL EXAMINATION: ECOG PERFORMANCE STATUS: 1 - Symptomatic but completely ambulatory  No vitals taken today, Exam not performed today   LABORATORY DATA:  I have reviewed the data as listed CBC Latest Ref Rng & Units 09/05/2018 01/22/2018 07/24/2017  WBC 4.0 - 10.5 K/uL 4.9 4.9 4.9  Hemoglobin 12.0 - 15.0 g/dL 12.9 12.6 12.9  Hematocrit 36.0 - 46.0 % 38.7 38.9 38.4  Platelets 150 - 400 K/uL 195 210 206     CMP Latest Ref Rng & Units 09/05/2018 01/22/2018 07/24/2017  Glucose 70 - 99 mg/dL 76 80 84  BUN 8 - 23 mg/dL _0 Creatinine 0.44 - 1.00 mg/dL 0.74 0.73 0.72  Sodium 135 - 145 mmol/L 139 142 139  Potassium 3.5 - 5.1 mmol/L 4.2 4.3 4.0  Chloride 98 - 111 mmol/L 104 105 104  CO2 22 - 32 mmol/L _1 Calcium 8.9 - 10.3 mg/dL 8.5(L) 9.1 8.9  Total Protein  6.5 - 8.1 g/dL 6.6 6.8 6.6  Total Bilirubin 0.3 - 1.2 mg/dL 0.2(L) <0.2(L) <0.2(L)  Alkaline Phos 38 - 126 U/L 72 68 76  AST 15 - 41 U/L 13(L) 14(L) 15  ALT 0 - 44 U/L _2 RADIOGRAPHIC STUDIES: I have personally reviewed the radiological images as listed and agreed with the findings in the report. No results found.   ASSESSMENT & PLAN:  RJoline Encaladais a 73y.o. female with   1. Poorly differentiated mammary carcinoma in her right breast, cT2NxM0, triple negative, ypT0N0 2. Left breast DCIS, ER+/PR+  -She was  diagnosed in 05/2014 with right breast cancer. She was diagnosed in 06/2014 with left breast DCIS. She is s/p neoadjuvant AC-T, B/l lumpectomies, B/l adjuvant radiation.  -The left breast DCIS had no residual tumor on the surgical sample, it was ER/PR strongly positive. -She has been on anti-estrogen therapy with Tamoxifen since 04/2015. Tolerating well with manageable hot flashes. Will continue for total of 5 years in 04/2020 -She is clinically doing well and no concerns about her cancer. Her 05/2018 mammogram was unremarkable.  -Continue surveillance. Next screening mammogram in 05/2019 -Continue Tamoxifen  -F/u in 6 months   3. Cervical spine stenosis -She is seeing her orthopedic surgeon Dr. Megan Salon and neurosurgeon Dr. 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 Vertell Limber  4. Hot flush -Secondary to tamoxifen. Manageable.   5. Bone Health  -Had Bone density in 2017 with Dr. Megan Salon her Orthopedist, patient notes results were normal  -She had DEXA with Orthopedist in 04/2018. Per pt it was normal.  -She is taking Calcium and Vit D. Will continue   6. Chronic Back Pain after a fall in 08/2016 [place] on back due to equilibrium imbalance in 08/2016 -Manageable with aspirin, Ambulated independently but has cane or walker if  needed.  -Stable.   7. Genetic Testing negative   8. History of seizure -on dilantin and Keppra   9. B/l leg venous insufficiency  -She wears compression socks and ambulates well.  -She will f/u with her Physician. She plans to have vein block soon.  -I encouraged her to remain active. I discussed if she needs surgery or limited activity will hold Tamoxifen.   Plan: -She is clinically doing well  -Continue Tamoxifen  -Screening Mammogram in 05/2019 -lab and f/u in 6 months    No problem-specific Assessment & Plan notes found for this encounter.   Orders Placed This Encounter  Procedures   MM Digital Screening    Standing Status:   Future    Standing Expiration Date:   03/07/2020    Order Specific Question:   Reason for Exam (SYMPTOM  OR DIAGNOSIS REQUIRED)    Answer:   screening    Order Specific Question:   Preferred imaging location?    Answer:   [facility]   I discussed the assessment and treatment plan with the patient. The patient was provided an opportunity to ask questions and all were answered. The patient agreed with the plan and demonstrated an understanding of the instructions.  The patient was advised to call back or seek an in-person evaluation if the symptoms worsen or if the condition fails to improve as anticipated.  I provided 12 minutes of non face-to-face telephone visit time during this encounter, and > 50% was spent counseling as documented under my assessment & plan.    Truitt Merle, MD 03/08/2019   I, Joslyn Devon, am acting as scribe for Truitt Merle, MD.   I have reviewed the above documentation for accuracy and completeness, and I agree with the above.

## 2019-03-08 ENCOUNTER — Encounter: Payer: Self-pay | Admitting: Hematology

## 2019-03-08 ENCOUNTER — Inpatient Hospital Stay: Payer: Medicare Other | Attending: Hematology | Admitting: Hematology

## 2019-03-08 ENCOUNTER — Inpatient Hospital Stay: Payer: Medicare Other

## 2019-03-08 DIAGNOSIS — D0512 Intraductal carcinoma in situ of left breast: Secondary | ICD-10-CM

## 2019-03-08 DIAGNOSIS — Z1231 Encounter for screening mammogram for malignant neoplasm of breast: Secondary | ICD-10-CM | POA: Diagnosis not present

## 2019-03-08 DIAGNOSIS — Z171 Estrogen receptor negative status [ER-]: Secondary | ICD-10-CM | POA: Diagnosis not present

## 2019-03-08 DIAGNOSIS — C50211 Malignant neoplasm of upper-inner quadrant of right female breast: Secondary | ICD-10-CM

## 2019-03-11 ENCOUNTER — Telehealth: Payer: Self-pay | Admitting: Hematology

## 2019-03-11 NOTE — Telephone Encounter (Signed)
Scheduled appt per 12/11 los.  Spoke with pt and she is aware of her appt date and time.

## 2019-05-08 ENCOUNTER — Other Ambulatory Visit: Payer: Self-pay | Admitting: Hematology

## 2019-05-08 DIAGNOSIS — Z853 Personal history of malignant neoplasm of breast: Secondary | ICD-10-CM

## 2019-07-10 ENCOUNTER — Other Ambulatory Visit: Payer: Self-pay

## 2019-07-10 DIAGNOSIS — C50211 Malignant neoplasm of upper-inner quadrant of right female breast: Secondary | ICD-10-CM

## 2019-07-10 DIAGNOSIS — Z171 Estrogen receptor negative status [ER-]: Secondary | ICD-10-CM

## 2019-07-10 MED ORDER — TAMOXIFEN CITRATE 20 MG PO TABS: 20.0000 mg | ORAL_TABLET | Freq: Every day | ORAL | 3 refills | Status: AC

## 2019-07-10 NOTE — Progress Notes (Signed)
Ms Witcher called requesting Rx for Tamoxifen be sent to [facility]. Ms Diskin notified.

## 2019-07-18 ENCOUNTER — Ambulatory Visit
Admission: RE | Admit: 2019-07-18 | Discharge: 2019-07-18 | Disposition: A | Discharge status: Home / Self Care | Payer: Medicare Other | Source: Ambulatory Visit | Attending: Hematology | Admitting: Hematology

## 2019-07-18 ENCOUNTER — Other Ambulatory Visit: Payer: Self-pay

## 2019-07-18 DIAGNOSIS — Z853 Personal history of malignant neoplasm of breast: Secondary | ICD-10-CM

## 2019-09-05 NOTE — Progress Notes (Signed)
[place]   [phone] [phone]   Clinic Follow up Note   Patient Care Team: Moshe Cipro, MD as PCP - General (Internal Medicine) Lonzo Cloud, MD (General Practice) Fanny Skates, MD as Consulting Physician (General Surgery) Truitt Merle, MD as Consulting Physician (Hematology) Thea Silversmith, MD as Consulting Physician (Radiation Oncology) Rockwell Germany, RN as Registered Nurse Mauro Kaufmann, RN as Registered Nurse Holley Bouche, NP (Inactive) as Nurse Practitioner (Nurse Practitioner) Bary Leriche, MD as Referring Physician (Psychiatry) Erline Levine, MD as Consulting Physician (Neurosurgery)  Date of Service:  09/06/2019  CHIEF COMPLAINT: 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 Ultrasound of the right axilla is negative   06/05/2014 Initial Biopsy   Right breast core needle bx: invasive mammary carcinoma, ER- (0%), PR- (0%), HER2/neu negative, Ki67: 98%   07/02/2014 PET scan   1. Intensely hypermetabolic mass in the medial RIGHT chest wall with hypermetabolic right axillary node 2. Hypermetabolic nodal metastasis in the LEFT level 2 and LEFT supraclavicular and periportal node (SUV 5).    07/04/2014 Pathology Results   Left breast mass biopsy showed DCIS, ER100%, PR 48%   07/10/2014 Pathology Results   Left Schriever node biopsy was negative for  malignant cells, benign reactive lymph tissue    07/10/2014 Clinical Stage   RIGHT: Stage IIA: T2 N0   LEFT:  Stage 0: Tis N0   07/18/2014 - 12/19/2014 Chemotherapy   Dose dense Adriamycin and Cytoxan, every 2 weeks, X4, followed by weekly carbo and Taxol for 12 weeks.   07/28/2014 Pathology Results   10R and 10L lymph node biopsy showed no malignant cells, mixed lymphoid population.   08/11/2014 Miscellaneous   nadir CBC: WBC 0.54, hemoglobin 8.9, hematocrit 26%, platelet 125   01/12/2015 Procedure   Breast/Ovarian ([facility]) panel: no clinically sig variant at ATM, BARD1, BRCA1, BRCA2, BRIP1, CDH1, CHEK2, EPCAM, FANCC, MLH1, MSH2, MSH6, NBN, PALB2, PMS2, PTEN, RAD51C, RAD51D, TP53, and XRCC2   01/15/2015 Surgery   Bilateral lumpectomy and right sentinel lymph node biopsy.   01/15/2015 Pathology Results   Bilateral breast lumpectomy showed fibrocystic change, treatment-related changes, no residual malignancy. 3 right axillary sentinel lymph nodes were negative.   02/24/2015 - 04/14/2015 Radiation Therapy   Right breast 45 Gy over 25 fractions; right supraclav fossa/axilla: 45 Gy over 25 fractions; right breast boost: 16 Gy over 8 fractions.  Total dose RIGHT: 61 Gy  Left breast: 50.4 Gy over 28 fractions.   04/2015 - 04/2020 Anti-estrogen oral therapy   Tamoxifen 20 mg daily. Completed 5 years in 04/2020   07/02/2015 Survivorship   Survivorship visit completed   01/18/2016 PET scan   PET 01/18/2016 IMPRESSION: 1. Surgical changes in both breasts. No evidence of hypermetabolic residual or recurrent tumor. No explanation for left-sided scapular pain. 2. Low-level hypermetabolism about the right pectoralis musculature is in  the region of surgical changes and favored to be treatment related. 3.  Coronary artery atherosclerosis. Aortic atherosclerosis   06/14/2016 Imaging   MM Bilateral Breast 06/14/16 IMPRESSION: No mammographic evidence for malignancy.     06/15/2017 Mammogram    IMPRESSION: No evidence of malignancy within either breast. Stable postsurgical changes within each breast.      CURRENT THERAPY:  Tamoxifen 20 mg once daily, started on Feb 2017. Plan to complete 5 years in 04/2020  INTERVAL HISTORY:  Kristin Pennington is here for a follow up of right breast cancer and left breast DCIS. She presents to the clinic with her family member. She notes she is doing well. She notes she is tolerating Tamoxifen well with no vaginal bleeding or discharge. She has left wrist pain and lateral nodule on her wrists which is growing. She plans to see orthopedist about this soon. She notes appetite and energy level is adequate, weight stable.     REVIEW OF SYSTEMS:   Constitutional: Denies fevers, chills or abnormal weight loss Eyes: Denies blurriness of vision Ears, nose, mouth, throat, and face: Denies mucositis or sore throat Respiratory: Denies cough, dyspnea or wheezes Cardiovascular: Denies palpitation, chest discomfort or lower extremity swelling Gastrointestinal:  Denies nausea, heartburn or change in bowel habits Skin: Denies abnormal skin rashes MSK: (+) Left lateral wrist nodule with pain.  Lymphatics: Denies new lymphadenopathy or easy bruising Neurological:Denies numbness, tingling or new weaknesses Behavioral/Psych: Mood is stable, no new changes  All other systems were reviewed with the patient and are negative.  MEDICAL HISTORY:  Past Medical History:  Diagnosis Date  . Arthritis    knee  . Breast cancer ([place])    bilateral, right breast mass, cancer both breast, x1 chemo  a week ago.  Marland Kitchen GERD (gastroesophageal reflux disease)    with chemo  . Glaucoma    "early" - eye doctor is watching  . Personal history of radiation therapy   . Pneumonia    2015  . Seizures ([place])    last 2006  "hereditary" type  . Wears glasses   . Wears partial dentures    top    SURGICAL HISTORY: Past Surgical History:  Procedure Laterality Date  . ABDOMINAL  HYSTERECTOMY    . APPENDECTOMY    . BREAST BIOPSY    . BREAST LUMPECTOMY Bilateral 2016  . BREAST LUMPECTOMY WITH RADIOACTIVE SEED AND SENTINEL LYMPH NODE BIOPSY Right 01/15/2015   Procedure: RIGHT BREAST LUMPECTOMY WITH RADIOACTIVE SEED AND SENTINEL LYMPH NODE BIOPSY;  Surgeon: Fanny Skates, MD;  Location: [facility];  Service: General;  Laterality: Right;  . BREAST LUMPECTOMY WITH RADIOACTIVE SEED LOCALIZATION Left 01/15/2015   Procedure: LEFT BREAST LUMPECTOMY WITH RADIOACTIVE SEED LOCALIZATION;  Surgeon: Fanny Skates, MD;  Location: [facility];  Service: General;  Laterality: Left;  . CARPAL TUNNEL RELEASE Left   . CHOLECYSTECTOMY    . COLONOSCOPY    . ENDOBRONCHIAL ULTRASOUND Bilateral 07/28/2014   Procedure: ENDOBRONCHIAL ULTRASOUND;  Surgeon: Collene Gobble, MD;  Location: [facility];  Service: Cardiopulmonary;  Laterality: Bilateral;  . PORT-A-CATH REMOVAL Left 01/15/2015   Procedure: REMOVAL PORT-A-CATH;  Surgeon: Fanny Skates, MD;  Location: [facility];  Service: General;  Laterality: Left;  . PORTACATH PLACEMENT N/A 06/30/2014   Procedure: INSERTION PORT-A-CATH WITH ULTRA SOUND ON STANDBY;  Surgeon: Fanny Skates, MD;  Location: [facility];  Service: General;  Laterality: N/A;  . TONSILLECTOMY      I have reviewed the social history and family  history with the patient and they are unchanged from previous note.  ALLERGIES:  is allergic to aspirin, codeine, meloxicam, nitrofurantoin, other, pyridium [phenazopyridine hcl], sulfa antibiotics, tetracyclines & related, and tape.  MEDICATIONS:  Current Outpatient Medications  Medication Sig Dispense Refill  . Calcium Carb-Cholecalciferol (CALCIUM 600+D3) 600-400 MG-UNIT TABS Take 1 tablet by mouth 2 (two) times daily.    . clotrimazole-betamethasone (LOTRISONE) cream     . cyanocobalamin (,VITAMIN B-12,) 1000 MCG/ML injection Inject 1,000 mcg into the muscle every 30 (thirty) days.     . diclofenac (VOLTAREN) 50 MG EC tablet  Take 50 mg by mouth 2 (two) times daily.    Marland Kitchen levETIRAcetam (KEPPRA) 500 MG tablet Take 1 tablet by mouth 2 (two) times daily.    Marland Kitchen oxaprozin (DAYPRO) 600 MG tablet Take 1 tablet by mouth 2 (two) times daily. Reported on 07/27/2015    . phenytoin (DILANTIN) 100 MG ER capsule Take 1 tablet by mouth 4 (four) times daily. On  Sat , Sun, Tues, Wed, and  Thursdays    -  Take   153m  QID.    .Marland Kitchensodium chloride (OCEAN) 0.65 % SOLN nasal spray Place 1 spray into both nostrils as needed for congestion.    . tamoxifen (NOLVADEX) 20 MG tablet Take 1 tablet (20 mg total) by mouth daily. 90 tablet 3  . Tetrahydrozoline HCl ([employer] EYE DROPS OP) Apply 1 drop to eye 3 (three) times daily.     No current facility-administered medications for this visit.    PHYSICAL EXAMINATION: ECOG PERFORMANCE STATUS: 0 - Asymptomatic  Vitals:   09/06/19 0911  BP: 123/71  Pulse: 70  Resp: 17  Temp: 98 F (36.7 C)  SpO2: 98%   Filed Weights   09/06/19 0911  Weight: 157 lb (71.2 kg)   GENERAL:alert, no distress and comfortable SKIN: skin color, texture, turgor are normal, no rashes or significant lesions EYES: normal, Conjunctiva are pink and non-injected, sclera clear  NECK: supple, thyroid normal size, non-tender, without nodularity LYMPH:  no palpable lymphadenopathy in the cervical, axillary  LUNGS: clear to auscultation and percussion with normal breathing effort HEART: regular rate & rhythm and no murmurs and no lower extremity edema ABDOMEN:abdomen soft, non-tender and normal bowel sounds Musculoskeletal:no cyanosis of digits and no clubbing  NEURO: alert & oriented x 3 with fluent speech, no focal motor/sensory deficits BREAST: S/p b/l lumpectomies: Surgical incisions healed well with mild right breast scar tissue. No palpable mass, nodules or adenopathy bilaterally. Breast exam benign.   LABORATORY DATA:  I have reviewed the data as listed CBC Latest Ref Rng & Units 09/06/2019 09/05/2018 01/22/2018  WBC  4.0 - 10.5 K/uL 4.9 4.9 4.9  Hemoglobin 12.0 - 15.0 g/dL 13.0 12.9 12.6  Hematocrit 36 - 46 % 39.5 38.7 38.9  Platelets 150 - 400 K/uL 210 195 210     CMP Latest Ref Rng & Units 09/06/2019 09/05/2018 01/22/2018  Glucose 70 - 99 mg/dL 79 76 80  BUN 8 - 23 mg/dL _0 Creatinine 0.44 - 1.00 mg/dL 0.75 0.74 0.73  Sodium 135 - 145 mmol/L 142 139 142  Potassium 3.5 - 5.1 mmol/L 4.4 4.2 4.3  Chloride 98 - 111 mmol/L 103 104 105  CO2 22 - 32 mmol/L _1 Calcium 8.9 - 10.3 mg/dL 9.1 8.5(L) 9.1  Total Protein 6.5 - 8.1 g/dL 6.7 6.6 6.8  Total Bilirubin 0.3 - 1.2 mg/dL 0.2(L) 0.2(L) <0.2(L)  Alkaline Phos 38 - 126  U/L 71 72 68  AST 15 - 41 U/L 13(L) 13(L) 14(L)  ALT 0 - 44 U/L _0 RADIOGRAPHIC STUDIES: I have personally reviewed the radiological images as listed and agreed with the findings in the report. No results found.   ASSESSMENT & PLAN:  Kameelah Minish is a 74 y.o. female with    1. Poorly differentiated mammary carcinoma in her right breast, cT2NxM0, triple negative, ypT0N0, and left breast DCIS, ER+/PR+ -She was diagnosed in 05/2014 with right breast cancer, triple negative. She wasdiagnosedin 06/2014 with left breast DCIS. She is s/p neoadjuvant AC-T, B/l lumpectomies, B/l adjuvant radiation.  -The left breast DCIS had no residual tumor on the surgical sample, it was ER/PR strongly positive. -She has been on anti-estrogen therapy with Tamoxifen since 04/2015. Tolerating well withmanageablehot flashes. Will continue for total of 5 years in 04/2020 -She is clinically doing well. Lab reviewed, her CBC and CMP are within normal limits except albumin 3.3. Her physical exam and her 06/2019 mammogram were unremarkable. There is no clinical concern for recurrence.  -I encouraged her to eat more protein.  -She 5 years since her diagnosis. Continue Surveillance. Next Mammogram in 06/2020. I discussed additional screening with Breast MRI due to her triple negative breast  cancer and Breast Density C. She would alternate scans every 6 months. She is interested.  -Continue Tamoxifen until 04/2020 -Will do phone visit in 1 year. Then release her care to her PCP.   2. Cervical spine stenosis -She is seeing her orthopedic surgeon Dr. Megan Salon and neurosurgeon Dr. 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 Vertell Limber  3. Hot flush -Secondary to tamoxifen.Manageable.  4. Bone Health  -Had Bone density in 2017 with Dr. Megan Salon her Orthopedist, patient notes results were normal  -She plans to have her next DEXA with Orthopedist in 04/2020 in [place]. -She is taking Calcium and Vit D.Will continue  5.ChronicBack Pain after a fall in 08/2016 [place] on back due to equilibrium imbalance in 08/2016 -Manageable with aspirin, Ambulated independently but has cane or walker if needed. -Stable.   6. GeneticTesting negative  7. History of seizure -on dilantinand Keppra  8. B/l leg venous insufficiency  -She wears compression socks and ambulates well.  -She will f/u with her Physician. She plans to have vein block soon.  -I encouraged her to remain active. I discussed if she needs surgery or limited activity will hold Tamoxifen.   Plan -She is clinically doing well  -Continue Tamoxifenuntil 04/2020, refilled today  -MRI breast in 12/2019 and Mammogram in 06/2020 -Phone Visit in 1 year   No problem-specific Assessment & Plan notes found for this encounter.   Orders Placed This Encounter  Procedures  . MM DIAG BREAST TOMO BILATERAL    Standing Status:   Future    Standing Expiration Date:   09/07/2020    Order Specific Question:   Reason for Exam (SYMPTOM  OR DIAGNOSIS REQUIRED)    Answer:   screening    Order Specific Question:   Preferred imaging location?    Answer:   [facility]  . MR BREAST  BILATERAL W WO CONTRAST INC CAD    Standing Status:   Future    Standing Expiration Date:   09/07/2020    Order Specific Question:   If indicated for the ordered procedure, I authorize  the administration of contrast media per Radiology protocol    Answer:   Yes    Order Specific Question:   What is the patient's sedation requirement?    Answer:   No Sedation    Order Specific Question:   Does the patient have a pacemaker or implanted devices?    Answer:   No    Order Specific Question:   Radiology Contrast Protocol - do NOT remove file path    Answer:   [email or web address]    Order Specific Question:   Preferred imaging location?    Answer:   [facility] (table limit-550lbs)   All questions were answered. The patient knows to call the clinic with any problems, questions or concerns. No barriers to learning was detected. The total time spent in the appointment was 25 minutes.     Truitt Merle, MD 09/06/2019   I, Joslyn Devon, am acting as scribe for Truitt Merle, MD.   I have reviewed the above documentation for accuracy and completeness, and I agree with the above.

## 2019-09-06 ENCOUNTER — Telehealth: Payer: Self-pay | Admitting: Hematology

## 2019-09-06 ENCOUNTER — Inpatient Hospital Stay (HOSPITAL_BASED_OUTPATIENT_CLINIC_OR_DEPARTMENT_OTHER): Payer: Medicare Other | Admitting: Hematology

## 2019-09-06 ENCOUNTER — Inpatient Hospital Stay: Payer: Medicare Other | Attending: Hematology

## 2019-09-06 ENCOUNTER — Other Ambulatory Visit: Payer: Self-pay

## 2019-09-06 VITALS — BP 123/71 | HR 70 | Temp 98.0°F | Resp 17 | Ht 66.0 in | Wt 157.0 lb

## 2019-09-06 DIAGNOSIS — Z923 Personal history of irradiation: Secondary | ICD-10-CM | POA: Diagnosis not present

## 2019-09-06 DIAGNOSIS — Z79899 Other long term (current) drug therapy: Secondary | ICD-10-CM | POA: Insufficient documentation

## 2019-09-06 DIAGNOSIS — D0512 Intraductal carcinoma in situ of left breast: Secondary | ICD-10-CM

## 2019-09-06 DIAGNOSIS — Z171 Estrogen receptor negative status [ER-]: Secondary | ICD-10-CM | POA: Diagnosis not present

## 2019-09-06 DIAGNOSIS — C50211 Malignant neoplasm of upper-inner quadrant of right female breast: Secondary | ICD-10-CM

## 2019-09-06 DIAGNOSIS — Z86000 Personal history of in-situ neoplasm of breast: Secondary | ICD-10-CM | POA: Insufficient documentation

## 2019-09-06 DIAGNOSIS — Z791 Long term (current) use of non-steroidal anti-inflammatories (NSAID): Secondary | ICD-10-CM | POA: Diagnosis not present

## 2019-09-06 DIAGNOSIS — Z853 Personal history of malignant neoplasm of breast: Secondary | ICD-10-CM | POA: Insufficient documentation

## 2019-09-06 DIAGNOSIS — Z170 Estrogen receptor positive status [ER+]: Secondary | ICD-10-CM | POA: Insufficient documentation

## 2019-09-06 DIAGNOSIS — Z9221 Personal history of antineoplastic chemotherapy: Secondary | ICD-10-CM | POA: Diagnosis not present

## 2019-09-06 LAB — CBC WITH DIFFERENTIAL (CANCER CENTER ONLY)
Abs Immature Granulocytes: 0.03 10*3/uL (ref 0.00–0.07)
Basophils Absolute: 0 10*3/uL (ref 0.0–0.1)
Basophils Relative: 1 %
Eosinophils Absolute: 0.3 10*3/uL (ref 0.0–0.5)
Eosinophils Relative: 6 %
HCT: 39.5 % (ref 36.0–46.0)
Hemoglobin: 13 g/dL (ref 12.0–15.0)
Immature Granulocytes: 1 %
Lymphocytes Relative: 23 %
Lymphs Abs: 1.1 10*3/uL (ref 0.7–4.0)
MCH: 31.5 pg (ref 26.0–34.0)
MCHC: 32.9 g/dL (ref 30.0–36.0)
MCV: 95.6 fL (ref 80.0–100.0)
Monocytes Absolute: 0.6 10*3/uL (ref 0.1–1.0)
Monocytes Relative: 11 %
Neutro Abs: 2.9 10*3/uL (ref 1.7–7.7)
Neutrophils Relative %: 58 %
Platelet Count: 210 10*3/uL (ref 150–400)
RBC: 4.13 MIL/uL (ref 3.87–5.11)
RDW: 12.2 % (ref 11.5–15.5)
WBC Count: 4.9 10*3/uL (ref 4.0–10.5)
nRBC: 0 % (ref 0.0–0.2)

## 2019-09-06 LAB — CMP (CANCER CENTER ONLY)
ALT: 9 U/L (ref 0–44)
AST: 13 U/L — ABNORMAL LOW (ref 15–41)
Albumin: 3.3 g/dL — ABNORMAL LOW (ref 3.5–5.0)
Alkaline Phosphatase: 71 U/L (ref 38–126)
Anion gap: 9 (ref 5–15)
BUN: 16 mg/dL (ref 8–23)
CO2: 30 mmol/L (ref 22–32)
Calcium: 9.1 mg/dL (ref 8.9–10.3)
Chloride: 103 mmol/L (ref 98–111)
Creatinine: 0.75 mg/dL (ref 0.44–1.00)
GFR, Est AFR Am: >60 mL/min (ref >60)
GFR, Estimated: >60 mL/min (ref >60)
Glucose, Bld: 79 mg/dL (ref 70–99)
Potassium: 4.4 mmol/L (ref 3.5–5.1)
Sodium: 142 mmol/L (ref 135–145)
Total Bilirubin: 0.2 mg/dL — ABNORMAL LOW (ref 0.3–1.2)
Total Protein: 6.7 g/dL (ref 6.5–8.1)

## 2019-09-06 NOTE — Telephone Encounter (Signed)
Scheduled appt per 6/11 los.  Left a vm of the appt date and time.

## 2019-09-08 ENCOUNTER — Encounter: Payer: Self-pay | Admitting: Hematology

## 2019-10-14 ENCOUNTER — Other Ambulatory Visit: Payer: Self-pay

## 2019-10-14 ENCOUNTER — Ambulatory Visit
Admission: RE | Admit: 2019-10-14 | Discharge: 2019-10-14 | Disposition: A | Discharge status: Home / Self Care | Payer: Medicare Other | Source: Ambulatory Visit | Attending: Hematology | Admitting: Hematology

## 2019-10-14 DIAGNOSIS — Z171 Estrogen receptor negative status [ER-]: Secondary | ICD-10-CM

## 2019-10-14 DIAGNOSIS — D0512 Intraductal carcinoma in situ of left breast: Secondary | ICD-10-CM

## 2019-10-14 MED ORDER — GADOBUTROL 1 MMOL/ML IV SOLN
7.0000 mL | Freq: Once | INTRAVENOUS | Status: AC | PRN
  Administered 2019-10-14: 7 mL via INTRAVENOUS

## 2019-10-15 ENCOUNTER — Telehealth: Payer: Self-pay

## 2019-10-15 NOTE — Telephone Encounter (Signed)
Called Kristin Pennington and LM on Home and Cell number (VM verified it was Pt) notifying her that her MRI breast was negative. Informed [name] to call back with any questions or concerns.

## 2019-10-15 NOTE — Telephone Encounter (Signed)
-----   Message from Truitt Merle, MD sent at 10/15/2019 10:42 AM EDT ----- Please let pt know her breast MRI was negative, good news  Truitt Merle

## 2020-06-15 ENCOUNTER — Other Ambulatory Visit: Payer: Self-pay | Admitting: Hematology

## 2020-06-15 DIAGNOSIS — Z9889 Other specified postprocedural states: Secondary | ICD-10-CM

## 2020-06-24 ENCOUNTER — Other Ambulatory Visit: Payer: Self-pay | Admitting: Nurse Practitioner

## 2020-06-24 DIAGNOSIS — Z171 Estrogen receptor negative status [ER-]: Secondary | ICD-10-CM

## 2020-06-24 DIAGNOSIS — C50211 Malignant neoplasm of upper-inner quadrant of right female breast: Secondary | ICD-10-CM

## 2020-07-20 ENCOUNTER — Other Ambulatory Visit: Payer: Self-pay | Admitting: Hematology

## 2020-07-20 DIAGNOSIS — C50211 Malignant neoplasm of upper-inner quadrant of right female breast: Secondary | ICD-10-CM

## 2020-07-20 DIAGNOSIS — Z171 Estrogen receptor negative status [ER-]: Secondary | ICD-10-CM

## 2020-07-23 ENCOUNTER — Other Ambulatory Visit: Payer: Self-pay

## 2020-07-23 ENCOUNTER — Other Ambulatory Visit: Payer: Self-pay | Admitting: Hematology

## 2020-07-23 ENCOUNTER — Ambulatory Visit
Admission: RE | Admit: 2020-07-23 | Discharge: 2020-07-23 | Disposition: A | Discharge status: Home / Self Care | Payer: Medicare Other | Source: Ambulatory Visit | Attending: Hematology | Admitting: Hematology

## 2020-07-23 DIAGNOSIS — Z171 Estrogen receptor negative status [ER-]: Secondary | ICD-10-CM

## 2020-07-23 DIAGNOSIS — C50211 Malignant neoplasm of upper-inner quadrant of right female breast: Secondary | ICD-10-CM

## 2020-08-31 NOTE — Progress Notes (Signed)
[place]   Telephone:([phone]) [phone] Fax:([phone]) [phone]   Clinic Follow up Note   Patient Care Team: Moshe Cipro, MD as PCP - General (Internal Medicine) Lonzo Cloud, MD (General Practice) Fanny Skates, MD as Consulting Physician (General Surgery) Truitt Merle, MD as Consulting Physician (Hematology) Thea Silversmith, MD as Consulting Physician (Radiation Oncology) Rockwell Germany, RN as Registered Nurse Mauro Kaufmann, RN as Registered Nurse Holley Bouche, NP (Inactive) as Nurse Practitioner (Nurse Practitioner) Bary Leriche, MD as Referring Physician (Psychiatry) Erline Levine, MD as Consulting Physician (Neurosurgery)  Date of Service:  09/04/2020   CHIEF COMPLAINT: 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 Ultrasound of the right axilla is negative    06/05/2014 Initial Biopsy   Right breast core needle bx: invasive mammary carcinoma, ER- (0%), PR- (0%), HER2/neu negative, Ki67: 98%    07/02/2014 PET scan   1. Intensely hypermetabolic mass in the medial RIGHT chest wall with hypermetabolic right axillary node 2. Hypermetabolic nodal metastasis in the LEFT level 2 and LEFT supraclavicular and periportal node (SUV 5).     07/04/2014 Pathology Results   Left breast mass biopsy showed DCIS, ER100%, PR 48%    07/10/2014 Pathology Results   Left [place] node biopsy was  negative for malignant cells, benign reactive lymph tissue     07/10/2014 Clinical Stage   RIGHT: Stage IIA: T2 N0   LEFT:  Stage 0: Tis N0    07/18/2014 - 12/19/2014 Chemotherapy   Dose dense Adriamycin and Cytoxan, every 2 weeks, X4, followed by weekly carbo and Taxol for 12 weeks.    07/28/2014 Pathology Results   10R and 10L lymph node biopsy showed no malignant cells, mixed lymphoid population.    08/11/2014 Miscellaneous   nadir CBC: WBC 0.54, hemoglobin 8.9, hematocrit 26%, platelet 125    01/12/2015 Procedure   Breast/Ovarian ([facility]) panel: no clinically sig variant at ATM, BARD1, BRCA1, BRCA2, BRIP1, CDH1, CHEK2, EPCAM, FANCC, MLH1, MSH2, MSH6, NBN, PALB2, PMS2, PTEN, RAD51C, RAD51D, TP53, and XRCC2    01/15/2015 Surgery   Bilateral lumpectomy and right sentinel lymph node biopsy.    01/15/2015 Pathology Results   Bilateral breast lumpectomy showed fibrocystic change, treatment-related changes, no residual malignancy. 3 right axillary sentinel lymph nodes were negative.    02/24/2015 - 04/14/2015 Radiation Therapy   Right breast 45 Gy over 25 fractions; right supraclav fossa/axilla: 45 Gy over 25 fractions; right breast boost: 16 Gy over 8 fractions.  Total dose RIGHT: 61 Gy  Left breast: 50.4 Gy over 28 fractions.    04/2015 - 06/2020 Anti-estrogen oral therapy   Tamoxifen 20 mg daily.    07/02/2015 Survivorship   Survivorship visit completed    01/18/2016 PET scan   PET 01/18/2016 IMPRESSION: 1. Surgical changes in both breasts. No evidence of hypermetabolic residual or recurrent tumor. No explanation for  left-sided scapular pain. 2. Low-level hypermetabolism about the right pectoralis musculature is in the region of surgical changes and favored to be treatment related. 3.  Coronary artery atherosclerosis. Aortic atherosclerosis    06/14/2016 Imaging   MM Bilateral Breast 06/14/16 IMPRESSION: No mammographic evidence for malignancy.     06/15/2017 Mammogram    IMPRESSION: No evidence of malignancy within either breast. Stable postsurgical changes within each breast.       CURRENT THERAPY:  Surveillance   INTERVAL HISTORY:  Kristin Pennington presents for a virtual follow up of right breast cancer. She was last seen by me 1 year ago. She presents to the clinic alone. She notes she completed her Tamoxifen in 06/2020 when she completed the bottle. She notes she has tingling of bottom of feet and since being off Tamoxifen this is improving. Her arthritis pain is stable and manageable. She notes she had blood work with her PCP in recent months. She notes she follows up with her PCP twice a year. She notes she has Gyn in [place].    REVIEW OF SYSTEMS:   Constitutional: Denies fevers, chills or abnormal weight loss Eyes: Denies blurriness of vision Ears, nose, mouth, throat, and face: Denies mucositis or sore throat Respiratory: Denies cough, dyspnea or wheezes Cardiovascular: Denies palpitation, chest discomfort or lower extremity swelling Gastrointestinal:  Denies nausea, heartburn or change in bowel habits Skin: Denies abnormal skin rashes Lymphatics: Denies new lymphadenopathy or easy bruising Neurological:Denies numbness, tingling or new weaknesses Behavioral/Psych: Mood is stable, no new changes  All other systems were reviewed with the patient and are negative.  MEDICAL HISTORY:  Past Medical History:  Diagnosis Date   Arthritis    knee   Breast cancer (HCC)    bilateral, right breast mass, cancer both breast, x1 chemo  a week ago.   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   Wears glasses    Wears partial dentures    top    SURGICAL HISTORY: Past Surgical History:  Procedure Laterality Date   ABDOMINAL HYSTERECTOMY     APPENDECTOMY     BREAST BIOPSY      BREAST LUMPECTOMY Bilateral 2016   BREAST LUMPECTOMY WITH RADIOACTIVE SEED AND SENTINEL LYMPH NODE BIOPSY Right 01/15/2015   Procedure: RIGHT BREAST LUMPECTOMY WITH RADIOACTIVE SEED AND SENTINEL LYMPH NODE BIOPSY;  Surgeon: Haywood Ingram, MD;  Location: [facility] OR;  Service: General;  Laterality: Right;   BREAST LUMPECTOMY WITH RADIOACTIVE SEED LOCALIZATION Left 01/15/2015   Procedure: LEFT BREAST LUMPECTOMY WITH RADIOACTIVE SEED LOCALIZATION;  Surgeon: Haywood Ingram, MD;  Location: [facility] OR;  Service: General;  Laterality: Left;   CARPAL TUNNEL RELEASE Left    CHOLECYSTECTOMY     COLONOSCOPY     ENDOBRONCHIAL ULTRASOUND Bilateral 07/28/2014   Procedure: ENDOBRONCHIAL ULTRASOUND;  Surgeon: Robert S Byrum, MD;  Location: [facility];  Service: Cardiopulmonary;  Laterality: Bilateral;   PORT-A-CATH REMOVAL Left 01/15/2015   Procedure: REMOVAL PORT-A-CATH;  Surgeon: Haywood Ingram, MD;  Location: [facility] OR;  Service: General;  Laterality: Left;   PORTACATH PLACEMENT N/A 06/30/2014   Procedure: INSERTION PORT-A-CATH WITH ULTRA SOUND ON STANDBY;  Surgeon: Haywood Ingram, MD;  Location: [facility];  Service: General;  Laterality: N/A;   TONSILLECTOMY        I have reviewed the social history and family history with the patient and they are unchanged from previous note.  ALLERGIES:  is allergic to aspirin, codeine, meloxicam, nitrofurantoin, other, pyridium [phenazopyridine hcl], sulfa antibiotics, tetracyclines & related, and tape.  MEDICATIONS:  Current Outpatient Medications  Medication Sig Dispense Refill   Calcium Carb-Cholecalciferol (CALCIUM 600+D3) 600-400 MG-UNIT TABS Take 1 tablet by mouth 2 (two) times daily.     clotrimazole-betamethasone (LOTRISONE) cream      cyanocobalamin (,VITAMIN B-12,) 1000 MCG/ML injection Inject 1,000 mcg into the muscle every 30 (thirty) days.      diclofenac (VOLTAREN) 50 MG EC tablet Take 50 mg by mouth 2 (two) times daily.     levETIRAcetam (KEPPRA)  500 MG tablet Take 1 tablet by mouth 2 (two) times daily.     oxaprozin (DAYPRO) 600 MG tablet Take 1 tablet by mouth 2 (two) times daily. Reported on 07/27/2015     phenytoin (DILANTIN) 100 MG ER capsule Take 1 tablet by mouth 4 (four) times daily. On  Sat , Sun, Tues, Wed, and  Thursdays    -  Take   100mg  QID.     sodium chloride (OCEAN) 0.65 % SOLN nasal spray Place 1 spray into both nostrils as needed for congestion.     tamoxifen (NOLVADEX) 20 MG tablet Take 1 tablet (20 mg total) by mouth daily. 90 tablet 3   Tetrahydrozoline HCl ([employer] EYE DROPS OP) Apply 1 drop to eye 3 (three) times daily.     No current facility-administered medications for this visit.    PHYSICAL EXAMINATION: ECOG PERFORMANCE STATUS: 0 - Asymptomatic  Vitals:   09/04/20 1215  BP: 125/69  Pulse: 67  Resp: 17  Temp: 98.1 F (36.7 C)  SpO2: 99%    GENERAL:alert, no distress and comfortable SKIN: skin color, texture, turgor are normal, no rashes or significant lesions EYES: normal, Conjunctiva are pink and non-injected, sclera clear  NECK: supple, thyroid normal size, non-tender, without nodularity LYMPH:  no palpable lymphadenopathy in the cervical, axillary  LUNGS: clear to auscultation and percussion with normal breathing effort HEART: regular rate & rhythm and no murmurs and no lower extremity edema ABDOMEN:abdomen soft, non-tender and normal bowel sounds (+) Surgical incision healed well, S/p cholecystectomy Musculoskeletal:no cyanosis of digits and no clubbing  NEURO: alert & oriented x 3 with fluent speech, no focal motor/sensory deficits BREAST: S/p b/l lumpectomy: Surgical incision healed well with mild scar tissue. No palpable mass, nodules or adenopathy bilaterally. Breast exam benign.    LABORATORY DATA:  I have reviewed the data as listed CBC Latest Ref Rng & Units 09/06/2019 09/05/2018 01/22/2018  WBC 4.0 - 10.5 K/uL 4.9 4.9 4.9  Hemoglobin 12.0 - 15.0 g/dL 13.0 12.9 12.6  Hematocrit 36.0 -  46.0 % 39.5 38.7 38.9  Platelets 150 - 400 K/uL 210 195 210     CMP Latest Ref Rng & Units 09/06/2019 09/05/2018 01/22/2018  Glucose 70 - 99 mg/dL 79 76 80  BUN 8 - 23 mg/dL 16 16 15  Creatinine 0.44 - 1.00 mg/dL 0.75 0.74 0.73  Sodium 135 - 145 mmol/L 142 139 142  Potassium 3.5 - 5.1 mmol/L 4.4 4.2 4.3  Chloride 98 - 111 mmol/L 103 104 105  CO2 22 - 32 mmol/L 30 27 28  Calcium 8.9 - 10.3 mg/dL 9.1 8.5(L) 9.1  Total Protein 6.5 - 8.1 g/dL 6.7 6.6 6.8  Total Bilirubin 0.3 - 1.2 mg/dL 0.2(L) 0.2(L) <0.2(L)  Alkaline Phos 38 -   126 U/L 71 72 68  AST 15 - 41 U/L 13(L) 13(L) 14(L)  ALT 0 - 44 U/L _0 RADIOGRAPHIC STUDIES: I have personally reviewed the radiological images as listed and agreed with the findings in the report. No results found.   ASSESSMENT & PLAN:  Kristin Pennington is a 75 y.o. female with    1. 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 She was diagnosed in 06/2014 with left breast DCIS. She is s/p neoadjuvant AC-T, B/l lumpectomies, B/l adjuvant radiation.  -The left breast DCIS had no residual tumor on the surgical sample, it was ER/PR strongly positive. -She completed 5 years of Tamoxifen 04/2015-06/2020. She is currently on surveillance alone.  -She is clinically doing well. Lab reviewed, her CBC and CMP are within normal limits. Her physical exam and her 06/2020 mammogram were unremarkable. There is no clinical concern for recurrence. -Continue surveillance. She is 5 years from cancer diagnosis and her risk of recurrence is minimal. Continue yearly mammograms, next in 06/2020.  -I discussed given distance, she can continue her surveillance with her PCP and Gyn. I will f/u with her as needed in the future. She is agreeable.     2. Bone Health  -Had Bone density in 2017 with Dr. Megan Salon her Orthopedist,  patient notes results were normal  -She will continue to monitor with her Orthopedist in [place]. -She is taking Calcium and Vit D. Will continue    3. Comorbidities: Cervical spine stenosis, History of seizure, B/l leg venous insufficiency, Chronic Back Pain after a fall in 08/2016 -She neurosurgeon Dr. Vertell Limber and other physicians.  [place] on back due to equilibrium imbalance in 08/2016. Manageable with aspirin, Ambulated independently but has cane or walker if needed. Stable.     4.  Genetic Testing negative      Plan -Request latest labs from PCP office Dr Eben Burow -Continue yearly mammograms and surveillance with Gyn, PCP -F/u with me as needed in the future   No problem-specific Assessment & Plan notes found for this encounter.   No orders of the defined types were placed in this encounter.  All questions were answered. The patient knows to call the clinic with any problems, questions or concerns. No barriers to learning was detected. The total time spent in the appointment was 20 minutes.    Truitt Merle, MD 09/04/2020   I, Joslyn Devon, am acting as scribe for Truitt Merle, MD.   I have reviewed the above documentation for accuracy and completeness, and I agree with the above.

## 2020-09-04 ENCOUNTER — Inpatient Hospital Stay: Payer: Medicare Other | Attending: Hematology | Admitting: Hematology

## 2020-09-04 ENCOUNTER — Other Ambulatory Visit: Payer: Self-pay

## 2020-09-04 ENCOUNTER — Encounter: Payer: Self-pay | Admitting: Hematology

## 2020-09-04 VITALS — BP 125/69 | HR 67 | Temp 98.1°F | Resp 17 | Ht 66.0 in | Wt 153.1 lb

## 2020-09-04 DIAGNOSIS — D0512 Intraductal carcinoma in situ of left breast: Secondary | ICD-10-CM | POA: Diagnosis not present

## 2020-09-04 DIAGNOSIS — Z853 Personal history of malignant neoplasm of breast: Secondary | ICD-10-CM | POA: Insufficient documentation

## 2020-09-04 DIAGNOSIS — Z923 Personal history of irradiation: Secondary | ICD-10-CM | POA: Diagnosis not present

## 2020-09-04 DIAGNOSIS — Z171 Estrogen receptor negative status [ER-]: Secondary | ICD-10-CM | POA: Insufficient documentation

## 2020-09-04 DIAGNOSIS — Z79899 Other long term (current) drug therapy: Secondary | ICD-10-CM | POA: Diagnosis not present

## 2020-09-04 DIAGNOSIS — Z86000 Personal history of in-situ neoplasm of breast: Secondary | ICD-10-CM | POA: Diagnosis present

## 2020-09-04 DIAGNOSIS — Z170 Estrogen receptor positive status [ER+]: Secondary | ICD-10-CM | POA: Insufficient documentation

## 2020-09-04 DIAGNOSIS — Z9221 Personal history of antineoplastic chemotherapy: Secondary | ICD-10-CM | POA: Insufficient documentation

## 2020-09-04 DIAGNOSIS — C50211 Malignant neoplasm of upper-inner quadrant of right female breast: Secondary | ICD-10-CM
